# Patient Record
Sex: Male | Born: 1955
Health system: Southern US, Community
[De-identification: ages and names within clinical notes are randomized; demographics above are authoritative.]

## PROBLEM LIST (undated history)

## (undated) DIAGNOSIS — F1990 Other psychoactive substance use, unspecified, uncomplicated: Secondary | ICD-10-CM

## (undated) DIAGNOSIS — I219 Acute myocardial infarction, unspecified: Secondary | ICD-10-CM

## (undated) DIAGNOSIS — E119 Type 2 diabetes mellitus without complications: Secondary | ICD-10-CM

## (undated) DIAGNOSIS — I2510 Atherosclerotic heart disease of native coronary artery without angina pectoris: Secondary | ICD-10-CM

---

## 2019-01-02 ENCOUNTER — Other Ambulatory Visit: Payer: Self-pay

## 2019-01-02 DIAGNOSIS — Z20822 Contact with and (suspected) exposure to covid-19: Secondary | ICD-10-CM

## 2019-01-04 LAB — NOVEL CORONAVIRUS, NAA: SARS-CoV-2, NAA: NOT DETECTED

## 2019-02-04 ENCOUNTER — Inpatient Hospital Stay (HOSPITAL_COMMUNITY)
Admission: EM | Admit: 2019-02-04 | Discharge: 2019-02-06 | DRG: 247 | Disposition: A | Discharge status: Home / Self Care | Payer: Self-pay | Attending: Cardiovascular Disease | Admitting: Cardiovascular Disease

## 2019-02-04 ENCOUNTER — Emergency Department (HOSPITAL_COMMUNITY): Payer: Self-pay

## 2019-02-04 ENCOUNTER — Other Ambulatory Visit: Payer: Self-pay

## 2019-02-04 ENCOUNTER — Encounter (HOSPITAL_COMMUNITY): Payer: Self-pay | Admitting: Emergency Medicine

## 2019-02-04 ENCOUNTER — Ambulatory Visit (HOSPITAL_COMMUNITY)
Admission: EM | Admit: 2019-02-04 | Discharge: 2019-02-04 | Disposition: A | Discharge status: Home / Self Care | Payer: Self-pay | Attending: Family Medicine | Admitting: Family Medicine

## 2019-02-04 DIAGNOSIS — Z955 Presence of coronary angioplasty implant and graft: Secondary | ICD-10-CM

## 2019-02-04 DIAGNOSIS — I214 Non-ST elevation (NSTEMI) myocardial infarction: Principal | ICD-10-CM | POA: Diagnosis present

## 2019-02-04 DIAGNOSIS — Z20828 Contact with and (suspected) exposure to other viral communicable diseases: Secondary | ICD-10-CM | POA: Diagnosis present

## 2019-02-04 DIAGNOSIS — E119 Type 2 diabetes mellitus without complications: Secondary | ICD-10-CM

## 2019-02-04 DIAGNOSIS — F1490 Cocaine use, unspecified, uncomplicated: Secondary | ICD-10-CM | POA: Diagnosis present

## 2019-02-04 DIAGNOSIS — Z23 Encounter for immunization: Secondary | ICD-10-CM

## 2019-02-04 DIAGNOSIS — E785 Hyperlipidemia, unspecified: Secondary | ICD-10-CM

## 2019-02-04 DIAGNOSIS — E1165 Type 2 diabetes mellitus with hyperglycemia: Secondary | ICD-10-CM | POA: Diagnosis present

## 2019-02-04 DIAGNOSIS — Z8249 Family history of ischemic heart disease and other diseases of the circulatory system: Secondary | ICD-10-CM

## 2019-02-04 DIAGNOSIS — R079 Chest pain, unspecified: Secondary | ICD-10-CM

## 2019-02-04 HISTORY — DX: Type 2 diabetes mellitus without complications: E11.9

## 2019-02-04 HISTORY — DX: Atherosclerotic heart disease of native coronary artery without angina pectoris: I25.10

## 2019-02-04 HISTORY — DX: Other psychoactive substance use, unspecified, uncomplicated: F19.90

## 2019-02-04 HISTORY — DX: Acute myocardial infarction, unspecified: I21.9

## 2019-02-04 LAB — BASIC METABOLIC PANEL
Anion gap: 11 (ref 5–15)
BUN: 12 mg/dL (ref 8–23)
CO2: 23 mmol/L (ref 22–32)
Calcium: 9.6 mg/dL (ref 8.9–10.3)
Chloride: 101 mmol/L (ref 98–111)
Creatinine, Ser: 1.15 mg/dL (ref 0.61–1.24)
GFR calc Af Amer: >60 mL/min (ref >60)
GFR calc non Af Amer: >60 mL/min (ref >60)
Glucose, Bld: 289 mg/dL — ABNORMAL HIGH (ref 70–99)
Potassium: 3.8 mmol/L (ref 3.5–5.1)
Sodium: 135 mmol/L (ref 135–145)

## 2019-02-04 LAB — CBC
HCT: 43.8 % (ref 39.0–52.0)
Hemoglobin: 13.5 g/dL (ref 13.0–17.0)
MCH: 27.8 pg (ref 26.0–34.0)
MCHC: 30.8 g/dL (ref 30.0–36.0)
MCV: 90.1 fL (ref 80.0–100.0)
Platelets: 207 10*3/uL (ref 150–400)
RBC: 4.86 MIL/uL (ref 4.22–5.81)
RDW: 11 % — ABNORMAL LOW (ref 11.5–15.5)
WBC: 8.7 10*3/uL (ref 4.0–10.5)
nRBC: 0 % (ref 0.0–0.2)

## 2019-02-04 LAB — TROPONIN I (HIGH SENSITIVITY)
Troponin I (High Sensitivity): >27000 ng/L (ref <18)
Troponin I (High Sensitivity): >27000 ng/L (ref <18)

## 2019-02-04 LAB — CBG MONITORING, ED: Glucose-Capillary: 224 mg/dL — ABNORMAL HIGH (ref 70–99)

## 2019-02-04 MED ORDER — ASPIRIN 300 MG RE SUPP: 300.0000 mg | RECTAL | Status: DC

## 2019-02-04 MED ORDER — ASPIRIN 81 MG PO CHEW
324.0000 mg | CHEWABLE_TABLET | Freq: Once | ORAL | Status: AC
  Administered 2019-02-04: 324 mg via ORAL
  Filled 2019-02-04: qty 4

## 2019-02-04 MED ORDER — HEPARIN BOLUS VIA INFUSION
4000.0000 [IU] | Freq: Once | INTRAVENOUS | Status: AC
  Administered 2019-02-04: 4000 [IU] via INTRAVENOUS
  Filled 2019-02-04: qty 4000

## 2019-02-04 MED ORDER — HEPARIN (PORCINE) 25000 UT/250ML-% IV SOLN
1200.0000 [IU]/h | INTRAVENOUS | Status: DC
  Administered 2019-02-04: 1000 [IU]/h via INTRAVENOUS
  Filled 2019-02-04 (×2): qty 250

## 2019-02-04 MED ORDER — ATORVASTATIN CALCIUM 80 MG PO TABS
80.0000 mg | ORAL_TABLET | Freq: Every day | ORAL | Status: DC
  Administered 2019-02-05 (×2): 80 mg via ORAL
  Filled 2019-02-04 (×2): qty 1

## 2019-02-04 MED ORDER — INSULIN ASPART 100 UNIT/ML ~~LOC~~ SOLN
0.0000 [IU] | Freq: Every day | SUBCUTANEOUS | Status: DC
  Administered 2019-02-05 (×2): 2 [IU] via SUBCUTANEOUS

## 2019-02-04 MED ORDER — NITROGLYCERIN IN D5W 200-5 MCG/ML-% IV SOLN
0.0000 ug/min | INTRAVENOUS | Status: DC
  Administered 2019-02-04: 5 ug/min via INTRAVENOUS
  Filled 2019-02-04: qty 250

## 2019-02-04 MED ORDER — INSULIN ASPART 100 UNIT/ML ~~LOC~~ SOLN
0.0000 [IU] | Freq: Three times a day (TID) | SUBCUTANEOUS | Status: DC
  Administered 2019-02-05: 5 [IU] via SUBCUTANEOUS
  Administered 2019-02-06: 3 [IU] via SUBCUTANEOUS

## 2019-02-04 MED ORDER — ASPIRIN 81 MG PO CHEW: 324.0000 mg | CHEWABLE_TABLET | ORAL | Status: DC

## 2019-02-04 MED ORDER — SODIUM CHLORIDE 0.9% FLUSH
3.0000 mL | Freq: Once | INTRAVENOUS | Status: AC
  Administered 2019-02-04: 3 mL via INTRAVENOUS

## 2019-02-04 MED ORDER — ASPIRIN EC 81 MG PO TBEC
81.0000 mg | DELAYED_RELEASE_TABLET | Freq: Every day | ORAL | Status: DC
  Administered 2019-02-06: 81 mg via ORAL
  Filled 2019-02-04: qty 1

## 2019-02-04 MED ORDER — ONDANSETRON HCL 4 MG/2ML IJ SOLN: 4.0000 mg | Freq: Four times a day (QID) | INTRAMUSCULAR | Status: DC | PRN

## 2019-02-04 MED ORDER — METOPROLOL TARTRATE 12.5 MG HALF TABLET
12.5000 mg | ORAL_TABLET | Freq: Two times a day (BID) | ORAL | Status: DC
  Administered 2019-02-05 – 2019-02-06 (×2): 12.5 mg via ORAL
  Filled 2019-02-04 (×2): qty 1

## 2019-02-04 MED ORDER — INSULIN GLARGINE 100 UNIT/ML ~~LOC~~ SOLN
10.0000 [IU] | Freq: Every day | SUBCUTANEOUS | Status: DC
  Administered 2019-02-05 (×2): 10 [IU] via SUBCUTANEOUS
  Filled 2019-02-04 (×4): qty 0.1

## 2019-02-04 MED ORDER — ACETAMINOPHEN 325 MG PO TABS: 650.0000 mg | ORAL_TABLET | ORAL | Status: DC | PRN

## 2019-02-04 NOTE — ED Provider Notes (Addendum)
[facility] EMERGENCY DEPARTMENT Provider Note   CSN: [id number] Arrival date & time: 02/04/19  1851     History   Chief Complaint Chief Complaint  Patient presents with  . Chest Pain    HPI Adam [name] is a 63 y.o. male who presents to the emergency department from UC with a chief complaint of chest pain.  The patient reports constant, nonradiating, 6/10, pressure-like  pain in the middle and left side of his chest that has been constant since onset yesterday.  The chest pain has been accompanied by shortness of breath and palpitations.  He reports that he was diaphoretic most of yesterday, but this resolved today.    He denies any recent episodes of chest pain with exertion.  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  His chest pain was not worse while he was active and at his job. He notes the only thing that has made his pain improve was drinking water and he has noticed more burping and belching.  He denies neck pain, arm pain, nausea, vomiting, dizziness, lightheadedness, fever, chills, cough, abdominal pain, back pain, or leg swelling.  He reports that he has no medical problems and does not take any medications on a daily basis, but also states that he has not been seen by a medical provider and evaluated in over 40 years.  He has a family history of cardiovascular disease with his mother and brother.  He reports that his brother had several stents placed in his late 12s.     The history is provided by the patient. No language interpreter was used.    History reviewed. No pertinent past medical history.  Patient Active Problem List   Diagnosis Date Noted  . Type 2 diabetes mellitus ([place]) 02/04/2019  . NSTEMI (non-ST elevated myocardial infarction) ([place]) 02/04/2019    History reviewed. No pertinent surgical history.      Home Medications    Prior to Admission medications   Not on File    Family History History reviewed.  No pertinent family history.  Social History Social History   Tobacco Use  . Smoking status: Never Smoker  . Smokeless tobacco: Never Used  Substance Use Topics  . Alcohol use: Not on file  . Drug use: Not on file     Allergies   Patient has no known allergies.   Review of Systems Review of Systems  Constitutional: Negative for appetite change, chills and fever.  Eyes: Negative for visual disturbance.  Respiratory: Positive for shortness of breath.   Cardiovascular: Positive for chest pain and palpitations. Negative for leg swelling.  Gastrointestinal: Negative for abdominal pain, diarrhea, nausea and vomiting.  Genitourinary: Negative for dysuria.  Musculoskeletal: Negative for arthralgias, back pain, myalgias, neck pain and neck stiffness.  Skin: Negative for rash.  Allergic/Immunologic: Negative for immunocompromised state.  Neurological: Negative for dizziness, seizures, syncope, weakness, numbness and headaches.  Psychiatric/Behavioral: Negative for confusion.   Physical Exam Updated Vital Signs BP 124/72 (BP Location: Right Arm)   Pulse 61   Temp 98.4 F (36.9 C) (Oral)   Resp 18   Ht 6\' 3"  (1.905 m)   Wt 80.7 kg   SpO2 98%   BMI 22.25 kg/m   Physical Exam Vitals signs and nursing note reviewed.  Constitutional:      General: He is not in acute distress.    Appearance: He is well-developed. He is not ill-appearing, toxic-appearing or diaphoretic.  Comments: Well appearing  HENT:     Head: Normocephalic.  Eyes:     Conjunctiva/sclera: Conjunctivae normal.  Neck:     Musculoskeletal: Normal range of motion and neck supple.     Vascular: No carotid bruit.  Cardiovascular:     Rate and Rhythm: Normal rate and regular rhythm.     Pulses: Normal pulses.     Heart sounds: Normal heart sounds. No murmur. No friction rub. No gallop.   Pulmonary:     Effort: Pulmonary effort is normal. No respiratory distress.     Breath sounds: No stridor. No wheezing,  rhonchi or rales.  Chest:     Chest wall: No tenderness.  Abdominal:     General: There is no distension.     Palpations: Abdomen is soft.     Comments: Abdomen is obese, but soft and nondistended.  Musculoskeletal:     Right lower leg: No edema.     Left lower leg: No edema.  Skin:    General: Skin is warm and dry.  Neurological:     Mental Status: He is alert.  Psychiatric:        Behavior: Behavior normal.      ED Treatments / Results  Labs (all labs ordered are listed, but only abnormal results are displayed) Labs Reviewed  BASIC METABOLIC PANEL - Abnormal; Notable for the following components:      Result Value   Glucose, Bld 289 (*)    All other components within normal limits  CBC - Abnormal; Notable for the following components:   RDW 11.0 (*)    All other components within normal limits  CBG MONITORING, ED - Abnormal; Notable for the following components:   Glucose-Capillary 224 (*)    All other components within normal limits  TROPONIN I (HIGH SENSITIVITY) - Abnormal; Notable for the following components:   Troponin I (High Sensitivity) >27,000 (*)    All other components within normal limits  TROPONIN I (HIGH SENSITIVITY) - Abnormal; Notable for the following components:   Troponin I (High Sensitivity) >27,000 (*)    All other components within normal limits  SARS CORONAVIRUS 2 (TAT 6-24 HRS)  HEPARIN LEVEL (UNFRACTIONATED)  CBC  HEMOGLOBIN A1C  LIPID PANEL  MAGNESIUM  HEPATIC FUNCTION PANEL  HIV ANTIBODY (ROUTINE TESTING W REFLEX)    EKG None  Radiology Dg Chest 2 View  Result Date: 02/04/2019 CLINICAL DATA:  Chest pain EXAM: CHEST - 2 VIEW COMPARISON:  None. FINDINGS: The heart size and mediastinal contours are within normal limits. Both lungs are clear. The visualized skeletal structures are unremarkable. IMPRESSION: No active cardiopulmonary disease. Electronically Signed   By: Jonna Clark M.D.   On: 02/04/2019 19:52    Procedures .Critical  Care Performed by: Barkley Boards, PA-C Authorized by: Barkley Boards, PA-C   Critical care provider statement:    Critical care time (minutes):  35   Critical care time was exclusive of:  Separately billable procedures and treating other patients and teaching time   Critical care was necessary to treat or prevent imminent or life-threatening deterioration of the following conditions:  Cardiac failure   Critical care was time spent personally by me on the following activities:  Development of treatment plan with patient or surrogate, discussions with consultants, obtaining history from patient or surrogate, examination of patient, evaluation of patient's response to treatment, ordering and performing treatments and interventions, ordering and review of laboratory studies, ordering and review of radiographic studies, re-evaluation  of patient's condition and pulse oximetry   (including critical care time)  Medications Ordered in ED Medications  nitroGLYCERIN 50 mg in dextrose 5 % 250 mL (0.2 mg/mL) infusion (10 mcg/min Intravenous Rate/Dose Change 02/05/19 0007)  heparin ADULT infusion 100 units/mL (25000 units/24450mL sodium chloride 0.45%) (1,000 Units/hr Intravenous New Bag/Given 02/04/19 2302)  insulin glargine (LANTUS) injection 10 Units (10 Units Subcutaneous Given 02/05/19 0003)  insulin aspart (novoLOG) injection 0-15 Units (has no administration in time range)  insulin aspart (novoLOG) injection 0-5 Units (2 Units Subcutaneous Given 02/05/19 0003)  aspirin chewable tablet 324 mg (324 mg Oral Not Given 02/04/19 2324)    Or  aspirin suppository 300 mg ( Rectal See Alternative 02/04/19 2324)  aspirin EC tablet 81 mg (has no administration in time range)  acetaminophen (TYLENOL) tablet 650 mg (has no administration in time range)  ondansetron (ZOFRAN) injection 4 mg (has no administration in time range)  metoprolol tartrate (LOPRESSOR) tablet 12.5 mg (has no administration in time range)   atorvastatin (LIPITOR) tablet 80 mg (80 mg Oral Given 02/05/19 0002)  sodium chloride flush (NS) 0.9 % injection 3 mL (3 mLs Intravenous Given 02/04/19 2306)  aspirin chewable tablet 324 mg (324 mg Oral Given 02/04/19 2300)  heparin bolus via infusion 4,000 Units (4,000 Units Intravenous Bolus from Bag 02/04/19 2302)     Initial Impression / Assessment and Plan / ED Course  I have reviewed the triage vital signs and the nursing notes.  Pertinent labs & imaging results that were available during my care of the patient were reviewed by me and considered in my medical decision making (see chart for details).        63 year old male with no known past medical history, but the patient reports that he has not been evaluated by a medical provider in more than 40 years who presents with chest pain and shortness of breath since yesterday.   Vital signs are stable. EKG without acute evidence of ischemia.  Chest x-ray is unremarkable. Initial troponin is >27K.  Discussed the patient with Dr. Adela LankFloyd, attending physician.  Will initiate aspirin, nitroglycerin infusion, and heparin as presentation is concerning for NSTEMI.  Consulted cardiology and spoke with Dr. Okey Dupreose who will accept the patient for admission.  Glucose is elevated at 289 with normal anion gap and bicarb.  No electrolyte deficiencies. COVID-19 test is pending.  00:01-Patient recheck. Repeat troponin >27K. He is NPO at this time. No change in chest pain- continues to endorse 6/10 chest pain.    The patient appears reasonably stabilized for admission considering the current resources, flow, and capabilities available in the ED at this time, and I doubt any other [facility] requiring further screening and/or treatment in the ED prior to admission.  Final Clinical Impressions(s) / ED Diagnoses   Final diagnoses:  NSTEMI (non-ST elevated myocardial infarction) [facility](HCC)    ED Discharge Orders    None       Christropher Gintz A, PA-C 02/05/19 0012     Jenna Routzahn A, PA-C 02/05/19 0014    Melene PlanFloyd, Dan, DO 02/05/19 1512

## 2019-02-04 NOTE — Progress Notes (Signed)
ANTICOAGULATION CONSULT NOTE  Pharmacy Consult for Heparin Indication: chest pain/ACS  No Known Allergies  Patient Measurements: Height: 6\' 3"  (190.5 cm) Weight: 178 lb (80.7 kg) IBW/kg (Calculated) : 84.5 Heparin Dosing Weight: 80.7 kg  Vital Signs: Temp: 98.4 F (36.9 C) (11/10 2212) Temp Source: Oral (11/10 2212) BP: 124/72 (11/10 2212) Pulse Rate: 61 (11/10 2212)  Labs: Recent Labs    02/04/19 1905  HGB 13.5  HCT 43.8  PLT 207  CREATININE 1.15  TROPONINIHS >27,000*    Estimated Creatinine Clearance: 74.1 mL/min (by C-G formula based on SCr of 1.15 mg/dL).   Medical History: History reviewed. No pertinent past medical history.  Medications:  Scheduled:  . aspirin  324 mg Oral Once  . heparin  4,000 Units Intravenous Once  . sodium chloride flush  3 mL Intravenous Once    Assessment: Patient is a 44 yom that presents to the ED with chest pain. After obtaining labs the patient's Troponin was found to be >27,000. Pharmacy has been consulted to dose heparin in this patient for ACS. Goal of Therapy:  Heparin level 0.3-0.7 units/ml Monitor platelets by anticoagulation protocol: Yes   Plan:  - Heparin bolus of 4000 units IV x 1 dose - Heparin Drip @ 1000 units/hr  - Heparin level in 6 hours (with AM labs)  - Monitor for s/s of bleeding and CBC daily   Duanne Limerick PharmD. BCPS  02/04/2019,10:49 PM

## 2019-02-04 NOTE — ED Notes (Signed)
PA made aware of pt troponin

## 2019-02-04 NOTE — ED Triage Notes (Signed)
Pt sent down from UC with c/o chest pain in the center of his chest , non radiating , no n/v

## 2019-02-04 NOTE — ED Notes (Signed)
Patient is being discharged from the Urgent [facility] and sent to the Emergency Department via significant other. Per Dr. Meda Coffee, patient is stable but in need of higher level of care due to chest pain. Patient is aware and verbalizes understanding of plan of care.  Vitals:   02/04/19 1825  BP: (!) 153/90  Pulse: 81  Resp: 18  Temp: 98.2 F (36.8 C)  SpO2: 100%

## 2019-02-04 NOTE — ED Triage Notes (Addendum)
Pt c/o chest pain, starting last night, states it also feels like theres a fluttering in his chest. Denies medical problems. States he was sweating a lot last night and also c/o sob right now. Pain 5/10

## 2019-02-05 ENCOUNTER — Other Ambulatory Visit (HOSPITAL_COMMUNITY): Payer: Self-pay

## 2019-02-05 ENCOUNTER — Encounter (HOSPITAL_COMMUNITY)
Admission: EM | Disposition: A | Discharge status: Home / Self Care | Payer: Self-pay | Source: Home / Self Care | Attending: Cardiovascular Disease

## 2019-02-05 ENCOUNTER — Other Ambulatory Visit: Payer: Self-pay

## 2019-02-05 DIAGNOSIS — I2510 Atherosclerotic heart disease of native coronary artery without angina pectoris: Secondary | ICD-10-CM

## 2019-02-05 DIAGNOSIS — I214 Non-ST elevation (NSTEMI) myocardial infarction: Secondary | ICD-10-CM

## 2019-02-05 DIAGNOSIS — E119 Type 2 diabetes mellitus without complications: Secondary | ICD-10-CM

## 2019-02-05 HISTORY — PX: CORONARY STENT INTERVENTION: CATH118234

## 2019-02-05 HISTORY — PX: CORONARY BALLOON ANGIOPLASTY: CATH118233

## 2019-02-05 HISTORY — PX: CORONARY THROMBECTOMY: CATH118304

## 2019-02-05 HISTORY — PX: LEFT HEART CATH AND CORONARY ANGIOGRAPHY: CATH118249

## 2019-02-05 LAB — CBC
HCT: 38.7 % — ABNORMAL LOW (ref 39.0–52.0)
Hemoglobin: 12 g/dL — ABNORMAL LOW (ref 13.0–17.0)
MCH: 28 pg (ref 26.0–34.0)
MCHC: 31 g/dL (ref 30.0–36.0)
MCV: 90.2 fL (ref 80.0–100.0)
Platelets: 191 10*3/uL (ref 150–400)
RBC: 4.29 MIL/uL (ref 4.22–5.81)
RDW: 11.2 % — ABNORMAL LOW (ref 11.5–15.5)
WBC: 7.9 10*3/uL (ref 4.0–10.5)
nRBC: 0 % (ref 0.0–0.2)

## 2019-02-05 LAB — HIV ANTIBODY (ROUTINE TESTING W REFLEX): HIV Screen 4th Generation wRfx: NONREACTIVE

## 2019-02-05 LAB — POCT ACTIVATED CLOTTING TIME
Activated Clotting Time: 241 seconds
Activated Clotting Time: 263 seconds

## 2019-02-05 LAB — RAPID URINE DRUG SCREEN, HOSP PERFORMED
Amphetamines: NOT DETECTED
Barbiturates: NOT DETECTED
Benzodiazepines: NOT DETECTED
Cocaine: POSITIVE — AB
Opiates: NOT DETECTED
Tetrahydrocannabinol: NOT DETECTED

## 2019-02-05 LAB — CBG MONITORING, ED: Glucose-Capillary: 208 mg/dL — ABNORMAL HIGH (ref 70–99)

## 2019-02-05 LAB — SARS CORONAVIRUS 2 (TAT 6-24 HRS): SARS Coronavirus 2: NEGATIVE

## 2019-02-05 LAB — GLUCOSE, CAPILLARY: Glucose-Capillary: 219 mg/dL — ABNORMAL HIGH (ref 70–99)

## 2019-02-05 LAB — HEPARIN LEVEL (UNFRACTIONATED): Heparin Unfractionated: 0.13 IU/mL — ABNORMAL LOW (ref 0.30–0.70)

## 2019-02-05 SURGERY — LEFT HEART CATH AND CORONARY ANGIOGRAPHY
Anesthesia: LOCAL

## 2019-02-05 MED ORDER — TICAGRELOR 90 MG PO TABS
ORAL_TABLET | ORAL | Status: AC
  Filled 2019-02-05: qty 2

## 2019-02-05 MED ORDER — FENTANYL CITRATE (PF) 100 MCG/2ML IJ SOLN
INTRAMUSCULAR | Status: DC | PRN
  Administered 2019-02-05: 25 ug via INTRAVENOUS

## 2019-02-05 MED ORDER — SODIUM CHLORIDE 0.9 % WEIGHT BASED INFUSION
1.0000 mL/kg/h | INTRAVENOUS | Status: DC
  Administered 2019-02-05: 1 mL/kg/h via INTRAVENOUS

## 2019-02-05 MED ORDER — TIROFIBAN HCL IN NACL 5-0.9 MG/100ML-% IV SOLN
INTRAVENOUS | Status: AC
  Filled 2019-02-05: qty 100

## 2019-02-05 MED ORDER — SODIUM CHLORIDE 0.9% FLUSH
3.0000 mL | Freq: Two times a day (BID) | INTRAVENOUS | Status: DC
  Administered 2019-02-05: 3 mL via INTRAVENOUS

## 2019-02-05 MED ORDER — SODIUM CHLORIDE 0.9% FLUSH: 3.0000 mL | INTRAVENOUS | Status: DC | PRN

## 2019-02-05 MED ORDER — TICAGRELOR 90 MG PO TABS
ORAL_TABLET | ORAL | Status: DC | PRN
  Administered 2019-02-05: 180 mg via ORAL

## 2019-02-05 MED ORDER — TICAGRELOR 90 MG PO TABS
90.0000 mg | ORAL_TABLET | Freq: Two times a day (BID) | ORAL | Status: DC
  Administered 2019-02-05 – 2019-02-06 (×2): 90 mg via ORAL
  Filled 2019-02-05 (×2): qty 1

## 2019-02-05 MED ORDER — SODIUM CHLORIDE 0.9 % IV SOLN: 250.0000 mL | INTRAVENOUS | Status: DC | PRN

## 2019-02-05 MED ORDER — TIROFIBAN (AGGRASTAT) BOLUS VIA INFUSION
INTRAVENOUS | Status: DC | PRN
  Administered 2019-02-05: 2017.5 ug via INTRAVENOUS

## 2019-02-05 MED ORDER — HEPARIN (PORCINE) IN NACL 1000-0.9 UT/500ML-% IV SOLN
INTRAVENOUS | Status: AC
  Filled 2019-02-05: qty 1000

## 2019-02-05 MED ORDER — FENTANYL CITRATE (PF) 100 MCG/2ML IJ SOLN
INTRAMUSCULAR | Status: AC
  Filled 2019-02-05: qty 2

## 2019-02-05 MED ORDER — INFLUENZA VAC SPLIT QUAD 0.5 ML IM SUSY
0.5000 mL | PREFILLED_SYRINGE | INTRAMUSCULAR | Status: AC
  Administered 2019-02-06: 0.5 mL via INTRAMUSCULAR
  Filled 2019-02-05: qty 0.5

## 2019-02-05 MED ORDER — TIROFIBAN HCL IN NACL 5-0.9 MG/100ML-% IV SOLN
0.1500 ug/kg/min | INTRAVENOUS | Status: AC
  Administered 2019-02-05: 0.15 ug/kg/min via INTRAVENOUS
  Filled 2019-02-05 (×3): qty 100

## 2019-02-05 MED ORDER — VERAPAMIL HCL 2.5 MG/ML IV SOLN
INTRAVENOUS | Status: DC | PRN
  Administered 2019-02-05: 10 mL via INTRA_ARTERIAL

## 2019-02-05 MED ORDER — VERAPAMIL HCL 2.5 MG/ML IV SOLN
INTRAVENOUS | Status: AC
  Filled 2019-02-05: qty 2

## 2019-02-05 MED ORDER — ASPIRIN 81 MG PO CHEW
81.0000 mg | CHEWABLE_TABLET | ORAL | Status: AC
  Administered 2019-02-05: 81 mg via ORAL
  Filled 2019-02-05: qty 1

## 2019-02-05 MED ORDER — HEPARIN (PORCINE) IN NACL 1000-0.9 UT/500ML-% IV SOLN
INTRAVENOUS | Status: DC | PRN
  Administered 2019-02-05 (×2): 500 mL

## 2019-02-05 MED ORDER — MIDAZOLAM HCL 2 MG/2ML IJ SOLN
INTRAMUSCULAR | Status: DC | PRN
  Administered 2019-02-05: 1 mg via INTRAVENOUS

## 2019-02-05 MED ORDER — HEPARIN SODIUM (PORCINE) 1000 UNIT/ML IJ SOLN
INTRAMUSCULAR | Status: AC
  Filled 2019-02-05: qty 1

## 2019-02-05 MED ORDER — LIDOCAINE HCL (PF) 1 % IJ SOLN
INTRAMUSCULAR | Status: DC | PRN
  Administered 2019-02-05: 2 mL

## 2019-02-05 MED ORDER — HEPARIN BOLUS VIA INFUSION
2000.0000 [IU] | Freq: Once | INTRAVENOUS | Status: AC
  Administered 2019-02-05: 2000 [IU] via INTRAVENOUS
  Filled 2019-02-05: qty 2000

## 2019-02-05 MED ORDER — HEPARIN SODIUM (PORCINE) 1000 UNIT/ML IJ SOLN
INTRAMUSCULAR | Status: DC | PRN
  Administered 2019-02-05: 5000 [IU] via INTRAVENOUS
  Administered 2019-02-05: 3000 [IU] via INTRAVENOUS
  Administered 2019-02-05: 4000 [IU] via INTRAVENOUS

## 2019-02-05 MED ORDER — ZOLPIDEM TARTRATE 5 MG PO TABS
5.0000 mg | ORAL_TABLET | Freq: Every evening | ORAL | Status: DC | PRN
  Administered 2019-02-05: 5 mg via ORAL
  Filled 2019-02-05: qty 1

## 2019-02-05 MED ORDER — LIDOCAINE HCL (PF) 1 % IJ SOLN
INTRAMUSCULAR | Status: AC
  Filled 2019-02-05: qty 30

## 2019-02-05 MED ORDER — SODIUM CHLORIDE 0.9 % IV SOLN
INTRAVENOUS | Status: AC | PRN
  Administered 2019-02-05: 250 mL via INTRAVENOUS

## 2019-02-05 MED ORDER — MIDAZOLAM HCL 2 MG/2ML IJ SOLN
INTRAMUSCULAR | Status: AC
  Filled 2019-02-05: qty 2

## 2019-02-05 MED ORDER — TIROFIBAN HCL IN NACL 5-0.9 MG/100ML-% IV SOLN
INTRAVENOUS | Status: AC | PRN
  Administered 2019-02-05: 0.15 ug/kg/min via INTRAVENOUS

## 2019-02-05 MED ORDER — SODIUM CHLORIDE 0.9 % WEIGHT BASED INFUSION: 3.0000 mL/kg/h | INTRAVENOUS | Status: DC

## 2019-02-05 MED ORDER — SODIUM CHLORIDE 0.9 % IV SOLN: INTRAVENOUS | Status: AC

## 2019-02-05 SURGICAL SUPPLY — 17 items
BALLN SAPPHIRE 2.5X15 (BALLOONS) ×2
BALLOON SAPPHIRE 2.5X15 (BALLOONS) IMPLANT
CATH EXTRAC PRONTO 5.5F 138CM (CATHETERS) ×1 IMPLANT
CATH INFINITI JR4 5F (CATHETERS) ×1 IMPLANT
CATH OPTITORQUE TIG 4.0 5F (CATHETERS) ×1 IMPLANT
CATH VISTA GUIDE 6FR XB3.5 (CATHETERS) ×1 IMPLANT
DEVICE RAD COMP TR BAND LRG (VASCULAR PRODUCTS) ×1 IMPLANT
GLIDESHEATH SLEND SS 6F .021 (SHEATH) ×1 IMPLANT
GUIDEWIRE INQWIRE 1.5J.035X260 (WIRE) IMPLANT
INQWIRE 1.5J .035X260CM (WIRE) ×2
KIT ENCORE 26 ADVANTAGE (KITS) ×1 IMPLANT
KIT HEART LEFT (KITS) ×2 IMPLANT
PACK CARDIAC CATHETERIZATION (CUSTOM PROCEDURE TRAY) ×2 IMPLANT
STENT RESOLUTE ONYX 3.0X22 (Permanent Stent) ×1 IMPLANT
TRANSDUCER W/STOPCOCK (MISCELLANEOUS) ×2 IMPLANT
TUBING CIL FLEX 10 FLL-RA (TUBING) ×2 IMPLANT
WIRE ASAHI PROWATER 180CM (WIRE) ×1 IMPLANT

## 2019-02-05 NOTE — Progress Notes (Signed)
Adam [name] for aggrastat Indication: chest pain/ACS  No Known Allergies  Patient Measurements: Height: 6\' 3"  (190.5 cm) Weight: 178 lb (80.7 kg) IBW/kg (Calculated) : 84.5 Heparin Dosing Weight: 80.7 kg  Vital Signs: BP: 89/65 (11/11 1515) Pulse Rate: 59 (11/11 1515)  Labs: Recent Labs    02/04/19 1905 02/04/19 2200 02/05/19 0500  HGB 13.5  --  12.0*  HCT 43.8  --  38.7*  PLT 207  --  191  HEPARINUNFRC  --   --  0.13*  CREATININE 1.15  --   --   TROPONINIHS >27,000* >27,000*  --     Estimated Creatinine Clearance: 74.1 mL/min (by C-G formula based on SCr of 1.15 mg/dL).   Medical History: History reviewed. No pertinent past medical history.  Medications:  Scheduled:  . [MAR Hold] aspirin  324 mg Oral NOW   Or  . [MAR Hold] aspirin  300 mg Rectal NOW  . [MAR Hold] aspirin EC  81 mg Oral Daily  . [MAR Hold] atorvastatin  80 mg Oral q1800  . [MAR Hold] insulin aspart  0-15 Units Subcutaneous TID WC  . [MAR Hold] insulin aspart  0-5 Units Subcutaneous QHS  . [MAR Hold] insulin glargine  10 Units Subcutaneous QHS  . [MAR Hold] metoprolol tartrate  12.5 mg Oral BID  . sodium chloride flush  3 mL Intravenous Q12H    Assessment: Patient is a 8 yom that presents to the ED with chest pain. After obtaining labs the patient's Troponin was found to be >27,000. Pharmacy has been consulted to dose heparin in this patient for ACS.  Underwent cardiac cath 11/11 finding severe 3 vessel dx with DES placed in pLCx. Plan for aggrastat for 12 hours after to improve distal flow. Hgb 12, plt 191. No s/sx of bleeding.   Goal of Therapy:  Monitor platelets by anticoagulation protocol: Yes   Plan:  -Continue tirofiban 0.15 mcg/kg/hr for 12 hours total post-cath (stop time entered).  -Monitor CBC, and for s/sx of bleeding.  Antonietta Jewel, PharmD, BCCCP Clinical Pharmacist  Phone: [phone]  Please check AMION for all [place] phone  numbers After 10:00 PM, call [place] [phone]

## 2019-02-05 NOTE — H&P (View-Only) (Signed)
Progress Note  Patient Name: Adam [name] Date of Encounter: 02/05/2019  Primary Cardiologist: No primary care provider on file.   Subjective   No chest pain this morning. Remains on nitro gtt.   Inpatient Medications    Scheduled Meds: . aspirin  324 mg Oral NOW   Or  . aspirin  300 mg Rectal NOW  . aspirin EC  81 mg Oral Daily  . atorvastatin  80 mg Oral q1800  . insulin aspart  0-15 Units Subcutaneous TID WC  . insulin aspart  0-5 Units Subcutaneous QHS  . insulin glargine  10 Units Subcutaneous QHS  . metoprolol tartrate  12.5 mg Oral BID   Continuous Infusions: . heparin 1,200 Units/hr (02/05/19 0702)  . nitroGLYCERIN 10 mcg/min (02/05/19 0734)   PRN Meds: acetaminophen, ondansetron (ZOFRAN) IV   Vital Signs    Vitals:   02/05/19 0330 02/05/19 0415 02/05/19 0730 02/05/19 0732  BP: 90/70 97/64 (!) 84/61 (!) 84/56  Pulse: 60 70 66 63  Resp: (!) 24 (!) 22  20  Temp:      TempSrc:      SpO2: 97% 94% 97% 91%  Weight:      Height:       No intake or output data in the 24 hours ending 02/05/19 0743 Last 3 Weights 02/04/2019  Weight (lbs) 178 lb  Weight (kg) 80.74 kg      Telemetry    SR - Personally Reviewed  ECG    No new tracing this morning.   Physical Exam  [name], sitting up in bed.  GEN: No acute distress.   Neck: No JVD Cardiac: RRR, no murmurs, rubs, or gallops.  Respiratory: Clear to auscultation bilaterally. GI: Soft, nontender, non-distended  MS: No edema; No deformity. Neuro:  Nonfocal  Psych: Normal affect   Labs    High Sensitivity Troponin:   Recent Labs  Lab 02/04/19 1905 02/04/19 2200  TROPONINIHS >27,000* >27,000*      Chemistry Recent Labs  Lab 02/04/19 1905  NA 135  K 3.8  CL 101  CO2 23  GLUCOSE 289*  BUN 12  CREATININE 1.15  CALCIUM 9.6  GFRNONAA >60  GFRAA >60  ANIONGAP 11     Hematology Recent Labs  Lab 02/04/19 1905 02/05/19 0500  WBC 8.7 7.9  RBC 4.86 4.29  HGB 13.5 12.0*  HCT  43.8 38.7*  MCV 90.1 90.2  MCH 27.8 28.0  MCHC 30.8 31.0  RDW 11.0* 11.2*  PLT 207 191    BNPNo results for input(s): BNP, PROBNP in the last 168 hours.   DDimer No results for input(s): DDIMER in the last 168 hours.   Radiology    Dg Chest 2 View  Result Date: 02/04/2019 CLINICAL DATA:  Chest pain EXAM: CHEST - 2 VIEW COMPARISON:  None. FINDINGS: The heart size and mediastinal contours are within normal limits. Both lungs are clear. The visualized skeletal structures are unremarkable. IMPRESSION: No active cardiopulmonary disease. Electronically Signed   By: Jonna Clark M.D.   On: 02/04/2019 19:52    Cardiac Studies   N/a   Patient Profile     63 y.o. male with no reported PMH who presented with chest pain and found to have a NSTEMI.   Assessment & Plan    1. NSTEMI: HsT peaked at > 27000 x2. Reports his chest pain started on Tuesday and lingered until he presented to the ED yesterday. Remains on IV heparin and nitro. Currently pain  free. Planned for cath today.  -- The patient understands that risks included but are not limited to stroke (1 in 1000), death (1 in 24), kidney failure [usually temporary] (1 in 500), bleeding (1 in 200), allergic reaction [possibly serious] (1 in 200).  -- on ASA, statin, BB (currently held with soft bps)  2. Elevated glucose: denies any hx of DM. Though does not see a PCP.  -- SSI while inpatient -- lipids pending  3. Cocaine use: reports occasional use. Last used about a week ago.  -- check UDS   For questions or updates, please contact [place] Please consult [email or web address] for contact info under   Signed, Reino Bellis, NP  02/05/2019, 7:43 AM    Patient seen, examined. Available data reviewed. Agree with findings, assessment, and plan as outlined by Reino Bellis, NP.  See full H&P by the fellow overnight.  The patient is chest pain-free this morning.  He had a prolonged episode of chest pain leading to his admission,  approximately 24 hours.  He has had a large infarct by cardiac enzymes with high-sensitivity troponin greater than 27,000x2.  I agree that cardiac catheterization is indicated.  We discussed risks, indications, and alternatives as outlined above.  We discussed his need for regular medical care, treatment of diabetes, medical therapy, and complete cessation of drug use.  I tried to call his son but did not get an answer.  The patient is posted for cardiac catheterization and possible PCI today.  All of his questions are answered.  Sherren Mocha, M.D. 02/05/2019 8:39 AM

## 2019-02-05 NOTE — Progress Notes (Signed)
ANTICOAGULATION CONSULT NOTE  Pharmacy Consult for Heparin Indication: chest pain/ACS  No Known Allergies  Patient Measurements: Height: 6\' 3"  (190.5 cm) Weight: 178 lb (80.7 kg) IBW/kg (Calculated) : 84.5 Heparin Dosing Weight: 80.7 kg  Vital Signs: Temp: 98.4 F (36.9 C) (11/10 2212) Temp Source: Oral (11/10 2212) BP: 97/64 (11/11 0415) Pulse Rate: 70 (11/11 0415)  Labs: Recent Labs    02/04/19 1905 02/04/19 2200 02/05/19 0500  HGB 13.5  --  12.0*  HCT 43.8  --  38.7*  PLT 207  --  191  HEPARINUNFRC  --   --  0.13*  CREATININE 1.15  --   --   TROPONINIHS >27,000* >27,000*  --     Estimated Creatinine Clearance: 74.1 mL/min (by C-G formula based on SCr of 1.15 mg/dL).   Medical History: History reviewed. No pertinent past medical history.  Medications:  Scheduled:  . aspirin  324 mg Oral NOW   Or  . aspirin  300 mg Rectal NOW  . aspirin EC  81 mg Oral Daily  . atorvastatin  80 mg Oral q1800  . heparin  2,000 Units Intravenous Once  . insulin aspart  0-15 Units Subcutaneous TID WC  . insulin aspart  0-5 Units Subcutaneous QHS  . insulin glargine  10 Units Subcutaneous QHS  . metoprolol tartrate  12.5 mg Oral BID    Assessment: Patient is a 9 yom that presents to the ED with chest pain. After obtaining labs the patient's Troponin was found to be >27,000. Pharmacy has been consulted to dose heparin in this patient for ACS.  11/11 AM update:  Heparin level low No issues per RN  Goal of Therapy:  Heparin level 0.3-0.7 units/ml Monitor platelets by anticoagulation protocol: Yes   Plan:  -Heparin 2000 units bolus -Inc heparin drip to 1200 units/hr -Re-check heparin level at [place], PharmD, [place] Pharmacist Phone: [phone]

## 2019-02-05 NOTE — ED Notes (Signed)
Paper consent for cardiac cath signed by pt and witnessed.

## 2019-02-05 NOTE — Interval H&P Note (Signed)
History and Physical Interval Note:  02/05/2019 10:37 AM  Adam [name]  has presented today for surgery, with the diagnosis of nonstemi.  The various methods of treatment have been discussed with the patient and family. After consideration of risks, benefits and other options for treatment, the patient has consented to  Procedure(s): LEFT HEART CATH AND CORONARY ANGIOGRAPHY (N/A)  PERCUTANEOUS CORONARY INTERVENTION  as a surgical intervention.  The patient's history has been reviewed, patient examined, no change in status, stable for surgery.  I have reviewed the patient's chart and labs.  Questions were answered to the patient's satisfaction.   Cath Lab Visit (complete for each Cath Lab visit)  Clinical Evaluation Leading to the Procedure:   ACS: Yes.    Non-ACS:    Anginal Classification: CCS IV  Anti-ischemic medical therapy: Minimal Therapy (1 class of medications)  Non-Invasive Test Results: No non-invasive testing performed  Prior CABG: No previous CABG    Glenetta Hew

## 2019-02-05 NOTE — ED Notes (Signed)
Spoke with cath lab, ready for pt at this time.

## 2019-02-05 NOTE — Progress Notes (Addendum)
Progress Note  Patient Name: Adam [name] Date of Encounter: 02/05/2019  Primary Cardiologist: No primary care provider on file.   Subjective   No chest pain this morning. Remains on nitro gtt.   Inpatient Medications    Scheduled Meds: . aspirin  324 mg Oral NOW   Or  . aspirin  300 mg Rectal NOW  . aspirin EC  81 mg Oral Daily  . atorvastatin  80 mg Oral q1800  . insulin aspart  0-15 Units Subcutaneous TID WC  . insulin aspart  0-5 Units Subcutaneous QHS  . insulin glargine  10 Units Subcutaneous QHS  . metoprolol tartrate  12.5 mg Oral BID   Continuous Infusions: . heparin 1,200 Units/hr (02/05/19 0702)  . nitroGLYCERIN 10 mcg/min (02/05/19 0734)   PRN Meds: acetaminophen, ondansetron (ZOFRAN) IV   Vital Signs    Vitals:   02/05/19 0330 02/05/19 0415 02/05/19 0730 02/05/19 0732  BP: 90/70 97/64 (!) 84/61 (!) 84/56  Pulse: 60 70 66 63  Resp: (!) 24 (!) 22  20  Temp:      TempSrc:      SpO2: 97% 94% 97% 91%  Weight:      Height:       No intake or output data in the 24 hours ending 02/05/19 0743 Last 3 Weights 02/04/2019  Weight (lbs) 178 lb  Weight (kg) 80.74 kg      Telemetry    SR - Personally Reviewed  ECG    No new tracing this morning.   Physical Exam  [name], sitting up in bed.  GEN: No acute distress.   Neck: No JVD Cardiac: RRR, no murmurs, rubs, or gallops.  Respiratory: Clear to auscultation bilaterally. GI: Soft, nontender, non-distended  MS: No edema; No deformity. Neuro:  Nonfocal  Psych: Normal affect   Labs    High Sensitivity Troponin:   Recent Labs  Lab 02/04/19 1905 02/04/19 2200  TROPONINIHS >27,000* >27,000*      Chemistry Recent Labs  Lab 02/04/19 1905  NA 135  K 3.8  CL 101  CO2 23  GLUCOSE 289*  BUN 12  CREATININE 1.15  CALCIUM 9.6  GFRNONAA >60  GFRAA >60  ANIONGAP 11     Hematology Recent Labs  Lab 02/04/19 1905 02/05/19 0500  WBC 8.7 7.9  RBC 4.86 4.29  HGB 13.5 12.0*  HCT  43.8 38.7*  MCV 90.1 90.2  MCH 27.8 28.0  MCHC 30.8 31.0  RDW 11.0* 11.2*  PLT 207 191    BNPNo results for input(s): BNP, PROBNP in the last 168 hours.   DDimer No results for input(s): DDIMER in the last 168 hours.   Radiology    Dg Chest 2 View  Result Date: 02/04/2019 CLINICAL DATA:  Chest pain EXAM: CHEST - 2 VIEW COMPARISON:  None. FINDINGS: The heart size and mediastinal contours are within normal limits. Both lungs are clear. The visualized skeletal structures are unremarkable. IMPRESSION: No active cardiopulmonary disease. Electronically Signed   By: Jonna Clark M.D.   On: 02/04/2019 19:52    Cardiac Studies   N/a   Patient Profile     63 y.o. male with no reported PMH who presented with chest pain and found to have a NSTEMI.   Assessment & Plan    1. NSTEMI: HsT peaked at > 27000 x2. Reports his chest pain started on Tuesday and lingered until he presented to the ED yesterday. Remains on IV heparin and nitro. Currently pain  free. Planned for cath today.  -- The patient understands that risks included but are not limited to stroke (1 in 1000), death (1 in 24), kidney failure [usually temporary] (1 in 500), bleeding (1 in 200), allergic reaction [possibly serious] (1 in 200).  -- on ASA, statin, BB (currently held with soft bps)  2. Elevated glucose: denies any hx of DM. Though does not see a PCP.  -- SSI while inpatient -- lipids pending  3. Cocaine use: reports occasional use. Last used about a week ago.  -- check UDS   For questions or updates, please contact [place] Please consult [email or web address] for contact info under   Signed, Reino Bellis, NP  02/05/2019, 7:43 AM    Patient seen, examined. Available data reviewed. Agree with findings, assessment, and plan as outlined by Reino Bellis, NP.  See full H&P by the fellow overnight.  The patient is chest pain-free this morning.  He had a prolonged episode of chest pain leading to his admission,  approximately 24 hours.  He has had a large infarct by cardiac enzymes with high-sensitivity troponin greater than 27,000x2.  I agree that cardiac catheterization is indicated.  We discussed risks, indications, and alternatives as outlined above.  We discussed his need for regular medical care, treatment of diabetes, medical therapy, and complete cessation of drug use.  I tried to call his son but did not get an answer.  The patient is posted for cardiac catheterization and possible PCI today.  All of his questions are answered.  Sherren Mocha, M.D. 02/05/2019 8:39 AM

## 2019-02-05 NOTE — H&P (Signed)
Cardiology History & Physical    Patient ID: Adam [name] MRN: [id number], DOB/AGE: 1955-05-19   Admit date: 02/04/2019  Primary Physician: Patient, No Pcp Per Primary Cardiologist: None  Patient Profile    Adam [name] is a 63 year old man with no known medical problems who presents with chest pain since yesterday.  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 Chest pain did not change with exertion but did have worsening dyspnea. No pleuritic or positional. Denies n/v, cough, diarrhea, fever, chills.  He does admit to cocaine use occasionally, about 20 times over the past year, most recently 1 week ago. He has never had chest pain before this episode and is adamant that he last used cocaine 1 week ago.   On arrival here had ongoing chest pain. ECG showed non-specific ST-T changes, not particularly ischemic. Troponin however is > 27,000 x 2. Repeat ECG shows developing TWI in anterolateral leads. Started on nitro ggt and is now chest pain free. He has received full dose aspirin and heparin.   Past Medical History   History reviewed. No pertinent past medical history.   History reviewed. No pertinent surgical history.   Allergies: No Known Allergies  Home Medications    No home medications  Family History    Brother had CAD, multiple stents starting in mid-late 21s. Mother had murmur and hole in her heart, told to avoid pregnancy but had 9 kids.  Social History    No history of tobacco use. Cocaine about 20 times over the past year, most recently 1 week ago. No significant alcohol use.  Single.   Review of Systems    A comprehensive review of 10 systems was performed with pertinent positive and negative findings noted in the HPI.   Physical Exam    BP 124/72 (BP Location: Right Arm)   Pulse 61   Temp 98.4 F  (36.9 C) (Oral)   Resp 18   Ht 6\' 3"  (1.905 m)   Wt 80.7 kg   SpO2 98%   BMI 22.25 kg/m  General: Alert, NAD HEENT: Normal  Neck: No bruits or JVD. Lungs:  Resp regular and unlabored, CTA bilaterally. Heart: Regular rhythm, no s3, s4, or murmurs. Abdomen: Soft, non-tender, non-distended, BS +.  Extremities: Warm. No clubbing, cyanosis or edema. DP/PT/Radials 2+ and equal bilaterally. Psych: Normal affect. Neuro: Alert and oriented. No gross focal deficits. No abnormal movements.  Labs    HS-troponin: > 27,000  Lab Results  Component Value Date   WBC 8.7 02/04/2019   HGB 13.5 02/04/2019   HCT 43.8 02/04/2019   MCV 90.1 02/04/2019   PLT 207 02/04/2019    Recent Labs  Lab 02/04/19 1905  NA 135  K 3.8  CL 101  CO2 23  BUN 12  CREATININE 1.15  CALCIUM 9.6  GLUCOSE 289*     Radiology Studies    Dg Chest 2 View  Result Date: 02/04/2019 CLINICAL DATA:  Chest pain EXAM: CHEST - 2 VIEW COMPARISON:  None. FINDINGS: The heart size and mediastinal contours are within normal limits. Both lungs are clear. The visualized skeletal structures are unremarkable. IMPRESSION: No active cardiopulmonary disease. Electronically Signed   By: Prudencio Pair M.D.   On: 02/04/2019 19:52    ECG & Cardiac Imaging    ECG: NSR, single PAC, LAD, non-specific ST-T abnormality, cannot  rule out inferior infarct, though interpretation somewhat limited by baseline artifact - personally reviewed.  Assessment & Plan    NSTEMI: Typical presentation for ACS. I think this is a type 1 event, and not specifically cocaine-related. He is now chest pain free with nitro ggt. Troponin is > 27,000.  Does have risk factors with a family history and and undiagnosed diabetes.  - Keep NPO for left heart cath in the morning - Monitor on telemetry, serial ECGs - TTE, A1c, lipid profile - Full dose ASA, then 81mg  daily - Agree with heparin, with bolus - Start nitroglycerin infusion, titrate for chest pain relief  - Atorvastatin 80mg  - Metoprolol 12.5mg  BID - Can stop trending troponin as he has 2 at maximum reported value  Type 2 DM:  New diagnosis based on initial glucose of 289. No prior screening. - Check A1c - Start Lantus and SSI - Good candidate for SGLT2i-Metformin combination therapy at discharge. - Consider ACEi this admission, but will hold off for NTG titration for now.   Nutrition: NPO for procedure DVT ppx: therapeutic heparin Advanced Care Planning: Full Code   Signed, , MD 02/05/2019, 12:07 AM

## 2019-02-05 NOTE — ED Notes (Signed)
Cardiology at bedside.

## 2019-02-05 NOTE — Progress Notes (Signed)
TR  BAND REMOVAL  LOCATION: Right Radial  DEFLATED PER PROTOCOL:    Yes  TIME BAND OFF / DRESSING APPLIED:    1610pm Covered with gauze ,tegaderm and coban   SITE UPON ARRIVAL:    Level 0  SITE AFTER BAND REMOVAL:    Level 0  CIRCULATION SENSATION AND MOVEMENT:    Within Normal Limits  Yes  COMMENTS:   Pt denies any discomfort .  Care instruction given to pt.

## 2019-02-06 ENCOUNTER — Encounter (HOSPITAL_COMMUNITY): Payer: Self-pay | Admitting: Cardiology

## 2019-02-06 ENCOUNTER — Telehealth: Payer: Self-pay | Admitting: Cardiovascular Disease

## 2019-02-06 ENCOUNTER — Inpatient Hospital Stay (HOSPITAL_COMMUNITY): Payer: Self-pay

## 2019-02-06 ENCOUNTER — Other Ambulatory Visit: Payer: Self-pay

## 2019-02-06 DIAGNOSIS — E785 Hyperlipidemia, unspecified: Secondary | ICD-10-CM

## 2019-02-06 DIAGNOSIS — I361 Nonrheumatic tricuspid (valve) insufficiency: Secondary | ICD-10-CM

## 2019-02-06 DIAGNOSIS — I340 Nonrheumatic mitral (valve) insufficiency: Secondary | ICD-10-CM

## 2019-02-06 LAB — CBC
HCT: 38.5 % — ABNORMAL LOW (ref 39.0–52.0)
Hemoglobin: 12.2 g/dL — ABNORMAL LOW (ref 13.0–17.0)
MCH: 28 pg (ref 26.0–34.0)
MCHC: 31.7 g/dL (ref 30.0–36.0)
MCV: 88.5 fL (ref 80.0–100.0)
Platelets: 186 10*3/uL (ref 150–400)
RBC: 4.35 MIL/uL (ref 4.22–5.81)
RDW: 11 % — ABNORMAL LOW (ref 11.5–15.5)
WBC: 9.6 10*3/uL (ref 4.0–10.5)
nRBC: 0 % (ref 0.0–0.2)

## 2019-02-06 LAB — BASIC METABOLIC PANEL
Anion gap: 10 (ref 5–15)
BUN: 10 mg/dL (ref 8–23)
CO2: 22 mmol/L (ref 22–32)
Calcium: 9.2 mg/dL (ref 8.9–10.3)
Chloride: 107 mmol/L (ref 98–111)
Creatinine, Ser: 1.15 mg/dL (ref 0.61–1.24)
GFR calc Af Amer: >60 mL/min (ref >60)
GFR calc non Af Amer: >60 mL/min (ref >60)
Glucose, Bld: 189 mg/dL — ABNORMAL HIGH (ref 70–99)
Potassium: 3.8 mmol/L (ref 3.5–5.1)
Sodium: 139 mmol/L (ref 135–145)

## 2019-02-06 LAB — ECHOCARDIOGRAM COMPLETE
Height: 75 in
Weight: 3286.4 oz

## 2019-02-06 LAB — HEMOGLOBIN A1C
Hgb A1c MFr Bld: 8.4 % — ABNORMAL HIGH (ref 4.8–5.6)
Mean Plasma Glucose: 194.38 mg/dL

## 2019-02-06 LAB — GLUCOSE, CAPILLARY
Glucose-Capillary: 165 mg/dL — ABNORMAL HIGH (ref 70–99)
Glucose-Capillary: 234 mg/dL — ABNORMAL HIGH (ref 70–99)

## 2019-02-06 MED ORDER — NITROGLYCERIN 0.4 MG SL SUBL: 0.4000 mg | SUBLINGUAL_TABLET | SUBLINGUAL | 2 refills | Status: DC | PRN

## 2019-02-06 MED ORDER — LIVING WELL WITH DIABETES BOOK
Freq: Once | Status: DC
  Filled 2019-02-06: qty 1

## 2019-02-06 MED ORDER — METOPROLOL TARTRATE 25 MG PO TABS: 12.5000 mg | ORAL_TABLET | Freq: Two times a day (BID) | ORAL | 2 refills | Status: DC

## 2019-02-06 MED ORDER — ASPIRIN 81 MG PO TBEC: 81.0000 mg | DELAYED_RELEASE_TABLET | Freq: Every day | ORAL | 3 refills | Status: AC

## 2019-02-06 MED ORDER — TICAGRELOR 90 MG PO TABS: 90.0000 mg | ORAL_TABLET | Freq: Two times a day (BID) | ORAL | 2 refills | Status: DC

## 2019-02-06 MED ORDER — ATORVASTATIN CALCIUM 80 MG PO TABS: 80.0000 mg | ORAL_TABLET | Freq: Every day | ORAL | 3 refills | Status: DC

## 2019-02-06 MED FILL — ASPIRIN 81MG ADULT LOW STRE: 81 | 90 days supply | Qty: 90 | Fill #0

## 2019-02-06 MED FILL — METOPROLOL TARTRATE 25 MG T: 25 | 90 days supply | Qty: 90 | Fill #0

## 2019-02-06 MED FILL — ATORVASTATIN CALCIUM 80 MG: 80 | 90 days supply | Qty: 90 | Fill #0

## 2019-02-06 MED FILL — BRILINTA 90 MG TABLET: 90 | 30 days supply | Qty: 60 | Fill #0

## 2019-02-06 MED FILL — NITROGLYCERIN 0.4 MG TAB SL: 0.4 | 8 days supply | Qty: 25 | Fill #0

## 2019-02-06 NOTE — Telephone Encounter (Signed)
Patient's son, Pilar Plate, returning Dr. Antionette Char call.

## 2019-02-06 NOTE — Telephone Encounter (Signed)
**Note De-identified Adam [name] Obfuscation** The pt is being discharged from the hospital today. We will call him tomorrow 

## 2019-02-06 NOTE — Discharge Summary (Addendum)
Discharge Summary    Patient ID: Adam [name],  MRN: [id number], DOB/AGE: 03-Oct-1955 63 y.o.  Admit date: 02/04/2019 Discharge date: 02/06/2019  Primary Care Provider: Patient, No Pcp Per Primary Cardiologist: Sherren Mocha, MD  Discharge Diagnoses    Principal Problem:   NSTEMI (non-ST elevated myocardial infarction) [facility]) Active Problems:   Type 2 diabetes mellitus ([place])   Hyperlipidemia   Allergies No Known Allergies  Diagnostic Studies/Procedures    Cath: 02/05/19   CULPRIT LESION: Prox Cx to Mid Cx lesion is 95% stenosed. Dist Cx lesion is 60% stenosed.  A drug-eluting stent was successfully placed covering both lesions, using a STENT RESOLUTE ONYX 3.0X22.-Postdilated to 3.3 mm  Post intervention, there is a 0% residual stenosis.  --  LESION SEGMENT #2: LPAV lesion is 100% stenosed - heavy concentrated thrombus  1 passive aspiration thrombectomy followed by Balloon angioplasty was performed using a BALLOON SAPPHIRE 2.5X15.  Post intervention, there is a 60% residual stenosis -still significant thrombus burden  LPDA lesion is 95% stenosed -unable to tell the vessel size, very difficult to wire. Plan is to treat with Aggrastat to clear up thrombus burden and improve flow.  ---------  Colon Flattery LAD to Prox LAD lesion is 60% stenosed with 1st Diag lesion is 50% stenosed. - bifurcation lesion .  Prox RCA (very small caliber, non-dominant) lesion is 95% stenosed.  ---  The left ventricular systolic function is normal. The left ventricular ejection fraction is 50-55% by visual estimate.  Dist Cx lesion is 60% stenosed.   SUMMARY  Severe 3 vessel disease with culprit lesion being 95% heavily thrombotic ulcerated stenosis in the proximal Circumflex with thrombotic occlusion of the proximal Left PDA, there is 60% bifurcation LAD-1stDiag stenosis (plan medical management), and 95% proximal small nondominant RCA (medical management) ? Successful DES PCI of pLCx  using resolute Onyx DES 3.0 mm x 22 mm (3.3 mm) ? Aspiration Thrombectomy and PTCA only of ostial beat PDA -> suboptimal result due to significant thrombus burden  Preserved EF with inferior apical hypokinesis.  Mildly elevated LVEDP   RECOMMENDATIONS  Return to nursing unit for TR band removal.  We will run Aggrastat for 12 hours to improve distal flow  Plan for now is to treat based on symptomatology.  Would consider LAD-1STDiag bifurcation intervention if symptoms warrant in the future.  Aggressive risk factor modification --statin, (nonselective beta-blocker given recent tox screen)  Counseling to avoid substance abuse  Glenetta Hew, M.D., M.S. Interventional Cardiologist   Diagnostic Dominance: Left  Intervention    _____________   History of Present Illness     Adam [name] is a 63 yo male with no reported PMH but does not see a PCP. 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 Chest pain did not change with exertion but did have worsening dyspnea. No pleuritic or positional. Denied n/v, cough, diarrhea, fever, chills.  He did admit to cocaine use occasionally, about 20 times over the past year, most recently 1 week ago. He had never had chest pain before this episode and was adamant that he last used cocaine 1 week ago.   On arrival to the ED he had ongoing chest pain. ECG showed non-specific ST-T changes, not particularly ischemic. Troponin however is > 27,000 x 2. Repeat ECG showed developing TWI in anterolateral leads. Started on nitro ggt and was chest pain free at  the time of assessment. He has received full dose aspirin and heparin.   Hospital Course     He was placed on IV heparin and taken to the cath lab. Underwent cardiac cath noted above with severe 3v disease. Culprit lesion being a 95% thrombotic ulcerated stenosis in the  pLCx treated with PCI/DESx1. Additional underwent aspiration thrombectomy and PTCA of the ostial PDA, but only received sub optimal result 2/2 to thrombus burden. Also with 95% lesion in pRCA but very small caliber and non-dominant vessel, therefore was not intervened on. EF noted at 50-55% via LV gram with inferior apical hypokinesis. Placed on DAPT with ASA/Brilinta. Received aggrastat for 12 hours post cath. Placed on high dose statin. Lipid panel and Hgb A1c were ordered but unfortunately not drawn. CBGs were elevated in the 200s during admission. Seen by the diabetes educator. Placed on low dose metoprolol with blood pressures tolerating. Follow up echo pending at the time of discharge. He was counseled in the need for cessation of cocaine use with the start of his BB therapy. Did have HgbA1c drawn prior to discharge to be followed up on at outpatient appt. Seen by CM to help establish with PCP prior to discharge.   General: Well developed, well nourished, male appearing in no acute distress. Head: Normocephalic, atraumatic.  Neck: Supple without bruits, JVD. Lungs:  Resp regular and unlabored, CTA. Heart: RRR, S1, S2, no S3, S4, or murmur; no rub. Abdomen: Soft, non-tender, non-distended with normoactive bowel sounds. No hepatomegaly. No rebound/guarding. No obvious abdominal masses. Extremities: No clubbing, cyanosis, edema. Distal pedal pulses are 2+ bilaterally. Right radial cath site stable without bruising or hematoma Neuro: Alert and oriented X 3. Moves all extremities spontaneously. Psych: Normal affect.  Adam [name] was seen by Dr. Excell Seltzerooper and determined stable for discharge home. Follow up in the office has been arranged. Medications are listed below.   _____________  Discharge Vitals Blood pressure 102/70, pulse (!) 59, temperature 98.6 F (37 C), temperature source Oral, resp. rate 18, height 6\' 3"  (1.905 m), weight 93.2 kg, SpO2 99 %.  Filed Weights   02/04/19 2212 02/06/19  0558  Weight: 80.7 kg 93.2 kg    Labs & Radiologic Studies    CBC Recent Labs    02/05/19 0500 02/06/19 0350  WBC 7.9 9.6  HGB 12.0* 12.2*  HCT 38.7* 38.5*  MCV 90.2 88.5  PLT 191 186   Basic Metabolic Panel Recent Labs    45/40/9810/01/13 1905 02/06/19 0350  NA 135 139  K 3.8 3.8  CL 101 107  CO2 23 22  GLUCOSE 289* 189*  BUN 12 10  CREATININE 1.15 1.15  CALCIUM 9.6 9.2   Liver Function Tests No results for input(s): AST, ALT, ALKPHOS, BILITOT, PROT, ALBUMIN in the last 72 hours. No results for input(s): LIPASE, AMYLASE in the last 72 hours. Cardiac Enzymes No results for input(s): CKTOTAL, CKMB, CKMBINDEX, TROPONINI in the last 72 hours. BNP Invalid input(s): POCBNP D-Dimer No results for input(s): DDIMER in the last 72 hours. Hemoglobin A1C No results for input(s): HGBA1C in the last 72 hours. Fasting Lipid Panel No results for input(s): CHOL, HDL, LDLCALC, TRIG, CHOLHDL, LDLDIRECT in the last 72 hours. Thyroid Function Tests No results for input(s): TSH, T4TOTAL, T3FREE, THYROIDAB in the last 72 hours.  Invalid input(s): FREET3 _____________  Dg Chest 2 View  Result Date: 02/04/2019 CLINICAL DATA:  Chest pain EXAM: CHEST - 2 VIEW COMPARISON:  None. FINDINGS: The heart size and mediastinal  contours are within normal limits. Both lungs are clear. The visualized skeletal structures are unremarkable. IMPRESSION: No active cardiopulmonary disease. Electronically Signed   By: Jonna Clark M.D.   On: 02/04/2019 19:52   Disposition   Pt is being discharged home today in good condition.  Follow-up Plans & Appointments    Follow-up Information    [facility], Sharrell Ku, [place] Follow up on 02/18/2019.   Specialty: Cardiology Why: at 9am for your follow up appt.  Contact information: [address] [place] [address] [phone]          Discharge Instructions    Amb Referral to Cardiac Rehabilitation   Complete by: As directed    Diagnosis:   Coronary Stents NSTEMI     After initial evaluation and assessments completed: Virtual Based Care may be provided alone or in conjunction with Phase 2 Cardiac Rehab based on patient barriers.: Yes   Call MD for:  redness, tenderness, or signs of infection (pain, swelling, redness, odor or green/yellow discharge around incision site)   Complete by: As directed    Diet - low sodium heart healthy   Complete by: As directed    Discharge instructions   Complete by: As directed    Radial Site Care Refer to this sheet in the next few weeks. These instructions provide you with information on caring for yourself after your procedure. Your caregiver may also give you more specific instructions. Your treatment has been planned according to current medical practices, but problems sometimes occur. Call your caregiver if you have any problems or questions after your procedure. HOME CARE INSTRUCTIONS You may shower the day after the procedure.Remove the bandage (dressing) and gently wash the site with plain soap and water.Gently pat the site dry.  Do not apply powder or lotion to the site.  Do not submerge the affected site in water for 3 to 5 days.  Inspect the site at least twice daily.  Do not flex or bend the affected arm for 24 hours.  No lifting over 5 pounds (2.3 kg) for 5 days after your procedure.  Do not drive home if you are discharged the same day of the procedure. Have someone else drive you.  What to expect: Any bruising will usually fade within 1 to 2 weeks.  Blood that collects in the tissue (hematoma) may be painful to the touch. It should usually decrease in size and tenderness within 1 to 2 weeks.  SEEK IMMEDIATE MEDICAL CARE IF: You have unusual pain at the radial site.  You have redness, warmth, swelling, or pain at the radial site.  You have drainage (other than a small amount of blood on the dressing).  You have chills.  You have a fever or persistent symptoms for more than 72  hours.  You have a fever and your symptoms suddenly get worse.  Your arm becomes pale, cool, tingly, or numb.  You have heavy bleeding from the site. Hold pressure on the site.   PLEASE DO NOT MISS ANY DOSES OF YOUR BRILINTA!!!!! Also keep a log of you blood pressures and bring back to your follow up appt. Please call the office with any questions.   Patients taking blood thinners should generally stay away from medicines like ibuprofen, Advil, Motrin, naproxen, and Aleve due to risk of stomach bleeding. You may take Tylenol as directed or talk to your primary doctor about alternatives.   No lifting over 10 lbs for 4 weeks. No sexual activity for  4 weeks. You may not return to work until cleared by your cardiologist. Keep procedure site clean & dry. If you notice increased pain, swelling, bleeding or pus, call/return!  You may shower, but no soaking baths/hot tubs/pools for 1 week.   Increase activity slowly   Complete by: As directed      Discharge Medications     Medication List    TAKE these medications   aspirin 81 MG EC tablet Take 1 tablet (81 mg total) by mouth daily.   atorvastatin 80 MG tablet Commonly known as: LIPITOR Take 1 tablet (80 mg total) by mouth daily at 6 PM.   metoprolol tartrate 25 MG tablet Commonly known as: LOPRESSOR Take 0.5 tablets (12.5 mg total) by mouth 2 (two) times daily.   nitroGLYCERIN 0.4 MG SL tablet Commonly known as: Nitrostat Place 1 tablet (0.4 mg total) under the tongue every 5 (five) minutes as needed for chest pain.   ticagrelor 90 MG Tabs tablet Commonly known as: BRILINTA Take 1 tablet (90 mg total) by mouth 2 (two) times daily.        Yes                               AHA/ACC Clinical Performance & Quality Measures: 1. Aspirin prescribed? - Yes 2. ADP Receptor Inhibitor (Plavix/Clopidogrel, Brilinta/Ticagrelor or Effient/Prasugrel) prescribed (includes medically managed patients)? - Yes 3. Beta Blocker prescribed? -  Yes 4. High Intensity Statin (Lipitor 40-80mg  or Crestor 20-40mg ) prescribed? - Yes 5. EF assessed during THIS hospitalization? - Yes 6. For EF <40%, was ACEI/ARB prescribed? - Not Applicable (EF >/= 40%) 7. For EF <40%, Aldosterone Antagonist (Spironolactone or Eplerenone) prescribed? - Not Applicable (EF >/= 40%) 8. Cardiac Rehab Phase II ordered (Included Medically managed Patients)? - Yes   Outstanding Labs/Studies   Needs lipids at outpatient follow up.   Duration of Discharge Encounter   Greater than 30 minutes including physician time.  Signed, Laverda Page NP-C 02/06/2019, 10:59 AM   Patient seen, examined. Available data reviewed. Agree with findings, assessment, and plan as outlined by Laverda Page, NP.  On my exam today the patient is alert, oriented, in no distress.  Lungs are clear, heart is regular rate and rhythm without murmur gallop, abdomen soft and nontender, right radial site is clear with no hematoma.  Extremities have no edema.  Cardiac catheterization studies reviewed from yesterday.  The patient underwent successful PCI of the left circumflex.  He has no residual chest pain.  Post MI LVEF is 50 to 55%.  Current medicines are reviewed and appropriate with dual antiplatelet therapy and a high intensity statin drug.  He is also treated with a beta-blocker.  We will try to get him on an ACE inhibitor or ARB as an outpatient.  He has a new diagnosis of diabetes and will initially work on dietary changes.  He will need to establish with his primary physician to consider whether oral hypoglycemic therapy is necessary.  We discussed his return to work and post MI restrictions today.  I discussed his case with his son over the telephone.  He is medically stable for discharge today.  Tonny Bollman, M.D. 02/06/2019 2:29 PM

## 2019-02-06 NOTE — Progress Notes (Signed)
  Echocardiogram 2D Echocardiogram has been performed.  Adam [name] 02/06/2019, 9:10 AM

## 2019-02-06 NOTE — Progress Notes (Signed)
CARDIAC REHAB PHASE I   PRE:  Rate/Rhythm: 60 SR  BP:  Supine:   Sitting: 119/74  Standing:    SaO2: 98%RA  MODE:  Ambulation: 750 ft   POST:  Rate/Rhythm: 86 SR  BP:  Supine:   Sitting: 125/74  Standing:    SaO2: 96%RA 0907-1000 Pt walked 750 ft on RA with steady gait and tolerated well. MI education completed with pt who voiced understanding. Stressed importance of brilinta with stent. Reviewed NTG use, MI restrictions, heart healthy food choices and watching carbs, ex ed and CRP 2. No smart phone for virtual APP. Referred to [place] program.   Graylon Good, RN BSN  02/06/2019 9:50 AM

## 2019-02-06 NOTE — Telephone Encounter (Signed)
New Message    Pt has TOC appt with Vin Bhagat 11/24 @ 9am

## 2019-02-07 ENCOUNTER — Encounter (HOSPITAL_COMMUNITY): Payer: Self-pay

## 2019-02-07 ENCOUNTER — Ambulatory Visit (HOSPITAL_COMMUNITY)
Admission: EM | Admit: 2019-02-07 | Discharge: 2019-02-07 | Disposition: A | Discharge status: Home / Self Care | Payer: Self-pay | Attending: Family Medicine | Admitting: Family Medicine

## 2019-02-07 ENCOUNTER — Other Ambulatory Visit: Payer: Self-pay

## 2019-02-07 DIAGNOSIS — K229 Disease of esophagus, unspecified: Secondary | ICD-10-CM

## 2019-02-07 DIAGNOSIS — K2289 Other specified disease of esophagus: Secondary | ICD-10-CM

## 2019-02-07 NOTE — ED Triage Notes (Signed)
Pt presents with chest discomfort since yesterday.

## 2019-02-07 NOTE — Discharge Instructions (Addendum)
I believe that your discomfort was caused by taking your medication and not drinking enough water to wash the medication down and then lying flat afterwards. Make sure when you take your pills that you drink with a full glass of water and sit  up for at least 30 to 45 minutes to aid with digestion You may have a small amount of reflux also you can try some Maalox or Mylanta over-the-counter if needed If your chest pain returns or worsens you need to go to the ER otherwise follow-up with your cardiologist as planned.

## 2019-02-07 NOTE — Telephone Encounter (Signed)
**Note De-Identified Brielyn Bosak Obfuscation** Per the pts chart he is currently at the ER @ [facility] with c/o chest pain. We will continue to monitor his chart and will call once he is home.

## 2019-02-10 NOTE — Telephone Encounter (Signed)
Patient is coming to the 11/24 appt.

## 2019-02-10 NOTE — ED Provider Notes (Signed)
[facility]    CSN: [id number] Arrival date & time: 02/07/19  1016      History   Chief Complaint Chief Complaint  Patient presents with  . Chest Discomfort    HPI Adam [name] is a 63 y.o. male.   Patient is a 63 year old male with past medical history of CAD, diabetes, MI, NSTEMI.  He presents today with chest discomfort.  This started yesterday after taking his medication and sipping water.  He then proceeded to lay flat and felt some discomfort in his esophageal chest area.  Describes a sensation as feeling like something is stuck and mild burning.  As the day progressed his symptoms improved and completely went away.  He never had any recurrent chest pain, shortness of breath, dizziness, diaphoresis.  He is currently asymptomatic.  He did not take his morning medication because he was concerned.  ROS per HPI      Past Medical History:  Diagnosis Date  . Coronary artery disease   . Diabetes mellitus without complication (HCC)   . Myocardial infarction (HCC)    PCI/DES to pLCx, PTCA to PDA, small nondominent RCA with 95%  . Occasional recreational drug use     Patient Active Problem List   Diagnosis Date Noted  . Hyperlipidemia 02/06/2019  . Type 2 diabetes mellitus (HCC) 02/04/2019  . NSTEMI (non-ST elevated myocardial infarction) (HCC) 02/04/2019    Past Surgical History:  Procedure Laterality Date  . CORONARY BALLOON ANGIOPLASTY N/A 02/05/2019   Procedure: CORONARY BALLOON ANGIOPLASTY;  Surgeon: Marykay Lex, MD;  Location: [facility] INVASIVE CV LAB;  Service: Cardiovascular;  Laterality: N/A;  . CORONARY STENT INTERVENTION N/A 02/05/2019   Procedure: CORONARY STENT INTERVENTION;  Surgeon: Marykay Lex, MD;  Location: [facility] INVASIVE CV LAB;  Service: Cardiovascular;  Laterality: N/A;  . CORONARY THROMBECTOMY N/A 02/05/2019   Procedure: Coronary Thrombectomy;  Surgeon: Marykay Lex, MD;  Location: [facility] INVASIVE CV LAB;  Service: Cardiovascular;   Laterality: N/A;  . LEFT HEART CATH AND CORONARY ANGIOGRAPHY N/A 02/05/2019   Procedure: LEFT HEART CATH AND CORONARY ANGIOGRAPHY;  Surgeon: Marykay Lex, MD;  Location: [facility] INVASIVE CV LAB;  Service: Cardiovascular;  Laterality: N/A;       Home Medications    Prior to Admission medications   Medication Sig Start Date End Date Taking? Authorizing Provider  aspirin EC 81 MG EC tablet Take 1 tablet (81 mg total) by mouth daily. 02/06/19   Croitoru, Mihai, MD  atorvastatin (LIPITOR) 80 MG tablet Take 1 tablet (80 mg total) by mouth daily at 6 PM. 02/06/19   Croitoru, Mihai, MD  metoprolol tartrate (LOPRESSOR) 25 MG tablet Take 0.5 tablets (12.5 mg total) by mouth 2 (two) times daily. 02/06/19   Croitoru, Mihai, MD  nitroGLYCERIN (NITROSTAT) 0.4 MG SL tablet Place 1 tablet (0.4 mg total) under the tongue every 5 (five) minutes as needed for chest pain. 02/06/19 02/06/20  Croitoru, Mihai, MD  ticagrelor (BRILINTA) 90 MG TABS tablet Take 1 tablet (90 mg total) by mouth 2 (two) times daily. 02/06/19   Croitoru, Rachelle Hora, MD    Family History Family History  Problem Relation Age of Onset  . Healthy Neg Hx     Social History Social History   Tobacco Use  . Smoking status: Never Smoker  . Smokeless tobacco: Never Used  Substance Use Topics  . Alcohol use: Yes    Comment: SOCIAL  . Drug use: Yes    Types: Cocaine  Allergies   Patient has no known allergies.   Review of Systems Review of Systems   Physical Exam Triage Vital Signs ED Triage Vitals  Enc Vitals Group     BP 02/07/19 1028 99/68     Pulse Rate 02/07/19 1028 77     Resp 02/07/19 1028 16     Temp 02/07/19 1028 97.7 F (36.5 C)     Temp Source 02/07/19 1028 Oral     SpO2 02/07/19 1028 99 %     Weight --      Height --      Head Circumference --      Peak Flow --      Pain Score 02/07/19 1027 3     Pain Loc --      Pain Edu? --      Excl. in [place]? --    No data found.  Updated Vital Signs BP 99/68  (BP Location: Right Arm)   Pulse 77   Temp 97.7 F (36.5 C) (Oral)   Resp 16   SpO2 99%   Visual Acuity Right Eye Distance:   Left Eye Distance:   Bilateral Distance:    Right Eye Near:   Left Eye Near:    Bilateral Near:     Physical Exam Vitals signs and nursing note reviewed.  Constitutional:      General: He is not in acute distress.    Appearance: Normal appearance. He is not ill-appearing, toxic-appearing or diaphoretic.  HENT:     Head: Normocephalic and atraumatic.     Nose: Nose normal.  Eyes:     Conjunctiva/sclera: Conjunctivae normal.  Neck:     Musculoskeletal: Normal range of motion.  Cardiovascular:     Rate and Rhythm: Normal rate and regular rhythm.  Pulmonary:     Effort: Pulmonary effort is normal.     Breath sounds: Normal breath sounds.  Abdominal:     Palpations: Abdomen is soft.     Tenderness: There is no abdominal tenderness.  Musculoskeletal: Normal range of motion.  Skin:    General: Skin is warm and dry.  Neurological:     Mental Status: He is alert.  Psychiatric:        Mood and Affect: Mood normal.      UC Treatments / Results  Labs (all labs ordered are listed, but only abnormal results are displayed) Labs Reviewed - No data to display  EKG   Radiology No results found.  Procedures Procedures (including critical care time)  Medications Ordered in UC Medications - No data to display  Initial Impression / Assessment and Plan / UC Course  I have reviewed the triage vital signs and the nursing notes.  Pertinent labs & imaging results that were available during my care of the patient were reviewed by me and considered in my medical decision making (see chart for details).     63 year old male with recent MI and stent placement. He presents today with chest discomfort after taking his morning medication with a couple sips of water. Symptoms resolved and he is currently asymptomatic. EKG without changes from previous  EKG after post PCI His vital signs are stable and he is nontoxic or ill-appearing. Do not believe this is cardiac in nature.  Believe his discomfort was due to taking his medication without a significant amount of water and then laying flat. We will have him monitor his symptoms closely and if his chest pain returns he was instructed to go straight  to the ER.  Patient understands plan and agrees.   Final Clinical Impressions(s) / UC Diagnoses   Final diagnoses:  Irritable esophagus     Discharge Instructions     I believe that your discomfort was caused by taking your medication and not drinking enough water to wash the medication down and then lying flat afterwards. Make sure when you take your pills that you drink with a full glass of water and sit  up for at least 30 to 45 minutes to aid with digestion You may have a small amount of reflux also you can try some Maalox or Mylanta over-the-counter if needed If your chest pain returns or worsens you need to go to the ER otherwise follow-up with your cardiologist as planned.    ED Prescriptions    None     PDMP not reviewed this encounter.   Janace ArisBast, Zalen Sequeira A, NP 02/10/19 [phone]

## 2019-02-10 NOTE — Telephone Encounter (Signed)
**Note De-Identified Ell Tiso Obfuscation** The pt has 2 hosp f/u's scheduled. Which one is he comomg to or is he coming to baoth? Clarification please. Thanks.

## 2019-02-10 NOTE — Telephone Encounter (Signed)
Patient has OV 11/19 with Vin Bhagat.

## 2019-02-11 NOTE — Telephone Encounter (Signed)
**Note De-Identified Kiondre Grenz Obfuscation** TCM Call 1st attempt: No ans, left message on the pts VM asking him to call me back.

## 2019-02-12 ENCOUNTER — Telehealth (HOSPITAL_COMMUNITY): Payer: Self-pay

## 2019-02-12 NOTE — Telephone Encounter (Signed)
**Note De-Identified Emilie Carp Obfuscation** Patient contacted regarding discharge from [facility] on 02/06/2019.  Patient understands to follow up with provider Robbie Lis, PA-c on 02/18/2019 at 9 am at [facility] in [place], [place] [address]. Patient understands discharge instructions? Yes Patient understands medications and regiment? Yes  The pt c/o of insomnia since MI and is requesting a sleeping pill. I referred him to his PCP or urgent care for assistance with this.  Patient understands to bring all medications to this visit? Yes

## 2019-02-12 NOTE — Telephone Encounter (Signed)
Attempted to call patient in regards to Cardiac Rehab - LM on VM 

## 2019-02-13 ENCOUNTER — Ambulatory Visit: Payer: Self-pay | Admitting: Physician Assistant

## 2019-02-16 NOTE — Progress Notes (Signed)
Cardiology Office Note:    Date:  02/18/2019   ID:  Adam [name], DOB 1955/12/12, MRN [id number]  PCP:  Patient, No Pcp Per  Cardiologist:  Sherren Mocha, MD  Electrophysiologist:  None   Referring MD: No ref. provider found   Chief Complaint: hospital follow-up for NSTEMI  History of Present Illness:    Adam [name] is a 63 y.o. male with a history of CAD with recent NSTEMI s/p PCI/DES to proximal circumflex lesion and aspiration thrombectomy and PTCA of ostial PDA , hyperlipidemia, type 2 diabetes mellitus, and cocaine use who is a patient of Dr. Antionette Char and presents today for hospital follow-up.  Patient recently admitted from 02/04/2019 to 02/06/2019 for NSTEMI after presenting with sudden onset of chest pain with associated diaphoresis and exertional dyspnea. Prior to admission, patient had no known past medical history but did not regularly see a PCP. Patient endorsed occasional cocaine use on presentation but denied any use in the last week. Urine drug screen positive for cocaine on admission. High-sensitivity troponin peaked at >27,000. Patient underwent cardiac catheterization which showed severe 3 vessel disease with 95% stenosis of the proximal to mid circumflex lesion (culprit lesion), 60% stenosis of the distal circumflex lesion, 100% stenosis of LPAV with heavily concentrated thrombus, 60% stenosis of the ostial to proximal LAD, 50% stenosis of 1st Diag, and 95% stenosis of the proximal RCA (very small caliber, non-dominant). Patient was treated with successful PCI/DES of proximal circumflex lesion. Also treated with aspiration thrombectomy and PTCA of ostial PDA with only suboptimal result due to significant burden. Aggrastat was run for 12 hours following cath to improve distal flow. Echo showed LVEF of 50-55% with regional wall motion abnormalities affecting the basal and mid inferolateral wall and basal inferior segment as well as borderline LVH, grade 1 diastolic  dysfunction, pericardial fat pad, mild MR, mild TR, and mild dilatation of the aortic root measuring 41 mm. Hemoglobin A1c 8.4% consistent with diabetes. Unfortunately, lipid panel was not able to be drawn prior to discharge. Patient was discharged on Aspirin 97m daily, Brilinta 926mtwice daily, Lopressor 12.30m19mwice daily, and Lipitor 7m72mily.   Following discharge, patient presented back to the ED on 02/07/2019 for chest discomfort after taking medications with only small about of water. He described sensation as something stuck in his chest and mild burning. Discomfort completely resolved throughout the day without any recurrence. EKG showed no significant changes from prior tracings. Symptoms felt to be due to taking medications with not enough water and laying flat afterwards. Therefore, he was discharged with instruction to follow-up with Cardiology as planned and returned to ED if pain recurs.   Patient presents today for hospital follow-up. Here alone. However, did call patient's son at end of visit at patient's request to discuss plan with him. Patient doing well since discharge. He denies any recurrent chest pain. No shortness of breath, palpitations, lightheadedness, dizziness, near syncope/syncope, orthopnea, PND, lower extremity edema.  He has been walking 30 minutes every day with no symptoms.  He has been compliant with his medications and has been tolerating them well. No abnormal bleeding on dual antiplatelet therapy. He denies any cocaine use.   Past Medical History:  Diagnosis Date   Coronary artery disease    Diabetes mellitus without complication (HCC)[place] Myocardial infarction (HCC)[place] PCI/DES to pLCx, PTCA to PDA, small nondominent RCA with 95%   Occasional recreational drug use     Past Surgical History:  Procedure Laterality Date   CORONARY BALLOON ANGIOPLASTY N/A 02/05/2019   Procedure: CORONARY BALLOON ANGIOPLASTY;  Surgeon: Leonie Man, MD;  Location: [facility];  Service: Cardiovascular;  Laterality: N/A;   CORONARY STENT INTERVENTION N/A 02/05/2019   Procedure: CORONARY STENT INTERVENTION;  Surgeon: Leonie Man, MD;  Location: [facility];  Service: Cardiovascular;  Laterality: N/A;   CORONARY THROMBECTOMY N/A 02/05/2019   Procedure: Coronary Thrombectomy;  Surgeon: Leonie Man, MD;  Location: [facility];  Service: Cardiovascular;  Laterality: N/A;   LEFT HEART CATH AND CORONARY ANGIOGRAPHY N/A 02/05/2019   Procedure: LEFT HEART CATH AND CORONARY ANGIOGRAPHY;  Surgeon: Leonie Man, MD;  Location: [facility];  Service: Cardiovascular;  Laterality: N/A;    Current Medications: Current Meds  Medication Sig   aspirin EC 81 MG EC tablet Take 1 tablet (81 mg total) by mouth daily.   atorvastatin (LIPITOR) 80 MG tablet Take 1 tablet (80 mg total) by mouth daily at 6 PM.   metoprolol tartrate (LOPRESSOR) 25 MG tablet Take 0.5 tablets (12.5 mg total) by mouth 2 (two) times daily.   Multiple Vitamins-Minerals (MULTI FOR HIM PO) Take by mouth. gummies with omega 3   nitroGLYCERIN (NITROSTAT) 0.4 MG SL tablet Place 1 tablet (0.4 mg total) under the tongue every 5 (five) minutes as needed for chest pain.   ticagrelor (BRILINTA) 90 MG TABS tablet Take 1 tablet (90 mg total) by mouth 2 (two) times daily.     Allergies:   Patient has no known allergies.   Social History   Socioeconomic History   Marital status: Single    Spouse name: Not on file   Number of children: Not on file   Years of education: Not on file   Highest education level: Not on file  Occupational History   Not on file  Social Needs   Financial resource strain: Not on file   Food insecurity    Worry: Not on file    Inability: Not on file   Transportation needs    Medical: Not on file    Non-medical: Not on file  Tobacco Use   Smoking status: Never Smoker   Smokeless tobacco: Never Used  Substance and Sexual  Activity   Alcohol use: Yes    Comment: SOCIAL   Drug use: Yes    Types: Cocaine   Sexual activity: Not on file  Lifestyle   Physical activity    Days per week: Not on file    Minutes per session: Not on file   Stress: Not on file  Relationships   Social connections    Talks on phone: Not on file    Gets together: Not on file    Attends religious service: Not on file    Active member of club or organization: Not on file    Attends meetings of clubs or organizations: Not on file    Relationship status: Not on file  Other Topics Concern   Not on file  Social History Narrative   Not on file     Family History: The patient's family history is negative for Healthy.  ROS:   Please see the history of present illness.    All other systems reviewed and are negative.  EKGs/Labs/Other Studies Reviewed:    The following studies were reviewed today:  Left Heart Catheterization 02/05/2019:  CULPRIT LESION: Prox Cx to Mid Cx lesion is 95% stenosed. Dist Cx lesion is  60% stenosed.  A drug-eluting stent was successfully placed covering both lesions, using a STENT RESOLUTE ONYX 3.0X22.-Postdilated to 3.3 mm  Post intervention, there is a 0% residual stenosis.  --  LESION SEGMENT #2: LPAV lesion is 100% stenosed - heavy concentrated thrombus  1 passive aspiration thrombectomy followed by Balloon angioplasty was performed using a BALLOON SAPPHIRE 2.5X15.  Post intervention, there is a 60% residual stenosis -still significant thrombus burden  LPDA lesion is 95% stenosed -unable to tell the vessel size, very difficult to wire. Plan is to treat with Aggrastat to clear up thrombus burden and improve flow.  ---------  [place] LAD to Prox LAD lesion is 60% stenosed with 1st Diag lesion is 50% stenosed. - bifurcation lesion .  Prox RCA (very small caliber, non-dominant) lesion is 95% stenosed.  ---  The left ventricular systolic function is normal. The left ventricular ejection  fraction is 50-55% by visual estimate.  Dist Cx lesion is 60% stenosed.   Summary:  Severe 3 vessel disease with culprit lesion being 95% heavily thrombotic ulcerated stenosis in the proximal Circumflex with thrombotic occlusion of the proximal Left PDA, there is 60% bifurcation LAD-1stDiag stenosis (plan medical management), and 95% proximal small nondominant RCA (medical management) ? Successful DES PCI of pLCx using resolute Onyx DES 3.0 mm x 22 mm (3.3 mm) ? Aspiration Thrombectomy and PTCA only of ostial beat PDA -> suboptimal result due to significant thrombus burden  Preserved EF with inferior apical hypokinesis.  Mildly elevated LVEDP  Recommendations:  Return to nursing unit for TR band removal.  We will run Aggrastat for 12 hours to improve distal flow  Plan for now is to treat based on symptomatology.  Would consider LAD-1STDiag bifurcation intervention if symptoms warrant in the future.  Aggressive risk factor modification --statin, (nonselective beta-blocker given recent tox screen)  Counseling to avoid substance abuse _______________  Echocardiogram 02/06/2019: Impressions: 1. Left ventricular ejection fraction, by visual estimation, is 50 to 55%. The left ventricle has low normal function. There is borderline left ventricular hypertrophy.  2. Basal and mid inferolateral wall and basal inferior segment are abnormal.  3. Left ventricular diastolic parameters are consistent with Grade I diastolic dysfunction (impaired relaxation).  4. The left ventricle demonstrates regional wall motion abnormalities.  5. Global right ventricle has normal systolic function.The right ventricular size is normal. No increase in right ventricular wall thickness.  6. Left atrial size was normal.  7. Right atrial size was normal.  8. Presence of pericardial fat pad.  9. The pericardial effusion is circumferential. 10. Mild mitral annular calcification. 11. The mitral valve is grossly  normal. Mild mitral valve regurgitation. 12. The tricuspid valve is grossly normal. Tricuspid valve regurgitation is mild. 13. The aortic valve is tricuspid. Aortic valve regurgitation is not visualized. No evidence of aortic valve sclerosis or stenosis. 14. The pulmonic valve was grossly normal. Pulmonic valve regurgitation is not visualized. 15. Aortic dilatation noted. 16. There is mild dilatation of the aortic root measuring 41 mm. 17. The ascending aorta is mildly dilated ~40 mm. 18. Normal pulmonary artery systolic pressure. 19. The tricuspid regurgitant velocity is 2.22 m/s, and with an assumed right atrial pressure of 3 mmHg, the estimated right ventricular systolic pressure is normal at 22.6 mmHg. 20. The inferior vena cava is normal in size with greater than 50% respiratory variability, suggesting right atrial pressure of 3 mmHg.  EKG:  EKG not ordered today.   Recent Labs: 02/06/2019: BUN 10; Creatinine, Ser 1.15; Hemoglobin  12.2; Platelets 186; Potassium 3.8; Sodium 139  Recent Lipid Panel No results found for: CHOL, TRIG, HDL, CHOLHDL, VLDL, LDLCALC, LDLDIRECT  Physical Exam:    Vital Signs: BP 114/70    Pulse 75    Ht '6\' 3"'  (1.905 m)    Wt 201 lb 6.4 oz (91.4 kg)    SpO2 100%    BMI 25.17 kg/m     Wt Readings from Last 3 Encounters:  02/18/19 201 lb 6.4 oz (91.4 kg)  02/06/19 205 lb 6.4 oz (93.2 kg)     General: 63 y.o. African-American male in no acute distress. HEENT: Normocephalic and atraumatic. Sclera clear. EOMs intact. Neck: Supple. No carotid bruits. No JVD. Heart: RRR. Distinct S1 and S2. No murmurs, gallops, or rubs. Radial pulses 2+ and equal bilaterally. Right radial cath site soft with no signs of hematoma. Lungs: No increased work of breathing. Clear to ausculation bilaterally. No wheezes, rhonchi, or rales.  Abdomen: Soft, non-distended, and non-tender to palpation. Bowel sounds present. MSK: Normal strength and tone for age. Extremities: No clubbing,  cyanosis, or edema.    Skin: Warm and dry. Neuro: Alert and oriented x3. No focal deficits. Psych: Normal affect. Responds appropriately.   Assessment:    1. Coronary artery disease involving native coronary artery of native heart without angina pectoris   2. History of non-ST elevation myocardial infarction (NSTEMI)   3. Type 2 diabetes mellitus with complication, without long-term current use of insulin ([place])   4. Anemia, unspecified type   5. Cocaine abuse ([place])     Plan:    CAD with Recent NSTEMI - Recent NSTEMI and found to have severe 3 vessel disease on cardiac cath. Treated with PCI/DES to proximal circumflex lesion and aspiration thrombectomy and PTCA of ostial PDA with only suboptimal result due to significant burden. - Doing well. No recurrent angina. Walking 30 minutes daily without any problems. - Continue current medical therapy: Aspirin 152m daily, Brilinta 968mtwice daily, Lopressor 12.52m64mwice daily, and Lipitor 40m12mily. Reemphasized the importance of medication compliance especially with dual antiplatelet therapy. - Patient is not fasting today. Therefore, will have patient return in next 1-2 weeks for lipid panel, CMET, and CBC.  - Patient may return to work next week. 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 Work note given. - Recommended Cardiac Rehab. Patient states he thinks he has first appointment with them next month.  Newly Diagnosed Type 2 Diabetes Mellitus - Hemoglobin A1c 8.4% during recent admission.  - Recommended starting with lifestyle medications but suspect patient will likely need medications. Will defer this to PCP. - Patient does not currently have a PCP. Will give patient TriaDanielsber to call so he can establish care with PCP.  Anemia - Hemoglobin 12.2 following cardiac cath, down from 13.5 on admission.  - Patient denies any abnormal  bleeding on dual antiplatelet therapy. - Will recheck CBC when patient comes for fasting labs in the next couple of weeks.    Cocaine Abuse - Urine drug during recent admission positive for cocaine. - He denies any cocaine use since discharge. Re-emphasized the importance of not using cocaine while on beta-blocker. Patient voice understanding and agreed.  Disposition: Follow up in 3 months with Dr. CoopBurt KnackMedication Adjustments/Labs and Tests Ordered: Current medicines are reviewed at length with the patient today.  Concerns regarding medicines are outlined above.  Orders Placed This Encounter  Procedures   Comp Met (CMET)   CBC with Differential   Lipid Profile   No orders of the defined types were placed in this encounter.   Patient Instructions  Medication Instructions:   *If you need a refill on your cardiac medications before your next appointment, please call your pharmacy*  Lab Work: Your physician recommends that you return for lab work in:2 weeks for fasting lipid panel, CMET and CBC.  If you have labs (blood work) drawn today and your tests are completely normal, you will receive your results only by:  [place] (if you have MyChart) OR  A paper copy in the mail If you have any lab test that is abnormal or we need to change your treatment, we will call you to review the results.  Testing/Procedures: None ordered today.  Follow-Up: At [facility], you and your health needs are our priority.  As part of our continuing mission to provide you with exceptional heart care, we have created designated Provider Care Teams.  These Care Teams include your primary Cardiologist (physician) and Advanced Practice Providers (APPs -  Physician Assistants and Nurse Practitioners) who all work together to provide you with the care you need, when you need it.  Your next appointment:   3 month(s)  The format for your next appointment:   In Person  Provider:   You  may see Sherren Mocha, MD or one of the following Advanced Practice Providers on your designated Care Team:    Richardson Dopp, PA-C  [place], [place]  Daune Perch, NP   Other Instructions Do not lift more than 15 pounds in the next 2 weeks.    Signed, Darreld Mclean, PA-C  02/18/2019 10:08 AM    [place]

## 2019-02-17 ENCOUNTER — Telehealth: Payer: Self-pay | Admitting: Physician Assistant

## 2019-02-17 NOTE — Telephone Encounter (Signed)
New message   Patients son Pilar Plate requesting to accompany patient during 11/24 appointment to help patient understand care plan

## 2019-02-17 NOTE — Telephone Encounter (Signed)
Called patient back to let him know, no visitors policy. Patient verbalized understanding.

## 2019-02-18 ENCOUNTER — Ambulatory Visit (INDEPENDENT_AMBULATORY_CARE_PROVIDER_SITE_OTHER): Payer: Self-pay | Admitting: Student

## 2019-02-18 ENCOUNTER — Encounter: Payer: Self-pay | Admitting: Physician Assistant

## 2019-02-18 ENCOUNTER — Other Ambulatory Visit: Payer: Self-pay

## 2019-02-18 VITALS — BP 114/70 | HR 75 | Ht 75.0 in | Wt 201.4 lb

## 2019-02-18 DIAGNOSIS — E118 Type 2 diabetes mellitus with unspecified complications: Secondary | ICD-10-CM

## 2019-02-18 DIAGNOSIS — I252 Old myocardial infarction: Secondary | ICD-10-CM

## 2019-02-18 DIAGNOSIS — F1410 Cocaine abuse, uncomplicated: Secondary | ICD-10-CM

## 2019-02-18 DIAGNOSIS — D649 Anemia, unspecified: Secondary | ICD-10-CM

## 2019-02-18 DIAGNOSIS — I2510 Atherosclerotic heart disease of native coronary artery without angina pectoris: Secondary | ICD-10-CM

## 2019-02-18 NOTE — Patient Instructions (Signed)
Medication Instructions:   *If you need a refill on your cardiac medications before your next appointment, please call your pharmacy*  Lab Work: Your physician recommends that you return for lab work in:2 weeks for fasting lipid panel, CMET and CBC.  If you have labs (blood work) drawn today and your tests are completely normal, you will receive your results only by: [place] MyChart Message (if you have MyChart) OR . A paper copy in the mail If you have any lab test that is abnormal or we need to change your treatment, we will call you to review the results.  Testing/Procedures: None ordered today.  Follow-Up: At [facility], you and your health needs are our priority.  As part of our continuing mission to provide you with exceptional heart care, we have created designated Provider Care Teams.  These Care Teams include your primary Cardiologist (physician) and Advanced Practice Providers (APPs -  Physician Assistants and Nurse Practitioners) who all work together to provide you with the care you need, when you need it.  Your next appointment:   3 month(s)  The format for your next appointment:   In Person  Provider:   You may see Sherren Mocha, MD or one of the following Advanced Practice Providers on your designated Care Team:    Richardson Dopp, PA-C  [place], [place]  Daune Perch, NP   Other Instructions Do not lift more than 15 pounds in the next 2 weeks.

## 2019-02-19 ENCOUNTER — Encounter (HOSPITAL_COMMUNITY): Payer: Self-pay

## 2019-02-19 ENCOUNTER — Telehealth (HOSPITAL_COMMUNITY): Payer: Self-pay

## 2019-02-19 NOTE — Telephone Encounter (Signed)
Attempted to call patient in regards to Cardiac Rehab - LM on VM Mailed letter 

## 2019-03-04 ENCOUNTER — Other Ambulatory Visit: Payer: Self-pay

## 2019-03-04 ENCOUNTER — Telehealth: Payer: Self-pay | Admitting: Student

## 2019-03-04 DIAGNOSIS — I2510 Atherosclerotic heart disease of native coronary artery without angina pectoris: Secondary | ICD-10-CM

## 2019-03-04 LAB — COMPREHENSIVE METABOLIC PANEL
ALT: 47 IU/L — ABNORMAL HIGH (ref 0–44)
AST: 23 IU/L (ref 0–40)
Albumin/Globulin Ratio: 1.8 (ref 1.2–2.2)
Albumin: 4.3 g/dL (ref 3.8–4.8)
Alkaline Phosphatase: 124 IU/L — ABNORMAL HIGH (ref 39–117)
BUN/Creatinine Ratio: 13 (ref 10–24)
BUN: 15 mg/dL (ref 8–27)
Bilirubin Total: 1.7 mg/dL — ABNORMAL HIGH (ref 0.0–1.2)
CO2: 24 mmol/L (ref 20–29)
Calcium: 9.6 mg/dL (ref 8.6–10.2)
Chloride: 101 mmol/L (ref 96–106)
Creatinine, Ser: 1.14 mg/dL (ref 0.76–1.27)
GFR calc Af Amer: 78 mL/min/{1.73_m2} (ref >59)
GFR calc non Af Amer: 68 mL/min/{1.73_m2} (ref >59)
Globulin, Total: 2.4 g/dL (ref 1.5–4.5)
Glucose: 208 mg/dL — ABNORMAL HIGH (ref 65–99)
Potassium: 4.2 mmol/L (ref 3.5–5.2)
Sodium: 138 mmol/L (ref 134–144)
Total Protein: 6.7 g/dL (ref 6.0–8.5)

## 2019-03-04 LAB — LIPID PANEL
Chol/HDL Ratio: 2.6 ratio (ref 0.0–5.0)
Cholesterol, Total: 105 mg/dL (ref 100–199)
HDL: 41 mg/dL (ref >39)
LDL Chol Calc (NIH): 48 mg/dL (ref 0–99)
Triglycerides: 77 mg/dL (ref 0–149)
VLDL Cholesterol Cal: 16 mg/dL (ref 5–40)

## 2019-03-04 LAB — CBC WITH DIFFERENTIAL/PLATELET
Basophils Absolute: 0 10*3/uL (ref 0.0–0.2)
Basos: 0 %
EOS (ABSOLUTE): 0.2 10*3/uL (ref 0.0–0.4)
Eos: 3 %
Hematocrit: 40.1 % (ref 37.5–51.0)
Hemoglobin: 13.1 g/dL (ref 13.0–17.7)
Immature Grans (Abs): 0.1 10*3/uL (ref 0.0–0.1)
Immature Granulocytes: 1 %
Lymphocytes Absolute: 2.3 10*3/uL (ref 0.7–3.1)
Lymphs: 35 %
MCH: 27.8 pg (ref 26.6–33.0)
MCHC: 32.7 g/dL (ref 31.5–35.7)
MCV: 85 fL (ref 79–97)
Monocytes Absolute: 0.6 10*3/uL (ref 0.1–0.9)
Monocytes: 9 %
Neutrophils Absolute: 3.5 10*3/uL (ref 1.4–7.0)
Neutrophils: 52 %
Platelets: 203 10*3/uL (ref 150–450)
RBC: 4.72 x10E6/uL (ref 4.14–5.80)
RDW: 11.4 % — ABNORMAL LOW (ref 11.6–15.4)
WBC: 6.7 10*3/uL (ref 3.4–10.8)

## 2019-03-04 NOTE — Telephone Encounter (Signed)
Good morning Jeani Hawking, LPN, just getting in, i'm leaving 3 bottles of Aspirin and 3 bottles of Brilinta 90 mcg. Thanks Jeani Hawking, LPN

## 2019-03-04 NOTE — Telephone Encounter (Signed)
New message   Patient calling the office for samples of medication:    1.  What medication and dosage are you requesting samples for?ticagrelor (BRILINTA) 90 MG TABS tablet  2.  Are you currently out of this medication? YES

## 2019-03-04 NOTE — Telephone Encounter (Signed)
The pt is advised that we will take care of his [place] Pt Asst application and that we will leave him 3 boxes of Brilinta 90 mg samples and 2 bottles of ASA 81 mg samples in our downstairs lobby for him to pick up.  He verbalized understanding and thanked me for my help.

## 2019-03-04 NOTE — Telephone Encounter (Signed)
° °  Patient would like to discuss assistance getting ticagrelor (BRILINTA) 90 MG TABS tablet . Application dropped off at front desk on 12/7

## 2019-03-05 ENCOUNTER — Telehealth: Payer: Self-pay | Admitting: *Deleted

## 2019-03-05 ENCOUNTER — Telehealth (HOSPITAL_COMMUNITY): Payer: Self-pay

## 2019-03-05 DIAGNOSIS — I2510 Atherosclerotic heart disease of native coronary artery without angina pectoris: Secondary | ICD-10-CM

## 2019-03-05 NOTE — Telephone Encounter (Signed)
No response from pt regarding CR.  Closed referral.  

## 2019-03-05 NOTE — Telephone Encounter (Signed)
**Note De-Identified Adam [name] Obfuscation** Dr Johnsie Cancel, DOD, has signed the pts application and [place].

## 2019-03-05 NOTE — Telephone Encounter (Signed)
-----   Message from Darreld Mclean, [place] sent at 03/05/2019  3:43 PM EST ----- Please notify patient of results: Cholesterol looks great. Two of his liver enzymes are very slightly elevated. Would like to recheck LFTs in about 6 weeks. Blood sugar also elevated. Continue to monitor diet and limit carbs and sweets. Patient needs to make sure he gets established with PCP for further management of newly diagnosed diabetes.  Thank you!

## 2019-03-13 ENCOUNTER — Other Ambulatory Visit: Payer: Self-pay | Admitting: Cardiology

## 2019-03-13 DIAGNOSIS — Z20822 Contact with and (suspected) exposure to covid-19: Secondary | ICD-10-CM

## 2019-03-14 LAB — NOVEL CORONAVIRUS, NAA: SARS-CoV-2, NAA: NOT DETECTED

## 2019-03-26 ENCOUNTER — Ambulatory Visit: Payer: Self-pay

## 2019-03-27 ENCOUNTER — Other Ambulatory Visit: Payer: Self-pay

## 2019-03-27 DIAGNOSIS — Z20822 Contact with and (suspected) exposure to covid-19: Secondary | ICD-10-CM

## 2019-03-29 LAB — NOVEL CORONAVIRUS, NAA: SARS-CoV-2, NAA: NOT DETECTED

## 2019-04-14 ENCOUNTER — Telehealth: Payer: Self-pay

## 2019-04-14 NOTE — Telephone Encounter (Signed)
Called patient to do their pre-visit COVID screening.  Call went to voicemail. Unable to do prescreening.  

## 2019-04-15 ENCOUNTER — Other Ambulatory Visit: Payer: Self-pay

## 2019-04-15 ENCOUNTER — Ambulatory Visit (INDEPENDENT_AMBULATORY_CARE_PROVIDER_SITE_OTHER): Payer: Self-pay | Admitting: Internal Medicine

## 2019-04-15 ENCOUNTER — Telehealth: Payer: Self-pay | Admitting: Internal Medicine

## 2019-04-15 VITALS — BP 108/69 | HR 60 | Temp 97.5°F | Resp 17 | Ht 75.0 in | Wt 200.4 lb

## 2019-04-15 DIAGNOSIS — R7989 Other specified abnormal findings of blood chemistry: Secondary | ICD-10-CM

## 2019-04-15 DIAGNOSIS — I25810 Atherosclerosis of coronary artery bypass graft(s) without angina pectoris: Secondary | ICD-10-CM

## 2019-04-15 DIAGNOSIS — Z7689 Persons encountering health services in other specified circumstances: Secondary | ICD-10-CM

## 2019-04-15 DIAGNOSIS — E1159 Type 2 diabetes mellitus with other circulatory complications: Secondary | ICD-10-CM

## 2019-04-15 DIAGNOSIS — F1411 Cocaine abuse, in remission: Secondary | ICD-10-CM | POA: Insufficient documentation

## 2019-04-15 DIAGNOSIS — R945 Abnormal results of liver function studies: Secondary | ICD-10-CM

## 2019-04-15 LAB — GLUCOSE, POCT (MANUAL RESULT ENTRY): POC Glucose: 225 mg/dl — AB (ref 70–99)

## 2019-04-15 MED ORDER — ACCU-CHEK GUIDE ME W/DEVICE KIT: 1.0000 | PACK | Freq: Every day | 0 refills | Status: DC

## 2019-04-15 MED ORDER — ACCU-CHEK FASTCLIX LANCET KIT: PACK | 0 refills | Status: DC

## 2019-04-15 MED ORDER — METFORMIN HCL 500 MG PO TABS: 500.0000 mg | ORAL_TABLET | Freq: Two times a day (BID) | ORAL | 3 refills | Status: DC

## 2019-04-15 MED ORDER — ACCU-CHEK GUIDE VI STRP: ORAL_STRIP | 1 refills | Status: DC

## 2019-04-15 MED ORDER — ACCU-CHEK FASTCLIX LANCETS MISC: 1 refills | Status: DC

## 2019-04-15 NOTE — Progress Notes (Signed)
Patient ID: Adam [name], male    DOB: 1955/05/26  MRN: [id number]  CC: Establish Care (patient is fasting) and Diabetes (newly diagnosed. doesn't have a meter to check blood sugars. has c/o polyuria. denies nausea, vomiting, numbness/tingling in feet, polydipsia. )   Subjective: Adam [name] is a 64 y.o. male who presents for new pt visit at [facility] His concerns today include:  Pt with hx of DM,HL, CAD (NSTEMI, 2 stents), cocaine abuse  No previous PCP.  Pt with newly dx DM while in hosp 01/2019 with acute NSTEMI.  A1C was 8.4.  Patient was not discharged on any medication for the diabetes.  He does not have any device to check his blood sugars.  He endorses some polyuria.  Denies numbness or tingling in the hands or feet, polydipsia. Problems reading small print.  Last eye exam was many years ago.  CAD: Saw the cardiology PA in f/u the end of November.   He was released to return to work.  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  He denies any chest pains, shortness of breath, PND orthopnea over the past several days.  Reports compliance with taking his medications.  He has not had any bruising or bleeding on aspirin and Brilinta. Quit cocaine use post MI  Past medical, social, family history and surgical histories reviewed and updated. Patient Active Problem List   Diagnosis Date Noted  . Hyperlipidemia 02/06/2019  . Type 2 diabetes mellitus ([place]) 02/04/2019  . NSTEMI (non-ST elevated myocardial infarction) ([place]) 02/04/2019     Current Outpatient Medications on File Prior to Visit  Medication Sig Dispense Refill  . aspirin EC 81 MG EC tablet Take 1 tablet (81 mg total) by mouth daily. 90 tablet 3  . atorvastatin (LIPITOR) 80 MG tablet Take 1 tablet (80 mg total) by mouth daily at 6 PM. 90 tablet 3  . metoprolol tartrate (LOPRESSOR) 25 MG tablet Take 0.5 tablets (12.5 mg total) by mouth 2 (two) times daily. 90 tablet 2  . Multiple Vitamins-Minerals (MULTI FOR HIM PO) Take by  mouth. gummies with omega 3    . nitroGLYCERIN (NITROSTAT) 0.4 MG SL tablet Place 1 tablet (0.4 mg total) under the tongue every 5 (five) minutes as needed for chest pain. 25 tablet 2  . ticagrelor (BRILINTA) 90 MG TABS tablet Take 1 tablet (90 mg total) by mouth 2 (two) times daily. 180 tablet 2   No current facility-administered medications on file prior to visit.    No Known Allergies  Social History   Socioeconomic History  . Marital status: Single    Spouse name: Not on file  . Number of children: 12  . Years of education: 24  . Highest education level: Not on file  Occupational History  . Not on file  Tobacco Use  . Smoking status: Never Smoker  . Smokeless tobacco: Never Used  Substance and Sexual Activity  . Alcohol use: Yes    Comment: SOCIAL  . Drug use: Not Currently    Types: Cocaine  . Sexual activity: Not on file  Other Topics Concern  . Not on file  Social History Narrative  . Not on file   Social Determinants of Health   Financial Resource Strain:   . Difficulty of Paying Living Expenses: Not on file  Food Insecurity:   . Worried About Charity fundraiser in the Last Year: Not on file  . Ran Out of Food in the Last Year: Not on  file  Transportation Needs:   . Film/video editor (Medical): Not on file  . Lack of Transportation (Non-Medical): Not on file  Physical Activity:   . Days of Exercise per Week: Not on file  . Minutes of Exercise per Session: Not on file  Stress:   . Feeling of Stress : Not on file  Social Connections:   . Frequency of Communication with Friends and Family: Not on file  . Frequency of Social Gatherings with Friends and Family: Not on file  . Attends Religious Services: Not on file  . Active Member of Clubs or Organizations: Not on file  . Attends Archivist Meetings: Not on file  . Marital Status: Not on file  Intimate Partner Violence:   . Fear of Current or Ex-Partner: Not on file  . Emotionally Abused:  Not on file  . Physically Abused: Not on file  . Sexually Abused: Not on file    Family History  Problem Relation Age of Onset  . Heart disease Mother   . Diabetes Father   . Breast cancer Sister   . Multiple sclerosis Sister   . Stomach cancer Sister   . Healthy Neg Hx     Past Surgical History:  Procedure Laterality Date  . CORONARY BALLOON ANGIOPLASTY N/A 02/05/2019   Procedure: CORONARY BALLOON ANGIOPLASTY;  Surgeon: Leonie Man, MD;  Location: [facility];  Service: Cardiovascular;  Laterality: N/A;  . CORONARY STENT INTERVENTION N/A 02/05/2019   Procedure: CORONARY STENT INTERVENTION;  Surgeon: Leonie Man, MD;  Location: [facility];  Service: Cardiovascular;  Laterality: N/A;  . CORONARY THROMBECTOMY N/A 02/05/2019   Procedure: Coronary Thrombectomy;  Surgeon: Leonie Man, MD;  Location: [facility];  Service: Cardiovascular;  Laterality: N/A;  . LEFT HEART CATH AND CORONARY ANGIOGRAPHY N/A 02/05/2019   Procedure: LEFT HEART CATH AND CORONARY ANGIOGRAPHY;  Surgeon: Leonie Man, MD;  Location: [facility];  Service: Cardiovascular;  Laterality: N/A;    ROS: Review of Systems Negative except as stated above  PHYSICAL EXAM: BP 108/69   Pulse 60   Temp (!) 97.5 F (36.4 C) (Temporal)   Resp 17   Ht '6\' 3"'  (1.905 m)   Wt 200 lb 6.4 oz (90.9 kg)   SpO2 97%   BMI 25.05 kg/m   Physical Exam  General appearance - alert, well appearing, older African-American male and in no distress Mental status - normal mood, behavior, speech, dress, motor activity, and thought processes Mouth - mucous membranes moist, pharynx normal without lesions.  Patient has dentures above and below Neck - supple, no significant adenopathy Chest - clear to auscultation, no wheezes, rales or rhonchi, symmetric air entry Heart - normal rate, regular rhythm, normal S1, S2, no murmurs, rubs, clicks or gallops Extremities - peripheral pulses normal, no  pedal edema, no clubbing or cyanosis Diabetic Foot Exam - Simple   Simple Foot Form Visual Inspection See comments: Yes Sensation Testing Intact to touch and monofilament testing bilaterally: Yes Pulse Check Posterior Tibialis and Dorsalis pulse intact bilaterally: Yes Comments Pt is flat footed.  He has noninflamed bunions on both big toes.  Mild varicosities on the dorsal surface of both feet.     CMP Latest Ref Rng & Units 03/04/2019 02/06/2019 02/04/2019  Glucose 65 - 99 mg/dL 208(H) 189(H) 289(H)  BUN 8 - 27 mg/dL '15 10 12  ' Creatinine 0.76 - 1.27 mg/dL 1.14 1.15 1.15  Sodium 134 -  144 mmol/L 138 139 135  Potassium 3.5 - 5.2 mmol/L 4.2 3.8 3.8  Chloride 96 - 106 mmol/L 101 107 101  CO2 20 - 29 mmol/L '24 22 23  ' Calcium 8.6 - 10.2 mg/dL 9.6 9.2 9.6  Total Protein 6.0 - 8.5 g/dL 6.7 - -  Total Bilirubin 0.0 - 1.2 mg/dL 1.7(H) - -  Alkaline Phos 39 - 117 IU/L 124(H) - -  AST 0 - 40 IU/L 23 - -  ALT 0 - 44 IU/L 47(H) - -   Lipid Panel     Component Value Date/Time   CHOL 105 03/04/2019 0859   TRIG 77 03/04/2019 0859   HDL 41 03/04/2019 0859   CHOLHDL 2.6 03/04/2019 0859   LDLCALC 48 03/04/2019 0859    CBC    Component Value Date/Time   WBC 6.7 03/04/2019 0859   WBC 9.6 02/06/2019 0350   RBC 4.72 03/04/2019 0859   RBC 4.35 02/06/2019 0350   HGB 13.1 03/04/2019 0859   HCT 40.1 03/04/2019 0859   PLT 203 03/04/2019 0859   MCV 85 03/04/2019 0859   MCH 27.8 03/04/2019 0859   MCH 28.0 02/06/2019 0350   MCHC 32.7 03/04/2019 0859   MCHC 31.7 02/06/2019 0350   RDW 11.4 (L) 03/04/2019 0859   LYMPHSABS 2.3 03/04/2019 0859   EOSABS 0.2 03/04/2019 0859   BASOSABS 0.0 03/04/2019 0859    ASSESSMENT AND PLAN:  1. Encounter to establish care 2. Type 2 diabetes mellitus with other circulatory complication, without long-term current use of insulin ([place]) Discussed diagnosis, management, potential complications of diabetes.  Discussed the importance of changes in eating  habits, regular exercise and taking medications if prescribed to better controlled diabetes We discussed putting him on Metformin 500 mg twice a day.  I told patient that the medication sometimes can cause GI upset in particular diarrhea. We will send prescription to his pharmacy for diabetic testing supplies.  Advised to check blood sugars at least once a day before a meal.  The goal is 90-130.  I also went over signs and symptoms of hypoglycemia and how to treat it. - Microalbumin/Creatinine Ratio, Urine - Glucose (CBG) - Ambulatory referral to Ophthalmology - metFORMIN (GLUCOPHAGE) 500 MG tablet; Take 1 tablet (500 mg total) by mouth 2 (two) times daily with a meal.  Dispense: 180 tablet; Refill: 3  3. Coronary artery disease involving coronary bypass graft of native heart without angina pectoris Clinically stable.  He will continue his current medications including aspirin, Brilinta, metoprolol and atorvastatin  4. Cocaine use disorder, mild, in early remission ([place]) Commended him on quitting.  Encouraged him to remain free of street drug.   5.  Pt decline Hep C screening However after patient left, I note that his last chemistry revealed mild elevation in bilirubin, alk phos and ALT.  I tried call him but got VM.  I will have my CMA reach out to him.  Patient was given the opportunity to ask questions.  Patient verbalized understanding of the plan and was able to repeat key elements of the plan.   Orders Placed This Encounter  Procedures  . Microalbumin/Creatinine Ratio, Urine  . Ambulatory referral to Ophthalmology  . Glucose (CBG)     Requested Prescriptions   Signed Prescriptions Disp Refills  . metFORMIN (GLUCOPHAGE) 500 MG tablet 180 tablet 3    Sig: Take 1 tablet (500 mg total) by mouth 2 (two) times daily with a meal.  . Blood Glucose Monitoring Suppl (ACCU-CHEK GUIDE  ME) w/Device KIT 1 kit 0    Sig: 1 each by Does not apply route daily. Use to check FSBS fasting daily.  Dx: E11.59  . glucose blood (ACCU-CHEK GUIDE) test strip 100 each 1    Sig: Use to check FSBS fasting daily. Dx: E11.59  . Lancets Misc. (ACCU-CHEK FASTCLIX LANCET) KIT 1 kit 0    Sig: Use to check FSBS fasting daily. Dx: E11.59  . Accu-Chek FastClix Lancets MISC 102 each 1    Sig: Use to check FSBS fasting daily. Dx: E11.59    Return in about 2 months (around 06/13/2019) for DM f/up.  Karle Plumber, MD, FACP

## 2019-04-15 NOTE — Patient Instructions (Signed)
Diabetes Mellitus and Standards of Medical Care Managing diabetes (diabetes mellitus) can be complicated. Your diabetes treatment may be managed by a team of health care providers, including:  A physician who specializes in diabetes (endocrinologist).  A nurse practitioner or physician assistant.  Nurses.  A diet and nutrition specialist (registered dietitian).  A certified diabetes educator (CDE).  An exercise specialist.  A pharmacist.  An eye doctor.  A foot specialist (podiatrist).  A dentist.  A primary care provider.  A mental health provider. Your health care providers follow guidelines to help you get the best quality of care. The following schedule is a general guideline for your diabetes management plan. Your health care providers may give you more specific instructions. Physical exams Upon being diagnosed with diabetes mellitus, and each year after that, your health care provider will ask about your medical and family history. He or she will also do a physical exam. Your exam may include:  Measuring your height, weight, and body mass index (BMI).  Checking your blood pressure. This will be done at every routine medical visit. Your target blood pressure may vary depending on your medical conditions, your age, and other factors.  Thyroid gland exam.  Skin exam.  Screening for damage to your nerves (peripheral neuropathy). This may include checking the pulse in your legs and feet and checking the level of sensation in your hands and feet.  A complete foot exam to inspect the structure and skin of your feet, including checking for cuts, bruises, redness, blisters, sores, or other problems.  Screening for blood vessel (vascular) problems, which may include checking the pulse in your legs and feet and checking your temperature. Blood tests Depending on your treatment plan and your personal needs, you may have the following tests done:  HbA1c (hemoglobin A1c). This  test provides information about blood sugar (glucose) control over the previous 2-3 months. It is used to adjust your treatment plan, if needed. This test will be done: ? At least 2 times a year, if you are meeting your treatment goals. ? 4 times a year, if you are not meeting your treatment goals or if treatment goals have changed.  Lipid testing, including total, LDL, and HDL cholesterol and triglyceride levels. ? The goal for LDL is less than 100 mg/dL (5.5 mmol/L). If you are at high risk for complications, the goal is less than 70 mg/dL (3.9 mmol/L). ? The goal for HDL is 40 mg/dL (2.2 mmol/L) or higher for men and 50 mg/dL (2.8 mmol/L) or higher for women. An HDL cholesterol of 60 mg/dL (3.3 mmol/L) or higher gives some protection against heart disease. ? The goal for triglycerides is less than 150 mg/dL (8.3 mmol/L).  Liver function tests.  Kidney function tests.  Thyroid function tests. Dental and eye exams  Visit your dentist two times a year.  If you have type 1 diabetes, your health care provider may recommend an eye exam 3-5 years after you are diagnosed, and then once a year after your first exam. ? For children with type 1 diabetes, a health care provider may recommend an eye exam when your child is age 10 or older and has had diabetes for 3-5 years. After the first exam, your child should get an eye exam once a year.  If you have type 2 diabetes, your health care provider may recommend an eye exam as soon as you are diagnosed, and then once a year after your first exam. Immunizations   The   yearly flu (influenza) vaccine is recommended for everyone 6 months or older who has diabetes.  The pneumonia (pneumococcal) vaccine is recommended for everyone 2 years or older who has diabetes. If you are 65 or older, you may get the pneumonia vaccine as a series of two separate shots.  The hepatitis B vaccine is recommended for adults shortly after being diagnosed with  diabetes.  Adults and children with diabetes should receive all other vaccines according to age-specific recommendations from the Centers for Disease Control and Prevention (CDC). Mental and emotional health Screening for symptoms of eating disorders, anxiety, and depression is recommended at the time of diagnosis and afterward as needed. If your screening shows that you have symptoms (positive screening result), you may need more evaluation and you may work with a mental health care provider. Treatment plan Your treatment plan will be reviewed at every medical visit. You and your health care provider will discuss:  How you are taking your medicines, including insulin.  Any side effects you are experiencing.  Your blood glucose target goals.  The frequency of your blood glucose monitoring.  Lifestyle habits, such as activity level as well as tobacco, alcohol, and substance use. Diabetes self-management education Your health care provider will assess how well you are monitoring your blood glucose levels and whether you are taking your insulin correctly. He or she may refer you to:  A certified diabetes educator to manage your diabetes throughout your life, starting at diagnosis.  A registered dietitian who can create or review your personal nutrition plan.  An exercise specialist who can discuss your activity level and exercise plan. Summary  Managing diabetes (diabetes mellitus) can be complicated. Your diabetes treatment may be managed by a team of health care providers.  Your health care providers follow guidelines in order to help you get the best quality of care.  Standards of care including having regular physical exams, blood tests, blood pressure monitoring, immunizations, screening tests, and education about how to manage your diabetes.  Your health care providers may also give you more specific instructions based on your individual health. This information is not intended  to replace advice given to you by your health care provider. Make sure you discuss any questions you have with your health care provider. Document Revised: 11/30/2017 Document Reviewed: 12/10/2015 Elsevier Patient Education  2020 Elsevier Inc.   Diabetes Mellitus and Nutrition, Adult When you have diabetes (diabetes mellitus), it is very important to have healthy eating habits because your blood sugar (glucose) levels are greatly affected by what you eat and drink. Eating healthy foods in the appropriate amounts, at about the same times every day, can help you:  Control your blood glucose.  Lower your risk of heart disease.  Improve your blood pressure.  Reach or maintain a healthy weight. Every person with diabetes is different, and each person has different needs for a meal plan. Your health care provider may recommend that you work with a diet and nutrition specialist (dietitian) to make a meal plan that is best for you. Your meal plan may vary depending on factors such as:  The calories you need.  The medicines you take.  Your weight.  Your blood glucose, blood pressure, and cholesterol levels.  Your activity level.  Other health conditions you have, such as heart or kidney disease. How do carbohydrates affect me? Carbohydrates, also called carbs, affect your blood glucose level more than any other type of food. Eating carbs naturally raises the amount of   glucose in your blood. Carb counting is a method for keeping track of how many carbs you eat. Counting carbs is important to keep your blood glucose at a healthy level, especially if you use insulin or take certain oral diabetes medicines. It is important to know how many carbs you can safely have in each meal. This is different for every person. Your dietitian can help you calculate how many carbs you should have at each meal and for each snack. Foods that contain carbs include:  Bread, cereal, rice, pasta, and  crackers.  Potatoes and corn.  Peas, beans, and lentils.  Milk and yogurt.  Fruit and juice.  Desserts, such as cakes, cookies, ice cream, and candy. How does alcohol affect me? Alcohol can cause a sudden decrease in blood glucose (hypoglycemia), especially if you use insulin or take certain oral diabetes medicines. Hypoglycemia can be a life-threatening condition. Symptoms of hypoglycemia (sleepiness, dizziness, and confusion) are similar to symptoms of having too much alcohol. If your health care provider says that alcohol is safe for you, follow these guidelines:  Limit alcohol intake to no more than 1 drink per day for nonpregnant women and 2 drinks per day for men. One drink equals 12 oz of beer, 5 oz of wine, or 1 oz of hard liquor.  Do not drink on an empty stomach.  Keep yourself hydrated with water, diet soda, or unsweetened iced tea.  Keep in mind that regular soda, juice, and other mixers may contain a lot of sugar and must be counted as carbs. What are tips for following this plan?  Reading food labels  Start by checking the serving size on the "Nutrition Facts" label of packaged foods and drinks. The amount of calories, carbs, fats, and other nutrients listed on the label is based on one serving of the item. Many items contain more than one serving per package.  Check the total grams (g) of carbs in one serving. You can calculate the number of servings of carbs in one serving by dividing the total carbs by 15. For example, if a food has 30 g of total carbs, it would be equal to 2 servings of carbs.  Check the number of grams (g) of saturated and trans fats in one serving. Choose foods that have low or no amount of these fats.  Check the number of milligrams (mg) of salt (sodium) in one serving. Most people should limit total sodium intake to less than 2,300 mg per day.  Always check the nutrition information of foods labeled as "low-fat" or "nonfat". These foods may be  higher in added sugar or refined carbs and should be avoided.  Talk to your dietitian to identify your daily goals for nutrients listed on the label. Shopping  Avoid buying canned, premade, or processed foods. These foods tend to be high in fat, sodium, and added sugar.  Shop around the outside edge of the grocery store. This includes fresh fruits and vegetables, bulk grains, fresh meats, and fresh dairy. Cooking  Use low-heat cooking methods, such as baking, instead of high-heat cooking methods like deep frying.  Cook using healthy oils, such as olive, canola, or sunflower oil.  Avoid cooking with butter, cream, or high-fat meats. Meal planning  Eat meals and snacks regularly, preferably at the same times every day. Avoid going long periods of time without eating.  Eat foods high in fiber, such as fresh fruits, vegetables, beans, and whole grains. Talk to your dietitian about how many   servings of carbs you can eat at each meal.  Eat 4-6 ounces (oz) of lean protein each day, such as lean meat, chicken, fish, eggs, or tofu. One oz of lean protein is equal to: ? 1 oz of meat, chicken, or fish. ? 1 egg. ?  cup of tofu.  Eat some foods each day that contain healthy fats, such as avocado, nuts, seeds, and fish. Lifestyle  Check your blood glucose regularly.  Exercise regularly as told by your health care provider. This may include: ? 150 minutes of moderate-intensity or vigorous-intensity exercise each week. This could be brisk walking, biking, or water aerobics. ? Stretching and doing strength exercises, such as yoga or weightlifting, at least 2 times a week.  Take medicines as told by your health care provider.  Do not use any products that contain nicotine or tobacco, such as cigarettes and e-cigarettes. If you need help quitting, ask your health care provider.  Work with a counselor or diabetes educator to identify strategies to manage stress and any emotional and social  challenges. Questions to ask a health care provider  Do I need to meet with a diabetes educator?  Do I need to meet with a dietitian?  What number can I call if I have questions?  When are the best times to check my blood glucose? Where to find more information:  American Diabetes Association: diabetes.org  Academy of Nutrition and Dietetics: www.eatright.org  National Institute of Diabetes and Digestive and Kidney Diseases (NIH): www.niddk.nih.gov Summary  A healthy meal plan will help you control your blood glucose and maintain a healthy lifestyle.  Working with a diet and nutrition specialist (dietitian) can help you make a meal plan that is best for you.  Keep in mind that carbohydrates (carbs) and alcohol have immediate effects on your blood glucose levels. It is important to count carbs and to use alcohol carefully. This information is not intended to replace advice given to you by your health care provider. Make sure you discuss any questions you have with your health care provider. Document Revised: 02/23/2017 Document Reviewed: 04/17/2016 Elsevier Patient Education  2020 Elsevier Inc.  

## 2019-04-16 ENCOUNTER — Other Ambulatory Visit: Payer: Medicaid Other | Admitting: *Deleted

## 2019-04-16 DIAGNOSIS — R7989 Other specified abnormal findings of blood chemistry: Secondary | ICD-10-CM

## 2019-04-16 DIAGNOSIS — R945 Abnormal results of liver function studies: Secondary | ICD-10-CM

## 2019-04-16 DIAGNOSIS — I2510 Atherosclerotic heart disease of native coronary artery without angina pectoris: Secondary | ICD-10-CM

## 2019-04-16 LAB — HEPATIC FUNCTION PANEL
ALT: 49 IU/L — ABNORMAL HIGH (ref 0–44)
AST: 19 IU/L (ref 0–40)
Albumin: 4.4 g/dL (ref 3.8–4.8)
Alkaline Phosphatase: 134 IU/L — ABNORMAL HIGH (ref 39–117)
Bilirubin Total: 2.5 mg/dL — ABNORMAL HIGH (ref 0.0–1.2)
Bilirubin, Direct: 0.18 mg/dL (ref 0.00–0.40)
Total Protein: 7 g/dL (ref 6.0–8.5)

## 2019-04-16 LAB — MICROALBUMIN / CREATININE URINE RATIO
Creatinine, Urine: 98.3 mg/dL
Microalb/Creat Ratio: 10 mg/g creat (ref 0–29)
Microalbumin, Urine: 9.5 ug/mL

## 2019-04-17 ENCOUNTER — Telehealth: Payer: Self-pay

## 2019-04-17 MED ORDER — ATORVASTATIN CALCIUM 40 MG PO TABS: 40.0000 mg | ORAL_TABLET | Freq: Every day | ORAL | 3 refills | Status: DC

## 2019-04-17 NOTE — Telephone Encounter (Signed)
Pt advise and verbalized understanding of his lab results... he is seeing Dr. Laural Benes re: his liver function tests and will decrease his Lipitor to 40 mg a day.

## 2019-04-17 NOTE — Telephone Encounter (Signed)
-----   Message from Corrin Parker, [place] sent at 04/17/2019  2:08 PM EST ----- Please notify patient of results: 2 of liver enzymes are still slightly elevated but relatively stable. However, total Bilirubin is higher. Looks like patient's PCP has recommended work-up for Hepatitis C which I agree with. LDL (bad cholesterol) was well below goal of <70 at last check, so OK to decrease Lipitor to 40mg  daily. But recommend following up with PCP.  Please send results to PCP as well.   Thank you!

## 2019-04-18 NOTE — Telephone Encounter (Signed)
Left voice mail to call back 

## 2019-04-24 ENCOUNTER — Other Ambulatory Visit: Payer: Self-pay

## 2019-04-24 MED ORDER — ONETOUCH ULTRA 2 W/DEVICE KIT: PACK | 0 refills | Status: DC

## 2019-04-24 MED ORDER — ONETOUCH ULTRA VI STRP: ORAL_STRIP | 2 refills | Status: DC

## 2019-04-24 MED ORDER — ONETOUCH DELICA LANCETS 30G MISC: 1.0000 | Freq: Every day | 2 refills | Status: DC

## 2019-05-19 NOTE — Progress Notes (Deleted)
Cardiology Office Note:    Date:  05/19/2019   ID:  Adam [name], DOB 23-Aug-1955, MRN [id number]  PCP:  Patient, No Pcp Per  Cardiologist:  Sherren Mocha, MD *** Electrophysiologist:  None   Referring MD: No ref. provider found   Chief Complaint:  No chief complaint on file.    Patient Profile:    Adam [name] is a 64 y.o. male with:   Coronary artery disease  S/p NSTEMI 01/2019 tx with DES to pLCx and thrombectomy/POBA to LPAV  Hyperlipidemia   Diabetes mellitus   Hx of cocaine use   Prior CV studies: Echocardiogram 02/06/2019 EF 50-55, inf/inf-lat HK, borderline LVH, Gr 1 DD, pericardial effusion, mild MAC, mild MR, mild TR, dilated aortic root (41 mm), ascending aorta 40 mm, RVSP 22.6  Cardiac catheterization  LAD ost 60, mid 25; D1 50 LCx prox 95, dist 60; LPDA 95; LPAV 100 RCA prox 95 (too small for PCI) EF 50-55 PCI:  3 x 22 mm Resolute Onyx DES to prox and dist Cx PCI:  Aspiration thrombectomy/POBA to LPAV unsuccessful (100>>60)   History of Present Illness:    Adam [name] was last seen in clinic in 01/2019 by Sande Rives, PA-C for post hospital follow up.    The DICTATELATER SmartLink is not supported in this context. ***   Past Medical History:  Diagnosis Date  . Coronary artery disease   . Diabetes mellitus without complication ([place])   . Myocardial infarction ([place])    PCI/DES to pLCx, PTCA to PDA, small nondominent RCA with 95%  . Occasional recreational drug use     Current Medications: No outpatient medications have been marked as taking for the 05/20/19 encounter (Appointment) with Richardson Dopp T, PA-C.     Allergies:   Patient has no known allergies.   Social History   Tobacco Use  . Smoking status: Never Smoker  . Smokeless tobacco: Never Used  Substance Use Topics  . Alcohol use: Yes    Comment: SOCIAL  . Drug use: Not Currently    Types: Cocaine     Family Hx: The patient's family history includes Breast cancer in  his sister; Diabetes in his father; Heart disease in his mother; Multiple sclerosis in his sister; Stomach cancer in his sister. There is no history of Healthy.  ROS   EKGs/Labs/Other Test Reviewed:    EKG:  EKG is *** ordered today.  The ekg ordered today demonstrates ***  Recent Labs: 03/04/2019: BUN 15; Creatinine, Ser 1.14; Hemoglobin 13.1; Platelets 203; Potassium 4.2; Sodium 138 04/16/2019: ALT 49   Recent Lipid Panel Lab Results  Component Value Date/Time   CHOL 105 03/04/2019 08:59 AM   TRIG 77 03/04/2019 08:59 AM   HDL 41 03/04/2019 08:59 AM   CHOLHDL 2.6 03/04/2019 08:59 AM   LDLCALC 48 03/04/2019 08:59 AM    Physical Exam:    VS:  There were no vitals taken for this visit.    Wt Readings from Last 3 Encounters:  04/15/19 200 lb 6.4 oz (90.9 kg)  02/18/19 201 lb 6.4 oz (91.4 kg)  02/06/19 205 lb 6.4 oz (93.2 kg)     Physical Exam ***  ASSESSMENT & PLAN:    ***  Dispo:  No follow-ups on file.   Medication Adjustments/Labs and Tests Ordered: Current medicines are reviewed at length with the patient today.  Concerns regarding medicines are outlined above.  Tests Ordered: No orders of the defined types were placed in this encounter.  Medication Changes: No orders of the defined types were placed in this encounter.   Signed, Richardson Dopp, PA-C  05/19/2019 1:18 PM    [facility] [address], [place], [place]  [address] Phone: [phone]; Fax: [phone]

## 2019-05-20 ENCOUNTER — Ambulatory Visit: Payer: Self-pay | Admitting: Physician Assistant

## 2019-06-13 ENCOUNTER — Ambulatory Visit: Payer: Medicaid Other

## 2019-06-13 ENCOUNTER — Telehealth: Payer: Self-pay

## 2019-06-13 ENCOUNTER — Other Ambulatory Visit: Payer: Self-pay

## 2019-06-13 ENCOUNTER — Other Ambulatory Visit: Payer: Self-pay | Admitting: Family Medicine

## 2019-06-13 DIAGNOSIS — I10 Essential (primary) hypertension: Secondary | ICD-10-CM

## 2019-06-13 MED ORDER — ACCU-CHEK SOFTCLIX LANCETS MISC: 12 refills | Status: DC

## 2019-06-13 MED ORDER — BLOOD PRESSURE KIT: PACK | 0 refills | Status: AC

## 2019-06-13 MED ORDER — ACCU-CHEK AVIVA DEVI: 0 refills | Status: DC

## 2019-06-13 MED ORDER — ACCU-CHEK AVIVA PLUS VI STRP: ORAL_STRIP | 12 refills | Status: DC

## 2019-06-13 MED ORDER — BLOOD PRESSURE KIT: PACK | 0 refills | Status: DC

## 2019-06-13 NOTE — Progress Notes (Signed)
Will fax the BP machine Rx To [facility].

## 2019-06-13 NOTE — Progress Notes (Signed)
Reordered glucometer per patient last monitor ordered not covered by insurance.  BP device order faxed to [facility].

## 2019-06-13 NOTE — Telephone Encounter (Signed)
New prescription was sent to pt pharmacy as requested/ BP machine prescription was sent via fax to [facility] / confirmation received / called pt and notified

## 2019-06-13 NOTE — Progress Notes (Addendum)
DOB and name verified   Pt did not check BG and BP at home as instructed on his last visit / did not have the supply Denies any distress. Pt need new orders for glucometer and BP machine at a more reasonable pricing

## 2019-06-13 NOTE — Telephone Encounter (Signed)
Pharmacy is requesting changes to Rx written today- please review request

## 2019-06-15 NOTE — Telephone Encounter (Signed)
Montel Clock,  Please follow-up with the pharmacy to clarify glucometer and blood glucose testing supplies and fax script to refax-pharmacy. We faxed a prescription on Friday and apparently items not covered.  Thanks,  Joaquin Courts, FNP

## 2019-06-15 NOTE — Telephone Encounter (Signed)
Please contact pharmacy and clarify testing supplies covered by patient insurance and fax revised script to pharmacy

## 2019-06-16 ENCOUNTER — Other Ambulatory Visit: Payer: Self-pay

## 2019-06-16 DIAGNOSIS — I10 Essential (primary) hypertension: Secondary | ICD-10-CM

## 2019-06-16 DIAGNOSIS — Z1329 Encounter for screening for other suspected endocrine disorder: Secondary | ICD-10-CM

## 2019-06-16 DIAGNOSIS — E1159 Type 2 diabetes mellitus with other circulatory complications: Secondary | ICD-10-CM

## 2019-06-16 MED ORDER — ACCU-CHEK GUIDE ME W/DEVICE KIT: 1.0000 | PACK | Freq: Every day | 0 refills | Status: DC

## 2019-06-16 MED ORDER — ACCU-CHEK FASTCLIX LANCETS MISC: 2 refills | Status: DC

## 2019-06-16 MED ORDER — ACCU-CHEK FASTCLIX LANCET KIT: PACK | 0 refills | Status: DC

## 2019-06-16 NOTE — Addendum Note (Signed)
Addended by: Heidi Dach on: 06/16/2019 09:22 AM   Modules accepted: Orders

## 2019-06-16 NOTE — Telephone Encounter (Signed)
Alternative Rx requested by pharmacy- sent for PCP review

## 2019-06-16 NOTE — Progress Notes (Deleted)
See notes

## 2019-06-18 ENCOUNTER — Other Ambulatory Visit: Payer: Self-pay

## 2019-06-18 ENCOUNTER — Ambulatory Visit (INDEPENDENT_AMBULATORY_CARE_PROVIDER_SITE_OTHER): Payer: Self-pay

## 2019-06-18 DIAGNOSIS — Z1329 Encounter for screening for other suspected endocrine disorder: Secondary | ICD-10-CM

## 2019-06-18 DIAGNOSIS — E1159 Type 2 diabetes mellitus with other circulatory complications: Secondary | ICD-10-CM

## 2019-06-18 DIAGNOSIS — I10 Essential (primary) hypertension: Secondary | ICD-10-CM

## 2019-06-18 NOTE — Progress Notes (Signed)
Patient here for BP check & repeat labs. Patient states that he hasn't taken medication this morn After sitting BP was 110/66, pulse 63. Spoke with provider & she states to have patient continue medications & stop to check out to schedule his routine DM/BP/Chol f/up. KWalker, CMA.

## 2019-06-19 LAB — COMPREHENSIVE METABOLIC PANEL
ALT: 22 IU/L (ref 0–44)
AST: 20 IU/L (ref 0–40)
Albumin/Globulin Ratio: 1.9 (ref 1.2–2.2)
Albumin: 4.4 g/dL (ref 3.8–4.8)
Alkaline Phosphatase: 85 IU/L (ref 39–117)
BUN/Creatinine Ratio: 15 (ref 10–24)
BUN: 15 mg/dL (ref 8–27)
Bilirubin Total: 1.3 mg/dL — ABNORMAL HIGH (ref 0.0–1.2)
CO2: 24 mmol/L (ref 20–29)
Calcium: 9.2 mg/dL (ref 8.6–10.2)
Chloride: 104 mmol/L (ref 96–106)
Creatinine, Ser: 1.03 mg/dL (ref 0.76–1.27)
GFR calc Af Amer: 88 mL/min/{1.73_m2} (ref >59)
GFR calc non Af Amer: 76 mL/min/{1.73_m2} (ref >59)
Globulin, Total: 2.3 g/dL (ref 1.5–4.5)
Glucose: 145 mg/dL — ABNORMAL HIGH (ref 65–99)
Potassium: 4.1 mmol/L (ref 3.5–5.2)
Sodium: 141 mmol/L (ref 134–144)
Total Protein: 6.7 g/dL (ref 6.0–8.5)

## 2019-06-19 LAB — HEMOGLOBIN A1C
Est. average glucose Bld gHb Est-mCnc: 131 mg/dL
Hgb A1c MFr Bld: 6.2 % — ABNORMAL HIGH (ref 4.8–5.6)

## 2019-06-19 LAB — TSH: TSH: 1.33 u[IU]/mL (ref 0.450–4.500)

## 2019-06-20 NOTE — Progress Notes (Signed)
Mailed normal lab letter

## 2019-06-24 NOTE — Progress Notes (Deleted)
Refer to notes in epic/

## 2019-09-23 ENCOUNTER — Other Ambulatory Visit: Payer: Self-pay | Admitting: Cardiovascular Disease

## 2019-10-21 ENCOUNTER — Telehealth (INDEPENDENT_AMBULATORY_CARE_PROVIDER_SITE_OTHER): Payer: 59 | Admitting: Internal Medicine

## 2019-10-21 ENCOUNTER — Encounter: Payer: Self-pay | Admitting: Internal Medicine

## 2019-10-21 DIAGNOSIS — E1159 Type 2 diabetes mellitus with other circulatory complications: Secondary | ICD-10-CM

## 2019-10-21 DIAGNOSIS — I10 Essential (primary) hypertension: Secondary | ICD-10-CM

## 2019-10-21 DIAGNOSIS — I25810 Atherosclerosis of coronary artery bypass graft(s) without angina pectoris: Secondary | ICD-10-CM | POA: Diagnosis not present

## 2019-10-21 MED ORDER — ACCU-CHEK FASTCLIX LANCET KIT: PACK | 0 refills | Status: AC

## 2019-10-21 MED ORDER — ACCU-CHEK GUIDE VI STRP: ORAL_STRIP | 2 refills | Status: DC

## 2019-10-21 MED ORDER — ACCU-CHEK GUIDE ME W/DEVICE KIT: PACK | 0 refills | Status: DC

## 2019-10-21 MED ORDER — ACCU-CHEK FASTCLIX LANCETS MISC: 2 refills | Status: DC

## 2019-10-21 NOTE — Progress Notes (Signed)
Virtual Visit via Telephone Note  I connected with Adam [name], on 10/21/2019 at 9:10 AM by telephone due to the COVID-19 pandemic and verified that I am speaking with the correct person using two identifiers.   Consent: I discussed the limitations, risks, security and privacy concerns of performing an evaluation and management service by telephone and the availability of in person appointments. I also discussed with the patient that there may be a patient responsible charge related to this service. The patient expressed understanding and agreed to proceed.   Location of Patient: Home   Location of Provider: Clinic    Persons participating in Telemedicine visit: Khamani C Conchas Kierra Walker-CMA Dr. Wallace      History of Present Illness: Patient has follow up visit for chronic medical conditions.   Diabetes mellitus, Type 2 Disease Monitoring             Blood Sugar Ranges: Has not been able to check CBGs due to being unable to afford glucose monitoring supplies.              Polyuria: no              Visual problems: no   Urine Microalbumin: 10 (Jan 2021)   Last A1C: 6.2 (March 2021)   Medications: Metformin 500 mg BID  Medication Compliance: yes  Medication Side Effects             Hypoglycemia: no   Chronic HTN Disease Monitoring:  Home BP Monitoring - Has a cuff but hasnt checked it lately. Last time he checked it, it was "real good".  Chest pain- no  Dyspnea- no Headache - no  Medications: Metoprolol 12.5 mg BID  Compliance- yes Lightheadedness- no  Edema- no    Past Medical History:  Diagnosis Date  . Coronary artery disease   . Diabetes mellitus without complication ([facility])   . Myocardial infarction ([facility])    PCI/DES to pLCx, PTCA to PDA, small nondominent RCA with 95%  . Occasional recreational drug use    No Known Allergies  Current Outpatient Medications on File Prior to Visit  Medication Sig Dispense Refill  . Accu-Chek  FastClix Lancets MISC Use to check FSBS fasting daily and once 2 hours after dinner. Dx: E11.59 102 each 2  . aspirin EC 81 MG EC tablet Take 1 tablet (81 mg total) by mouth daily. 90 tablet 3  . atorvastatin (LIPITOR) 40 MG tablet Take 1 tablet (40 mg total) by mouth daily at 6 PM. 90 tablet 3  . Blood Glucose Monitoring Suppl (ACCU-CHEK AVIVA) device Use to check FSBS fasting daily and once 2 hours after dinner. Dx: E11.59 1 each 0  . Blood Glucose Monitoring Suppl (ACCU-CHEK GUIDE ME) w/Device KIT 1 each by Does not apply route daily. Dx: E11.59 1 kit 0  . Blood Pressure KIT Check blood pressure once to twice daily. Notify PCP if readings greater than 160/100. ICD10 code-I10 dispense brand covered by health insurance plan 1 kit 0  . glucose blood (ACCU-CHEK GUIDE) test strip Use to check FSBS fasting daily and once 2 hours after dinner. Dx: E11.59 200 each 2  . Lancets Misc. (ACCU-CHEK FASTCLIX LANCET) KIT Use to check FSBS fasting daily and once 2 hours after dinner. Dx: E11.59 1 kit 0  . metFORMIN (GLUCOPHAGE) 500 MG tablet Take 1 tablet (500 mg total) by mouth 2 (two) times daily with a meal. 180 tablet 3  . metoprolol tartrate (LOPRESSOR) 25 MG tablet TAKE   1/2 TABLETS (12.5 MG TOTAL) BY MOUTH TWO TIMES DAILY. 90 tablet 1  . Multiple Vitamins-Minerals (MULTI FOR HIM PO) Take by mouth. gummies with omega 3    . nitroGLYCERIN (NITROSTAT) 0.4 MG SL tablet Place 1 tablet (0.4 mg total) under the tongue every 5 (five) minutes as needed for chest pain. (Patient not taking: Reported on 06/13/2019) 25 tablet 2  . RESTASIS 0.05 % ophthalmic emulsion Place 1 drop into both eyes 2 (two) times daily.    . ticagrelor (BRILINTA) 90 MG TABS tablet Take 1 tablet (90 mg total) by mouth 2 (two) times daily. 180 tablet 2   No current facility-administered medications on file prior to visit.    Observations/Objective: NAD. Speaking clearly.  Work of breathing normal.  Alert and oriented. Mood appropriate.    Assessment and Plan: 1. Type 2 diabetes mellitus with other circulatory complication, without long-term current use of insulin (HCC) Prior A1c shows good glucose control. Not monitoring at home due to lack of supplies. Will order supplies and repeat A1c.  Counseled on Diabetic diet, my plate method, 150 minutes of moderate intensity exercise/week Blood sugar logs with fasting goals of 80-120 mg/dl, random of less than 180 and in the event of sugars less than 60 mg/dl or greater than 400 mg/dl encouraged to notify the clinic. Advised on the need for annual eye exams, annual foot exams, Pneumonia vaccine. - Hemoglobin A1c; Future - RESTASIS 0.05 % ophthalmic emulsion; Place 1 drop into both eyes 2 (two) times daily. - Accu-Chek FastClix Lancets MISC; Use to check FSBS fasting daily. Dx: E11.59  Dispense: 102 each; Refill: 2 - Blood Glucose Monitoring Suppl (ACCU-CHEK GUIDE ME) w/Device KIT; Use to check FSBS fasting daily. Dx: E11.59  Dispense: 1 kit; Refill: 0 - Lancets Misc. (ACCU-CHEK FASTCLIX LANCET) KIT; Use to check FSBS fasting daily. Dx: E11.59  Dispense: 1 kit; Refill: 0 - glucose blood (ACCU-CHEK GUIDE) test strip; Use to check FSBS fasting daily. Dx: E11.59  Dispense: 100 each; Refill: 2  2. Essential hypertension Unknown BP trend but asymptomatic. Continue current regimen.   3. Coronary artery disease involving coronary bypass graft of native heart without angina pectoris Has not needed to use nitroglycerin. Stable on statin, ASA, Brilinta.    Follow Up Instructions: Lab visit 7/28, 3 month f/u for chronic medical conditions    I discussed the assessment and treatment plan with the patient. The patient was provided an opportunity to ask questions and all were answered. The patient agreed with the plan and demonstrated an understanding of the instructions.   The patient was advised to call back or seek an in-person evaluation if the symptoms worsen or if the condition fails to  improve as anticipated.     I provided 20 minutes total of non-face-to-face time during this encounter including median intraservice time, reviewing previous notes, investigations, ordering medications, medical decision making, coordinating care and patient verbalized understanding at the end of the visit.    Catherine Wallace, D.O. [facility]  10/21/2019, 9:10 AM 

## 2019-10-22 ENCOUNTER — Other Ambulatory Visit: Payer: Self-pay

## 2019-10-22 ENCOUNTER — Ambulatory Visit: Payer: Medicaid Other | Admitting: Internal Medicine

## 2019-10-22 ENCOUNTER — Other Ambulatory Visit (INDEPENDENT_AMBULATORY_CARE_PROVIDER_SITE_OTHER): Payer: 59

## 2019-10-22 DIAGNOSIS — E1159 Type 2 diabetes mellitus with other circulatory complications: Secondary | ICD-10-CM

## 2019-10-23 LAB — HEMOGLOBIN A1C
Est. average glucose Bld gHb Est-mCnc: 137 mg/dL
Hgb A1c MFr Bld: 6.4 % — ABNORMAL HIGH (ref 4.8–5.6)

## 2019-10-24 NOTE — Progress Notes (Signed)
Normal lab letter mailed.

## 2019-10-28 ENCOUNTER — Other Ambulatory Visit: Payer: Self-pay

## 2019-10-28 DIAGNOSIS — E1159 Type 2 diabetes mellitus with other circulatory complications: Secondary | ICD-10-CM

## 2019-10-28 MED ORDER — CONTOUR MONITOR DEVI: 0 refills | Status: DC

## 2019-10-28 MED ORDER — CONTOUR TEST VI STRP: ORAL_STRIP | 12 refills | Status: DC

## 2019-10-28 MED ORDER — MICROLET NEXT LANCING DEVICE MISC: 0 refills | Status: DC

## 2019-10-28 MED ORDER — MICROLET LANCETS MISC: 1 refills | Status: DC

## 2019-11-07 ENCOUNTER — Other Ambulatory Visit: Payer: Self-pay

## 2019-11-07 MED ORDER — ONETOUCH DELICA LANCING DEV MISC: 0 refills | Status: DC

## 2019-11-07 MED ORDER — GLUCOSE BLOOD VI STRP: ORAL_STRIP | 1 refills | Status: DC

## 2019-11-07 MED ORDER — ONETOUCH ULTRA 2 W/DEVICE KIT: PACK | 0 refills | Status: DC

## 2019-11-07 MED ORDER — ONETOUCH DELICA LANCETS 33G MISC: 1 refills | Status: DC

## 2020-01-23 ENCOUNTER — Ambulatory Visit: Payer: Medicare Other | Attending: Internal Medicine

## 2020-01-23 DIAGNOSIS — Z23 Encounter for immunization: Secondary | ICD-10-CM

## 2020-01-23 NOTE — Progress Notes (Signed)
   Covid-19 Vaccination Clinic  Name:  BELA NYBORG    MRN: [id number] DOB: [birth year]  01/23/2020  Mr. Klaus was observed post Covid-19 immunization for 15 minutes without incident. He was provided with Vaccine Information Sheet and instruction to access the V-Safe system.   Mr. Schwartz was instructed to call 911 with any severe reactions post vaccine: Marland Kitchen Difficulty breathing  . Swelling of face and throat  . A fast heartbeat  . A bad rash all over body  . Dizziness and weakness

## 2020-04-20 ENCOUNTER — Other Ambulatory Visit: Payer: Self-pay | Admitting: Internal Medicine

## 2020-04-20 DIAGNOSIS — E1159 Type 2 diabetes mellitus with other circulatory complications: Secondary | ICD-10-CM

## 2020-05-06 ENCOUNTER — Other Ambulatory Visit: Payer: Self-pay | Admitting: Cardiovascular Disease

## 2020-05-28 ENCOUNTER — Other Ambulatory Visit: Payer: Self-pay | Admitting: Student

## 2020-06-08 ENCOUNTER — Other Ambulatory Visit: Payer: Self-pay | Admitting: Cardiovascular Disease

## 2020-06-30 ENCOUNTER — Other Ambulatory Visit: Payer: Self-pay | Admitting: Cardiovascular Disease

## 2020-07-31 NOTE — Progress Notes (Signed)
Cardiology Office Note   Date:  08/02/2020   ID:  Adam [name], DOB 03-09-1956, MRN [id number]  PCP:  Nicolette Bang, DO  Cardiologist:  Dr. Burt Knack, MD   Chief Complaint  Patient presents with  . Follow-up   History of Present Illness: Adam [name] is a 65 y.o. male who presents for follow-up, seen for Dr. Burt Knack.  Adam [name] has a history of CAD with NSTEMI s/p PCI/DES to proximal circumflex lesion and aspiration thrombectomy and PTCA of ostial PDA, hyperlipidemia, DM2, and cocaine use who is being seen today for follow up.   Adam [name] was admitted 02/04/2019-02/06/2019 for NSTEMI after presenting with sudden onset of chest pain with associated diaphoresis and exertional dyspnea. High-sensitivity troponin peaked at >27,000. He underwent LHC which showed severe 3 vessel disease with 95% stenosis of the proximal to mid circumflex lesion (culprit lesion), 60% stenosis of the distal circumflex lesion, 100% stenosis of LPAV with heavily concentrated thrombus, 60% stenosis of the ostial to proximal LAD, 50% stenosis of 1st Diag, and 95% stenosis of the proximal RCA (very small caliber, non-dominant). He was treated with successful PCI/DES of proximal circumflex lesion. Also treated with aspiration thrombectomy and PTCA of ostial PDA with only suboptimal result due to significant burden. Echo showed LVEF of 50-55% with regional wall motion abnormalities affecting the basal and mid inferolateral wall and basal inferior segment as well as borderline LVH, grade 1 diastolic dysfunction, pericardial fat pad, mild MR, mild TR, and mild dilatation of the aortic root measuring 41 mm. He has been maintained on Aspirin 43m daily, Brilinta 947mtwice daily, Lopressor 12.60m2mwice daily, and Lipitor 35m32mily.   He was last seen in follow-up 02/18/2020 and reported he was doing well from a CV standpoint.  Plan at that time was to continue current medication regimen and return for lipid panel  and follow-up lab work along with 3-mo11-monthow-up with Dr. CoopeBurt Knackver the patient has not been seen since this time.   Today he reports he is doing well from a CV standpoint. He denies anginal symptoms, no SOB, DOE, LE edema, palpitations or syncope. 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 He states he has not seen his PCP in quite a while. As above he was to return for follow up labs but never came. Given this, we will obtain lipid panel and HbA1c today. We also discussed changing from Brilinta to Plavix given duration since PCI. Discusses with Dr. CoopeBurt Knackagrees.   Past Medical History:  Diagnosis Date  . Coronary artery disease   . Diabetes mellitus without complication (HCC) [place] Myocardial infarction (HCC) MorralPCI/DES to pLCx, PTCA to PDA, small nondominent RCA with 95%  . Occasional recreational drug use     Past Surgical History:  Procedure Laterality Date  . CORONARY BALLOON ANGIOPLASTY N/A 02/05/2019   Procedure: CORONARY BALLOON ANGIOPLASTY;  Surgeon: HardiLeonie Man  [facility];  Service: Cardiovascular;  Laterality: N/A;  . CORONARY STENT INTERVENTION N/A 02/05/2019   Procedure: CORONARY STENT INTERVENTION;  Surgeon: HardiLeonie Man  [facility];  Service: Cardiovascular;  Laterality: N/A;  . CORONARY THROMBECTOMY N/A 02/05/2019   Procedure: Coronary Thrombectomy;  Surgeon: HardiLeonie Man  [facility];  Service: Cardiovascular;  Laterality: N/A;  .  LEFT HEART CATH AND CORONARY ANGIOGRAPHY N/A 02/05/2019   Procedure: LEFT HEART CATH AND CORONARY ANGIOGRAPHY;  Surgeon: Leonie Man, MD;  Location: [facility];  Service: Cardiovascular;  Laterality: N/A;     Current Outpatient Medications  Medication Sig Dispense Refill  . aspirin EC 81 MG EC tablet Take 1 tablet (81 mg total) by  mouth daily. 90 tablet 3  . atorvastatin (LIPITOR) 40 MG tablet TAKE 1 TABLET DAILY AT 6 PM. PLEASE MAKE OVERDUE APPT WITH DR. Burt Knack BEFORE ANYMORE REFILLS 30 tablet 2  . Blood Glucose Monitoring Suppl (ONE TOUCH ULTRA 2) w/Device KIT Use to check FSBS fasting daily. Dx: E11.59 1 kit 0  . Blood Pressure KIT Check blood pressure once to twice daily. Notify PCP if readings greater than 160/100. ICD10 code-I10 dispense brand covered by health insurance plan 1 kit 0  . clopidogrel (PLAVIX) 75 MG tablet Take 1 tablet (75 mg total) by mouth daily. 90 tablet 3  . glucose blood test strip One Touch Ultra. Use to check FSBS fasting daily. Dx: E11.59 100 each 1  . Lancet Devices (ONE TOUCH DELICA LANCING DEV) MISC Use to check FSBS fasting daily. Dx: E11.59 1 each 0  . Lancets Misc. (ACCU-CHEK FASTCLIX LANCET) KIT Use to check FSBS fasting daily. Dx: E11.59 1 kit 0  . metFORMIN (GLUCOPHAGE) 500 MG tablet TAKE 1 TABLET (500 MG TOTAL) BY MOUTH 2 (TWO) TIMES DAILY WITH A MEAL. 180 tablet 3  . metoprolol tartrate (LOPRESSOR) 25 MG tablet TAKE 1/2 TABLETS (12.5 MG TOTAL) BY MOUTH TWO TIMES DAILY. 30 tablet 0  . Multiple Vitamins-Minerals (MULTI FOR HIM PO) Take by mouth. gummies with omega 3    . OneTouch Delica Lancets 66A MISC Use to check FSBS fasting daily. Dx: E11.59 100 each 1  . RESTASIS 0.05 % ophthalmic emulsion Place 1 drop into both eyes 2 (two) times daily.    . nitroGLYCERIN (NITROSTAT) 0.4 MG SL tablet Place 1 tablet (0.4 mg total) under the tongue every 5 (five) minutes as needed for chest pain. (Patient not taking: Reported on 06/13/2019) 25 tablet 2   No current facility-administered medications for this visit.    Allergies:   Patient has no known allergies.    Social History:  The patient  reports that he has never smoked. He has never used smokeless tobacco. He reports current alcohol use. He reports previous drug use. Drug: Cocaine.   Family History:  The patient's family history includes  Breast cancer in his sister; Diabetes in his father; Heart disease in his mother; Multiple sclerosis in his sister; Stomach cancer in his sister.    ROS:  Please see the history of present illness. Otherwise, review of systems are positive for none.   All other systems are reviewed and negative.    PHYSICAL EXAM: VS:  BP 118/68   Pulse 75   Ht _0  (1.93 m)   Wt 223 lb 3.2 oz (101.2 kg)   BMI 27.17 kg/m  , BMI Body mass index is 27.17 kg/m.   General: Well developed, well nourished, NAD Neck: Negative for carotid bruits. No JVD Lungs:Clear to ausculation bilaterally. Breathing is unlabored. Cardiovascular: RRR with S1 S2. No murmurs Extremities: No edema.  Neuro: Alert and oriented. No focal deficits. No facial asymmetry. MAE spontaneously. Psych: Responds to questions appropriately with normal affect.    EKG:  EKG is ordered today. The ekg ordered today demonstrates NSR with PVC, HR 75bpm and no change when  compared to prior tracing    Recent Labs: No results found for requested labs within last 8760 hours.    Lipid Panel    Component Value Date/Time   CHOL 105 03/04/2019 0859   TRIG 77 03/04/2019 0859   HDL 41 03/04/2019 0859   CHOLHDL 2.6 03/04/2019 0859   LDLCALC 48 03/04/2019 0859    Wt Readings from Last 3 Encounters:  08/02/20 223 lb 3.2 oz (101.2 kg)  04/15/19 200 lb 6.4 oz (90.9 kg)  02/18/19 201 lb 6.4 oz (91.4 kg)     Other studies Reviewed: Additional studies/ records that were reviewed today include:  Review of the above records demonstrates:   Left Heart Catheterization 02/05/2019:  CULPRIT LESION: Prox Cx to Mid Cx lesion is 95% stenosed. Dist Cx lesion is 60% stenosed.  A drug-eluting stent was successfully placed covering both lesions, using a STENT RESOLUTE ONYX 3.0X22.-Postdilated to 3.3 mm  Post intervention, there is a 0% residual stenosis.  --  LESION SEGMENT #2: LPAV lesion is 100% stenosed - heavy concentrated thrombus  1 passive  aspiration thrombectomy followed by Balloon angioplasty was performed using a BALLOON SAPPHIRE 2.5X15.  Post intervention, there is a 60% residual stenosis -still significant thrombus burden  LPDA lesion is 95% stenosed -unable to tell the vessel size, very difficult to wire. Plan is to treat with Aggrastat to clear up thrombus burden and improve flow.  ---------  Colon Flattery LAD to Prox LAD lesion is 60% stenosed with 1st Diag lesion is 50% stenosed. - bifurcation lesion .  Prox RCA (very small caliber, non-dominant) lesion is 95% stenosed.  ---  The left ventricular systolic function is normal. The left ventricular ejection fraction is 50-55% by visual estimate.  Dist Cx lesion is 60% stenosed.  Summary:  Severe 3 vessel disease with culprit lesion being 95% heavily thrombotic ulcerated stenosis in the proximal Circumflex with thrombotic occlusion of the proximal Left PDA, there is 60% bifurcation LAD-1stDiag stenosis (plan medical management), and 95% proximal small nondominant RCA (medical management) ? Successful DES PCI of pLCx using resolute Onyx DES 3.0 mm x 22 mm (3.3 mm) ? Aspiration Thrombectomy and PTCA only of ostial beat PDA -> suboptimal result due to significant thrombus burden  Preserved EF with inferior apical hypokinesis. Mildly elevated LVEDP  Recommendations:  Return to nursing unit for TR band removal.  We will run Aggrastat for 12 hours to improve distal flow  Plan for now is to treat based on symptomatology. Would consider LAD-1STDiag bifurcation intervention if symptoms warrant in the future.  Aggressive risk factor modification --statin, (nonselective beta-blocker given recent tox screen)  Counseling to avoid substance abuse _______________  Echocardiogram 02/06/2019: Impressions: 1. Left ventricular ejection fraction, by visual estimation, is 50 to 55%. The left ventricle has low normal function. There is borderline left ventricular hypertrophy. 2.  Basal and mid inferolateral wall and basal inferior segment are abnormal. 3. Left ventricular diastolic parameters are consistent with Grade I diastolic dysfunction (impaired relaxation). 4. The left ventricle demonstrates regional wall motion abnormalities. 5. Global right ventricle has normal systolic function.The right ventricular size is normal. No increase in right ventricular wall thickness. 6. Left atrial size was normal. 7. Right atrial size was normal. 8. Presence of pericardial fat pad. 9. The pericardial effusion is circumferential. 10. Mild mitral annular calcification. 11. The mitral valve is grossly normal. Mild mitral valve regurgitation. 12. The tricuspid valve is grossly normal. Tricuspid valve regurgitation is mild. 13. The aortic valve is tricuspid. Aortic  valve regurgitation is not visualized. No evidence of aortic valve sclerosis or stenosis. 14. The pulmonic valve was grossly normal. Pulmonic valve regurgitation is not visualized. 15. Aortic dilatation noted. 16. There is mild dilatation of the aortic root measuring 41 mm. 17. The ascending aorta is mildly dilated ~40 mm. 18. Normal pulmonary artery systolic pressure. 19. The tricuspid regurgitant velocity is 2.22 m/s, and with an assumed right atrial pressure of 3 mmHg, the estimated right ventricular systolic pressure is normal at 22.6 mmHg. 20. The inferior vena cava is normal in size with greater than 50% respiratory variability, suggesting right atrial pressure of 3 mmHg.  ASSESSMENT AND PLAN:  1. CAD with Recent NSTEMI: -NSTEMI 01/2019 with severe 3VD treated with PCI/DES to proximal circumflex lesion and aspiration thrombectomy and PTCA of ostial PDA with only suboptimal result due to significant burden. -Doing well with no recurrent angina -Reports that he has run out of his Brilinta>>>will change to Plavix>>discussed with Dr. Burt Knack.  -Continue ASA 85m daily, Lopressor 12.57mtwice daily, and Lipitor  8024maily.   2. DM2 -Last HBA1c, 8.4>>started on Metformin and has had poor follow up with PCP -Obtain HbA1c today  -Emphasized the need to follow with PCP    3. Cocaine Abuse: -Denies recurrent use today    Current medicines are reviewed at length with the patient today.  The patient does not have concerns regarding medicines.  The following changes have been made:  Stop Brilinta and start Plavix 59m37m QD   Labs/ tests ordered today include: Lipid panel, HbA1c, BMET   Orders Placed This Encounter  Procedures  . CBC  . Hemoglobin A1c  . Lipid panel  . EKG 12-Lead    Disposition:   FU with Dr. CoopBurt Knack 6 months  Signed, JillKathyrn Drown  08/02/2020 10:55 AM    [place]  [id number]: [phone]: [phone]

## 2020-08-02 ENCOUNTER — Other Ambulatory Visit: Payer: Self-pay

## 2020-08-02 ENCOUNTER — Encounter: Payer: Self-pay | Admitting: Cardiology

## 2020-08-02 ENCOUNTER — Ambulatory Visit (INDEPENDENT_AMBULATORY_CARE_PROVIDER_SITE_OTHER): Payer: Medicare Other | Admitting: Cardiology

## 2020-08-02 VITALS — BP 118/68 | HR 75 | Ht 76.0 in | Wt 223.2 lb

## 2020-08-02 DIAGNOSIS — I2510 Atherosclerotic heart disease of native coronary artery without angina pectoris: Secondary | ICD-10-CM

## 2020-08-02 DIAGNOSIS — E785 Hyperlipidemia, unspecified: Secondary | ICD-10-CM | POA: Diagnosis not present

## 2020-08-02 DIAGNOSIS — I252 Old myocardial infarction: Secondary | ICD-10-CM

## 2020-08-02 DIAGNOSIS — I214 Non-ST elevation (NSTEMI) myocardial infarction: Secondary | ICD-10-CM

## 2020-08-02 DIAGNOSIS — E118 Type 2 diabetes mellitus with unspecified complications: Secondary | ICD-10-CM | POA: Diagnosis not present

## 2020-08-02 MED ORDER — CLOPIDOGREL BISULFATE 75 MG PO TABS: 75.0000 mg | ORAL_TABLET | Freq: Every day | ORAL | 3 refills | Status: DC

## 2020-08-02 NOTE — Patient Instructions (Signed)
Medication Instructions:  Your physician has recommended you make the following change in your medication:   1. START PLAVIX 75 MG DAILY  *If you need a refill on your cardiac medications before your next appointment, please call your pharmacy*   Lab Work: TODAY: CBC, A1C, NON-FASTING LIPIDS  If you have labs (blood work) drawn today and your tests are completely normal, you will receive your results only by: Marland Kitchen MyChart Message (if you have MyChart) OR . A paper copy in the mail If you have any lab test that is abnormal or we need to change your treatment, we will call you to review the results.   Testing/Procedures: NONE   Follow-Up: At [facility], you and your health needs are our priority.  As part of our continuing mission to provide you with exceptional heart care, we have created designated Provider Care Teams.  These Care Teams include your primary Cardiologist (physician) and Advanced Practice Providers (APPs -  Physician Assistants and Nurse Practitioners) who all work together to provide you with the care you need, when you need it.  We recommend signing up for the patient portal called "MyChart".  Sign up information is provided on this After Visit Summary.  MyChart is used to connect with patients for Virtual Visits (Telemedicine).  Patients are able to view lab/test results, encounter notes, upcoming appointments, etc.  Non-urgent messages can be sent to your provider as well.   To learn more about what you can do with MyChart, go to [email or web address].    Your next appointment:   6 month(s)  The format for your next appointment:   In Person  Provider:   You may see Tonny Bollman, MD or one of the following Advanced Practice Providers on your designated Care Team:    Tereso Newcomer, PA-C  [place], [place]

## 2020-08-03 LAB — CBC
Hematocrit: 39.4 % (ref 37.5–51.0)
Hemoglobin: 12.6 g/dL — ABNORMAL LOW (ref 13.0–17.7)
MCH: 27.9 pg (ref 26.6–33.0)
MCHC: 32 g/dL (ref 31.5–35.7)
MCV: 87 fL (ref 79–97)
Platelets: 181 10*3/uL (ref 150–450)
RBC: 4.52 x10E6/uL (ref 4.14–5.80)
RDW: 11.2 % — ABNORMAL LOW (ref 11.6–15.4)
WBC: 5.2 10*3/uL (ref 3.4–10.8)

## 2020-08-03 LAB — HEMOGLOBIN A1C
Est. average glucose Bld gHb Est-mCnc: 134 mg/dL
Hgb A1c MFr Bld: 6.3 % — ABNORMAL HIGH (ref 4.8–5.6)

## 2020-08-03 LAB — LIPID PANEL
Chol/HDL Ratio: 2.9 ratio (ref 0.0–5.0)
Cholesterol, Total: 114 mg/dL (ref 100–199)
HDL: 40 mg/dL (ref >39)
LDL Chol Calc (NIH): 55 mg/dL (ref 0–99)
Triglycerides: 100 mg/dL (ref 0–149)
VLDL Cholesterol Cal: 19 mg/dL (ref 5–40)

## 2020-09-29 ENCOUNTER — Other Ambulatory Visit: Payer: Self-pay | Admitting: Cardiovascular Disease

## 2020-09-30 ENCOUNTER — Other Ambulatory Visit: Payer: Self-pay | Admitting: Cardiovascular Disease

## 2020-10-26 ENCOUNTER — Other Ambulatory Visit: Payer: Self-pay | Admitting: Cardiology

## 2020-12-29 ENCOUNTER — Other Ambulatory Visit: Payer: Self-pay | Admitting: Cardiovascular Disease

## 2021-01-24 ENCOUNTER — Other Ambulatory Visit: Payer: Self-pay | Admitting: Cardiology

## 2021-04-06 IMAGING — DX DG CHEST 2V
2 series · 2 of 2 positions shown · non-contrast
Comparison: None.

CLINICAL DATA: Chest pain

EXAM:
CHEST - 2 VIEW

[chest pa]
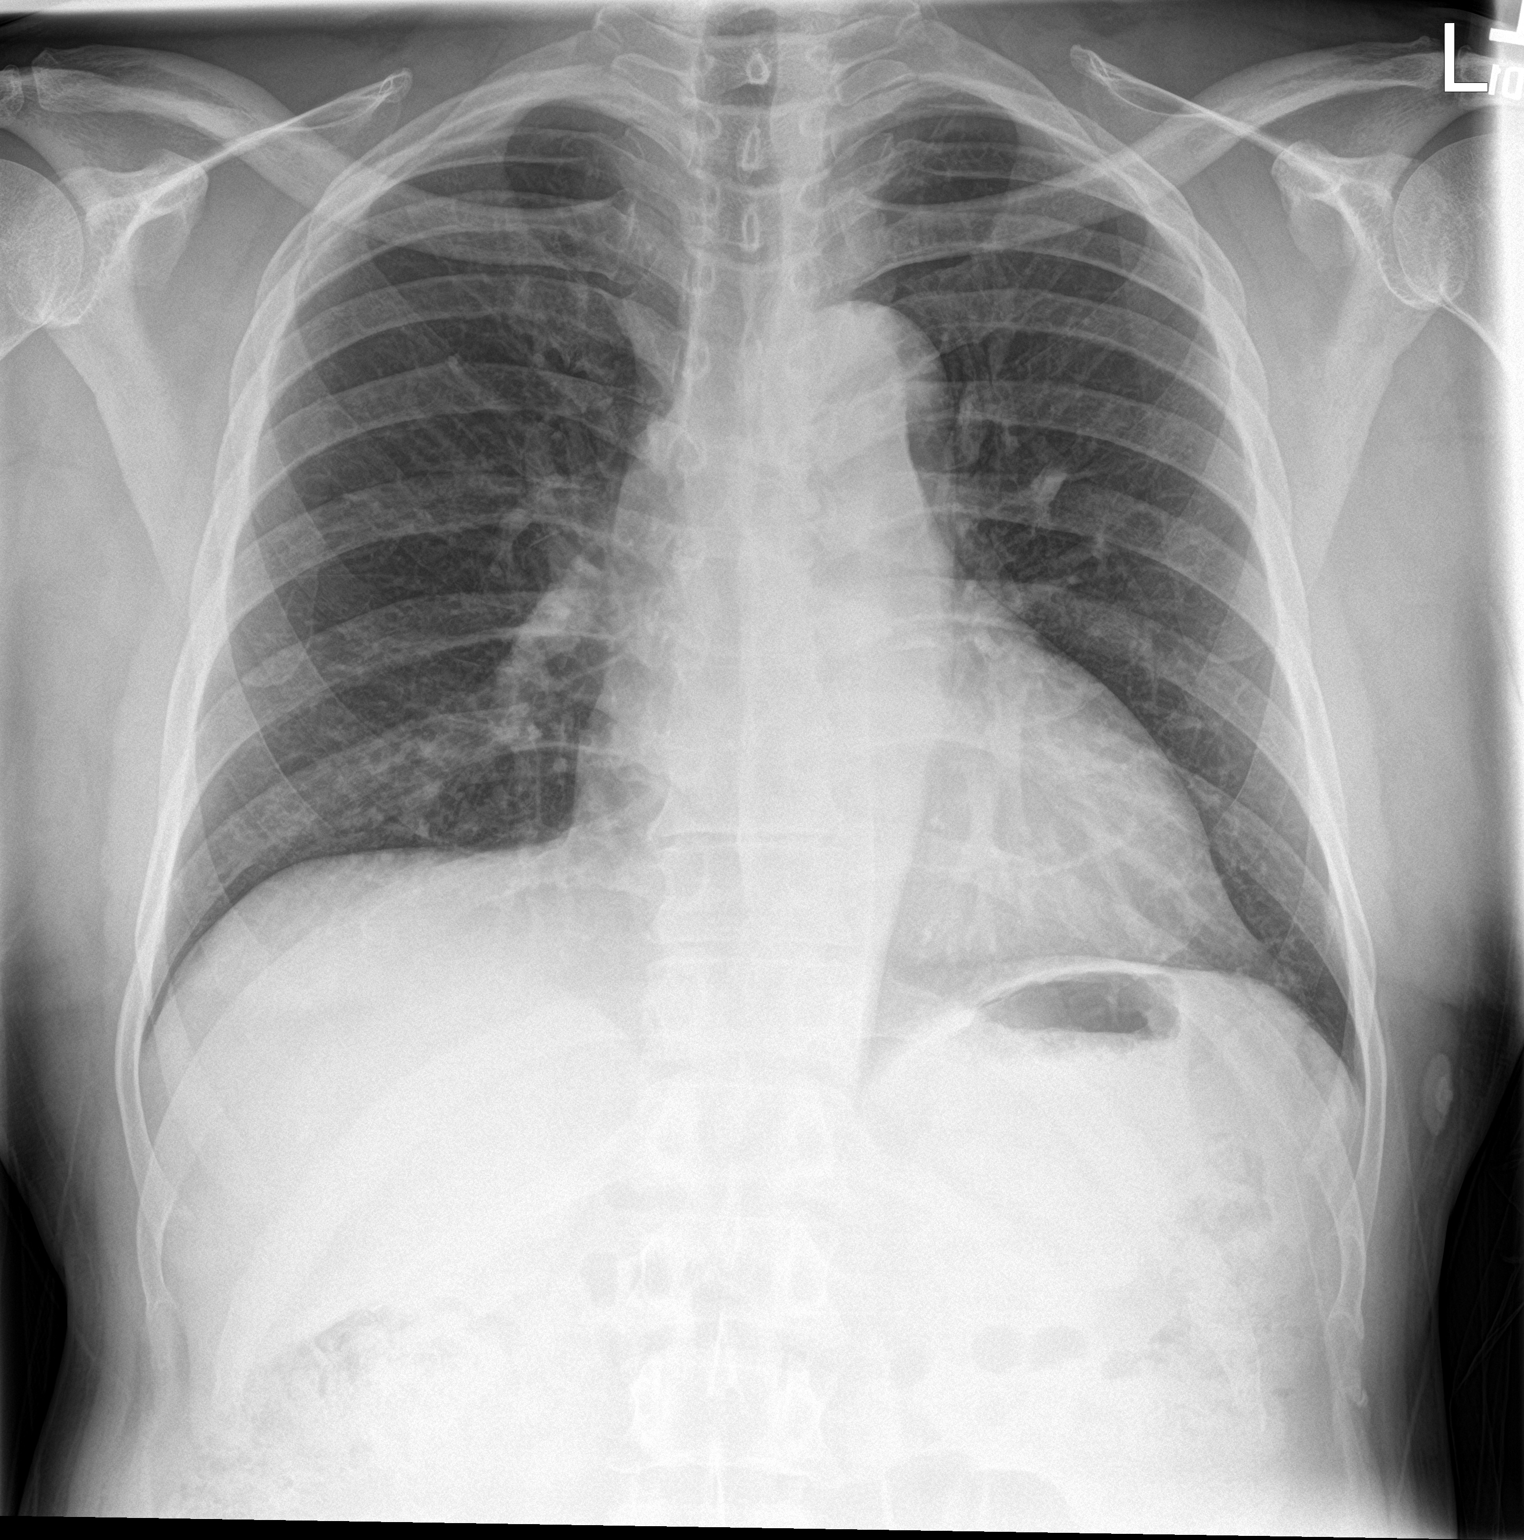

[chest lat]
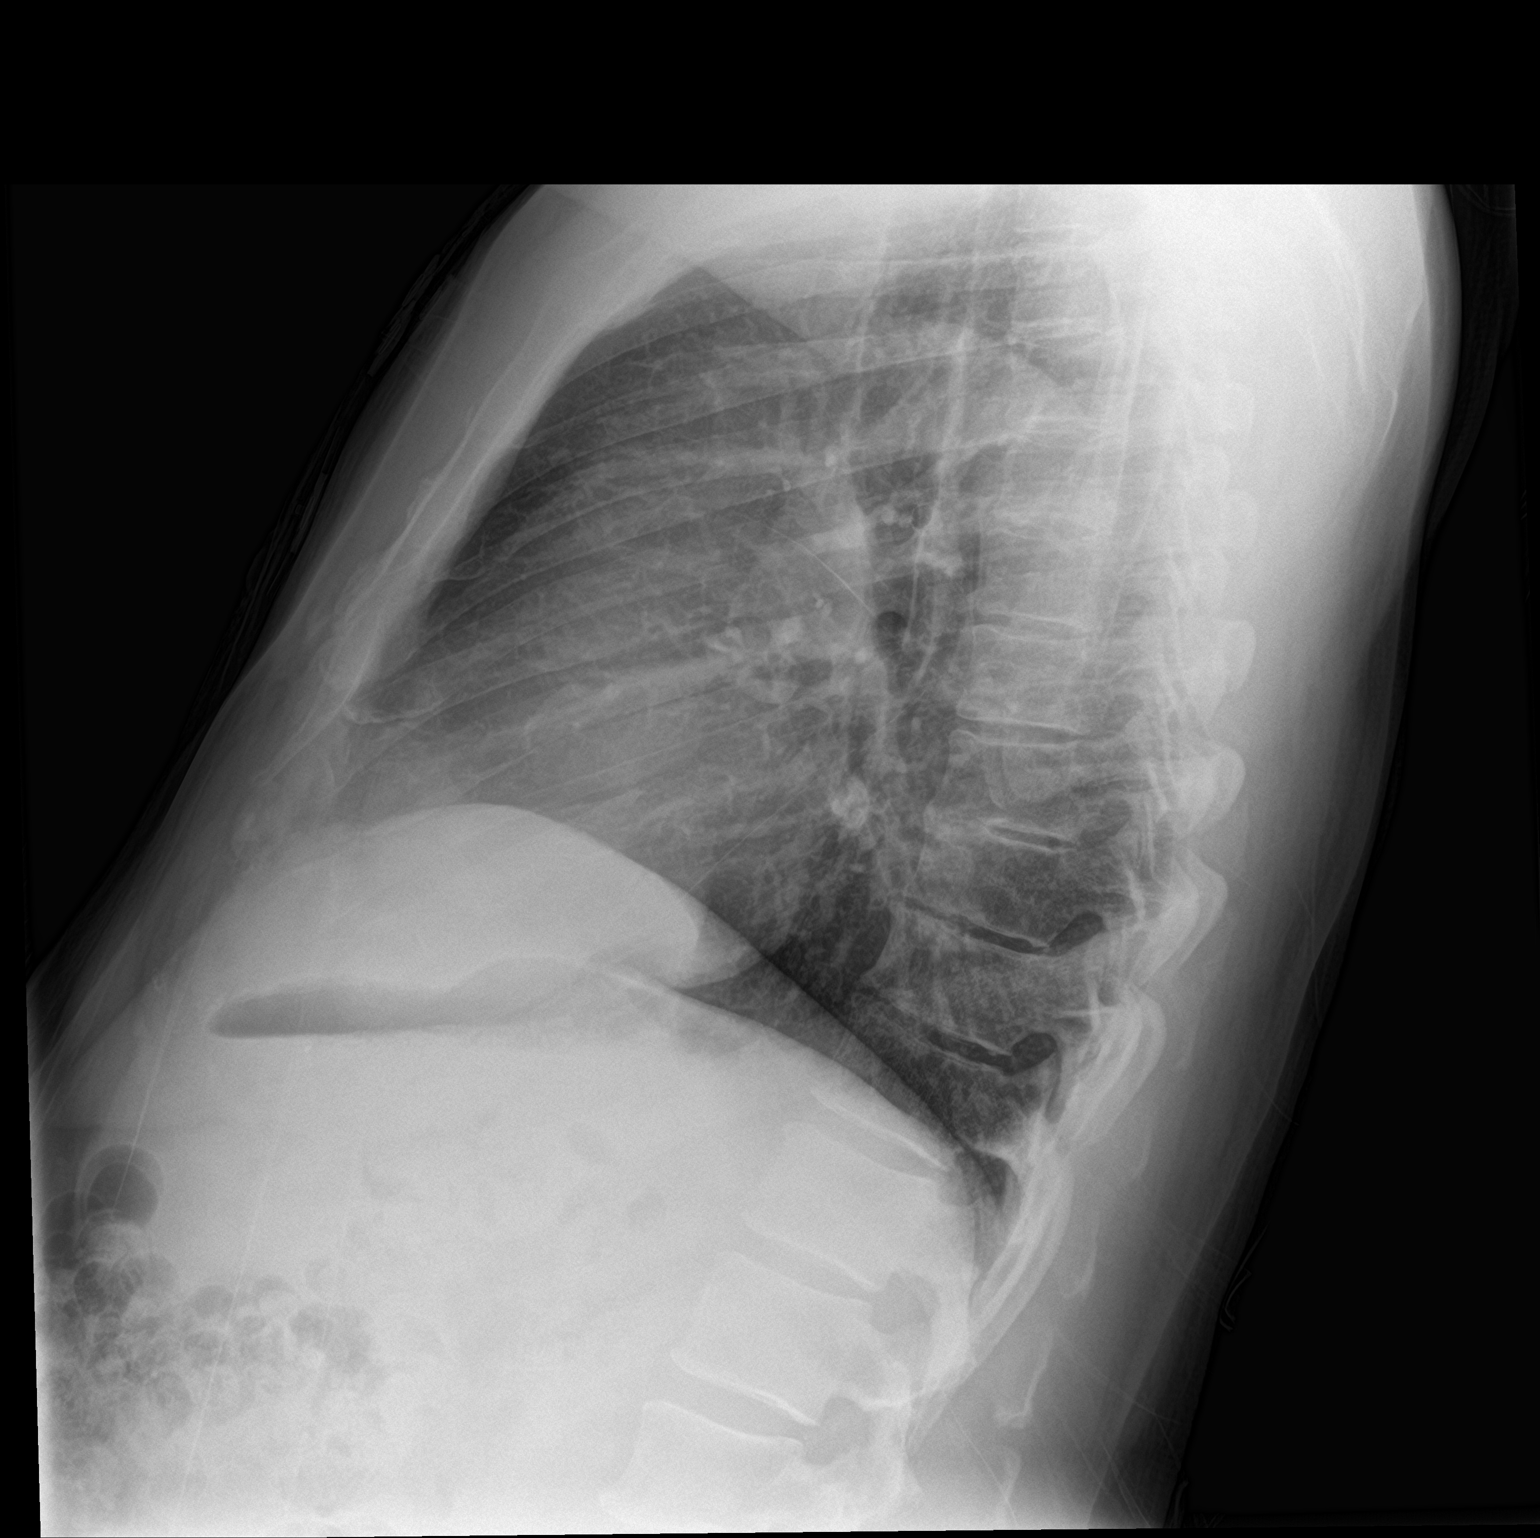

[2 of 2 positions shown; findings below may reference images not displayed]

FINDINGS: The heart size and mediastinal contours are within normal limits.
Both lungs are clear. The visualized skeletal structures are
unremarkable.
IMPRESSION: No active cardiopulmonary disease.

## 2021-04-12 NOTE — [facility] Notes (Signed)
Cardiology Office Note:    Date:  04/13/2021   ID:  Adam [name], DOB Jan 03, 1956, MRN [id number]  PCP:  Nicolette Bang, MD   [facility] Providers Cardiologist:  Sherren Mocha, MD     Referring MD: Caryl Never*   Chief Complaint: 6 mo follow-up of CAD, HTN  History of Present Illness:    Adam [name] is a 66 y.o. male with a hx of CAD with NSTEMI s/p PCI/DES, ischemic cardiomyopathy, hyperlipidemia, DM, and substance abuse.   In November 2020, he presented to the ED with sudden onset of chest pain associated with diaphoresis and exertional dyspnea. Hs troponin peaked at > 27,000. He underwent LHC that revealed severe 3 vessel disease with 95% stenosis of the proximal to mid circumflex lesion thought to be the culprit lesion, 60% stenosis distal LCx, 100% stenosis of LPAV with heavily concentrated thrombus, 60% stenosis of the ostial to proximal LAD, 50% stenosis 1st diagonal, and 95% stenosis of the pRCA (very small caliber, non-dominant). He was treated with PCI/DES of pLCx, aspiration thrombectomy and PTCA only of ostial PDA w/documented suboptimal result due to significant thrombus burden. LHC revealed inferior apical hypokinesis and mildly elevated LVEDP. Echo on the following day revealed LVEF 50-55%, borderline LVH, G1DD, basal and mid inferolateral wall and basal inferior segment abnormalities, regional wall motion abnormalities, normal RV, mild MR, mild TR.   He was last seen in our office on 08/02/20 by Kathyrn Drown, NP. He was changed from Brilinta to Plavix at that visit and advised to f/u in 6 months.   Today, he is here with his fiance'. He states he is feeling well.  Has noticed some recent right shoulder pain that is worse after sleeping on his right shoulder. He  denies chest pain, shortness of breath, fatigue, palpitations, melena, hematuria, hemoptysis, diaphoresis, weakness, presyncope, syncope, orthopnea, and PND.  Reports mild bilateral leg  edema after being on his feet all day that resolves with rest and elevation.  Denies substance abuse.  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  He denies activity intolerance or symptoms of chest pain or dyspnea with activity.   Past Medical History:  Diagnosis Date   Coronary artery disease    Diabetes mellitus without complication ([place])    Myocardial infarction ([place])    PCI/DES to pLCx, PTCA to PDA, small nondominent RCA with 95%   Occasional recreational drug use     Past Surgical History:  Procedure Laterality Date   CORONARY BALLOON ANGIOPLASTY N/A 02/05/2019   Procedure: CORONARY BALLOON ANGIOPLASTY;  Surgeon: Leonie Man, MD;  Location: [facility];  Service: Cardiovascular;  Laterality: N/A;   CORONARY STENT INTERVENTION N/A 02/05/2019   Procedure: CORONARY STENT INTERVENTION;  Surgeon: Leonie Man, MD;  Location: [place] [facility];  Service: Cardiovascular;  Laterality: N/A;   CORONARY THROMBECTOMY N/A 02/05/2019   Procedure: Coronary Thrombectomy;  Surgeon: Leonie Man, MD;  Location: [place] [facility];  Service: Cardiovascular;  Laterality: N/A;   LEFT HEART CATH AND CORONARY ANGIOGRAPHY N/A 02/05/2019   Procedure: LEFT HEART CATH AND CORONARY ANGIOGRAPHY;  Surgeon: Leonie Man, MD;  Location: [facility] [facility];  Service: Cardiovascular;  Laterality: N/A;    Current Medications: Current Meds  Medication Sig   aspirin EC 81 MG EC tablet Take 1 tablet (81 mg total) by mouth daily.   atorvastatin (LIPITOR) 40  MG tablet Take 1 tablet (40 mg total) by mouth daily at 6 PM.   Blood Glucose Monitoring Suppl (ONE TOUCH ULTRA 2) w/Device KIT Use to check FSBS fasting daily. Dx: E11.59   Blood Pressure KIT Check blood pressure once to twice daily. Notify PCP if readings greater than 160/100. ICD10 code-I10 dispense brand covered by health insurance plan   glucose blood test strip One Touch Ultra.  Use to check FSBS fasting daily. Dx: E11.59   hydrochlorothiazide (HYDRODIURIL) 25 MG tablet Take 1 tablet (25 mg total) by mouth daily.   Lancet Devices (ONE TOUCH DELICA LANCING DEV) MISC Use to check FSBS fasting daily. Dx: E11.59   Lancets Misc. (ACCU-CHEK FASTCLIX LANCET) KIT Use to check FSBS fasting daily. Dx: E11.59   metFORMIN (GLUCOPHAGE) 500 MG tablet TAKE 1 TABLET (500 MG TOTAL) BY MOUTH 2 (TWO) TIMES DAILY WITH A MEAL.   metoprolol tartrate (LOPRESSOR) 25 MG tablet Take 0.5 tablets (12.5 mg total) by mouth 2 (two) times daily. Pt needs to keep upcoming appt in Jan, 2023 for further refills   Multiple Vitamins-Minerals (MULTI FOR HIM PO) Take by mouth. gummies with omega 3   nitroGLYCERIN (NITROSTAT) 0.4 MG SL tablet Place 1 tablet (0.4 mg total) under the tongue every 5 (five) minutes as needed for chest pain.   OneTouch Delica Lancets 28U MISC Use to check FSBS fasting daily. Dx: E11.59   potassium chloride (KLOR-CON M) 10 MEQ tablet Take 1 tablet (10 mEq total) by mouth daily.   [DISCONTINUED] clopidogrel (PLAVIX) 75 MG tablet Take 1 tablet (75 mg total) by mouth daily.     Allergies:   Patient has no known allergies.   Social History   Socioeconomic History   Marital status: Single    Spouse name: Not on file   Number of children: 12   Years of education: 12   Highest education level: Not on file  Occupational History   Not on file  Tobacco Use   Smoking status: Never   Smokeless tobacco: Never  Vaping Use   Vaping Use: Never used  Substance and Sexual Activity   Alcohol use: Yes    Comment: SOCIAL   Drug use: Not Currently    Types: Cocaine   Sexual activity: Not on file  Other Topics Concern   Not on file  Social History Narrative   Not on file   Social Determinants of Health   Financial Resource Strain: Not on file  Food Insecurity: Not on file  Transportation Needs: Not on file  Physical Activity: Not on file  Stress: Not on file  Social  Connections: Not on file     Family History: The patient's family history includes Breast cancer in his sister; Diabetes in his father; Heart disease in his mother; Multiple sclerosis in his sister; Stomach cancer in his sister. There is no history of Healthy.  ROS:   Please see the history of present illness.  All other systems reviewed and are negative.  Labs/Other Studies Reviewed:    The following studies were reviewed today:   Echo 02/06/19  Left Ventricle: Left ventricular ejection fraction, by visual estimation,  is 50 to 55%. The left ventricle has low normal function. The left  ventricle demonstrates regional wall motion abnormalities. The left  ventricular internal cavity size was the left ventricle is normal in size. There is borderline left ventricular hypertrophy. Left ventricular diastolic parameters are consistent with Grade I diastolic dysfunction (impaired relaxation). Normal left atrial pressure.  LV Wall Scoring: The basal and mid inferolateral wall and basal inferior segment are hypokinetic.  Right Ventricle: The right ventricular size is normal. No increase in  right ventricular wall thickness. Global RV systolic function is has  normal systolic function. The tricuspid regurgitant velocity is 2.22 m/s,  and with an assumed right atrial pressure   of 3 mmHg, the estimated right ventricular systolic pressure is normal at  22.6 mmHg.  Left Atrium: Left atrial size was normal in size.  Right Atrium: Right atrial size was normal in size  Pericardium: There is no evidence of pericardial effusion. The pericardial  effusion is circumferential. Presence of pericardial fat pad.  Mitral Valve: The mitral valve is grossly normal. There is mild thickening  of the mitral valve leaflet(s). Mild mitral annular calcification. Mild  mitral valve regurgitation.  Tricuspid Valve: The tricuspid valve is grossly normal. Tricuspid valve  regurgitation is mild.  Aortic Valve: The  aortic valve is tricuspid. Aortic valve regurgitation is  not visualized. The aortic valve is structurally normal, with no evidence  of sclerosis or stenosis.  Pulmonic Valve: The pulmonic valve was grossly normal. Pulmonic valve  regurgitation is not visualized.  Aorta: Aortic dilatation noted. There is mild dilatation of the aortic  root measuring 41 mm. The ascending aorta is mildly dilated ~40 mm.  Venous: The inferior vena cava is normal in size with greater than 50%  respiratory variability, suggesting right atrial pressure of 3 mmHg.  IAS/Shunts: No atrial level shunt detected by color flow Doppler   LHC 02/05/19  CULPRIT LESION: Prox Cx to Mid Cx lesion is 95% stenosed. Dist Cx lesion is 60% stenosed. A drug-eluting stent was successfully placed covering both lesions, using a STENT RESOLUTE ONYX 3.0X22.-Postdilated to 3.3 mm Post intervention, there is a 0% residual stenosis. -- LESION SEGMENT #2: LPAV lesion is 100% stenosed - heavy concentrated thrombus 1 passive aspiration thrombectomy followed by Balloon angioplasty was performed using a BALLOON SAPPHIRE 2.5X15. Post intervention, there is a 60% residual stenosis -still significant thrombus burden LPDA lesion is 95% stenosed -unable to tell the vessel size, very difficult to wire. Plan is to treat with Aggrastat to clear up thrombus burden and improve flow. --------- Colon Flattery LAD to Prox LAD lesion is 60% stenosed with 1st Diag lesion is 50% stenosed. - bifurcation lesion . Prox RCA (very small caliber, non-dominant) lesion is 95% stenosed. --- The left ventricular systolic function is normal. The left ventricular ejection fraction is 50-55% by visual estimate. Dist Cx lesion is 60% stenosed.   SUMMARY Severe 3 vessel disease with culprit lesion being 95% heavily thrombotic ulcerated stenosis in the proximal Circumflex with thrombotic occlusion of the proximal Left PDA, there is 60% bifurcation LAD-1stDiag stenosis (plan medical  management), and 95% proximal small nondominant RCA (medical management) Successful DES PCI of pLCx using resolute Onyx DES 3.0 mm x 22 mm (3.3 mm) Aspiration Thrombectomy and PTCA only of ostial beat PDA -> suboptimal result due to significant thrombus burden Preserved EF with inferior apical hypokinesis.  Mildly elevated LVEDP     RECOMMENDATIONS Return to nursing unit for TR band removal. We will run Aggrastat for 12 hours to improve distal flow Plan for now is to treat based on symptomatology.  Would consider LAD-1STDiag bifurcation intervention if symptoms warrant in the future. Aggressive risk factor modification --statin, (nonselective beta-blocker given recent tox screen) Counseling to avoid substance abuse      Recent Labs: 08/02/2020: Hemoglobin 12.6; Platelets 181  Recent Lipid  Panel    Component Value Date/Time   CHOL 114 08/02/2020 1051   TRIG 100 08/02/2020 1051   HDL 40 08/02/2020 1051   CHOLHDL 2.9 08/02/2020 1051   LDLCALC 55 08/02/2020 1051     Risk Assessment/Calculations:        Physical Exam:    VS:  BP (!) 178/98 (BP Location: Right Arm)    Pulse 70    Ht '6\' 4"'  (1.93 m)    Wt 230 lb 12.8 oz (104.7 kg)    SpO2 97%    BMI 28.09 kg/m     Wt Readings from Last 3 Encounters:  04/13/21 230 lb 12.8 oz (104.7 kg)  08/02/20 223 lb 3.2 oz (101.2 kg)  04/15/19 200 lb 6.4 oz (90.9 kg)     GEN:  Well nourished, well developed in no acute distress HEENT: Normal NECK: No JVD; No carotid bruits LYMPHATICS: No lymphadenopathy CARDIAC: RRR, no murmurs, rubs, gallops RESPIRATORY:  Clear to auscultation without rales, wheezing or rhonchi  ABDOMEN: Soft, non-tender, non-distended MUSCULOSKELETAL:  No edema; No deformity. 2+ pedal pulses, equal bilaterally SKIN: Warm and dry NEUROLOGIC:  Alert and oriented x 3 PSYCHIATRIC:  Normal affect   EKG:  EKG is not ordered today.    Diagnoses:    1. Coronary artery disease involving native coronary artery of native  heart without angina pectoris   2. History of non-ST elevation myocardial infarction (NSTEMI)   3. Hyperlipidemia LDL goal <70   4. Essential hypertension   5. Chronic heart failure with preserved ejection fraction (HFpEF) (HCC)    Assessment and Plan:     CAD native without angina: He denies chest pain, dyspnea, or other symptoms concerning for angina.  Has right shoulder pain that is worse after he sleeps on his right side.  No indication for further ischemic evaluation.  Continue metoprolol, statin, aspirin, Plavix.   Essential hypertension: Blood pressure is elevated today.  His fiance reports similar readings at home.  We discussed medication options in regards to timing and side effects. We will start hydrochlorothiazide 25 mg and potassium chloride 10 mEq once daily.  Will update basic metabolic panel today and have him return in 2 weeks for a visit with our Pharm.D. in the hypertension clinic and a repeat bmet  We will schedule sooner blood work if anything is abnormal on bmet today. He admits to a high sodium diet. Encouraged low-sodium diet with no more than 2000 mg of salt per day.  Information on the Dash diet was given on AVS. 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 Continue metoprolol  Hyperlipidemia LDL goal < 70: LDL 55 08/02/20.  We will recheck today.  Continue atorvastatin.   Chronic HFpEF: LVEF 50-55%. G1DD by echo 11/20.  He denies dyspnea, PND, orthopnea, or activity intolerance. Has mild bilateral leg edema at the end of his work day. With elevated blood pressure we will start hydrochlorothiazide 25 mg daily. Continue metoprolol.   Disposition : 2 weeks with hypertension clinic; 6 months with  Dr. Burt Knack or APP     Medication Adjustments/Labs and Tests Ordered: Current medicines are reviewed at length with the patient today.  Concerns regarding medicines are outlined above.  Orders Placed This Encounter  Procedures   Comp Met  (CMET)   Direct LDL   Lipid Profile   Basic Metabolic Panel (BMET)   AMB Referral to [facility] Pharm[facility]   Meds ordered this encounter  Medications   hydrochlorothiazide (HYDRODIURIL) 25 MG tablet    Sig: Take 1 tablet (25 mg total) by mouth daily.    Dispense:  90 tablet    Refill:  3   potassium chloride (KLOR-CON M) 10 MEQ tablet    Sig: Take 1 tablet (10 mEq total) by mouth daily.    Dispense:  90 tablet    Refill:  3   clopidogrel (PLAVIX) 75 MG tablet    Sig: Take 1 tablet (75 mg total) by mouth daily.    Dispense:  90 tablet    Refill:  3    Patient Instructions  Medication Instructions:   START HCTZ one (1) tablet by mouth ( 25 mg) daily.  START K DUR one (1) tablet by mouth ( 10 mEq) daily.   *If you need a refill on your cardiac medications before your next appointment, please call your pharmacy*   Lab Work:  TODAY!!!!!  CMET/DIRECT LDL/LIPID  Your physician recommends that you return for lab work Thursday, February 2. You can come in on the day of your appointment anytime between 7:30-4:30.  Same day as pharmacist appointment.    If you have labs (blood work) drawn today and your tests are completely normal, you will receive your results only by: [place] (if you have MyChart) OR A paper copy in the mail If you have any lab test that is abnormal or we need to change your treatment, we will call you to review the results.   Testing/Procedures:  None ordered.    Follow-Up: At [facility], you and your health needs are our priority.  As part of our continuing mission to provide you with exceptional heart care, we have created designated Provider Care Teams.  These Care Teams include your primary Cardiologist (physician) and Advanced Practice Providers (APPs -  Physician Assistants and Nurse Practitioners) who all work together to provide you with the care you need, when you need it.  We recommend signing up for the patient portal called "MyChart".   Sign up information is provided on this After Visit Summary.  MyChart is used to connect with patients for Virtual Visits (Telemedicine).  Patients are able to view lab/test results, encounter notes, upcoming appointments, etc.  Non-urgent messages can be sent to your provider as well.   To learn more about what you can do with MyChart, go to [email or web address].    Your next appointment:   6 month(s)  The format for your next appointment:   In Person  Provider:   Christen Bame, NP         Other Instructions   Please check blood pressure at home if blood pressure is consistently above 140/80 call @ [phone] or send mychart messag.   Blood Pressure Record Sheet To take your blood pressure, you will need a blood pressure machine. You can buy a blood pressure machine (blood pressure monitor) at your clinic, drug store, or online. When choosing one, consider: An automatic monitor that has an arm cuff. A cuff that wraps snugly around your upper arm. You should be able to fit only one finger between your arm and the cuff. A device that stores blood pressure reading results. Do not choose a monitor that measures your blood pressure from your wrist or finger. Follow your health care provider's instructions for how to take your blood pressure. To use this form: Get one reading in the morning (a.m.) before you take any medicines. Get one reading in the evening (p.m.)  before supper. Take at least 2 readings with each blood pressure check. This makes sure the results are correct. Wait 1-2 minutes between measurements. Write down the results in the spaces on this form. Repeat this once a week, or as told by your health care provider. Make a follow-up appointment with your health care provider to discuss the results. Blood pressure log Date: _______________________ a.m. _____________________(1st reading) _____________________(2nd reading) p.m. _____________________(1st reading)  _____________________(2nd reading) Date: _______________________ a.m. _____________________(1st reading) _____________________(2nd reading) p.m. _____________________(1st reading) _____________________(2nd reading) Date: _______________________ a.m. _____________________(1st reading) _____________________(2nd reading) p.m. _____________________(1st reading) _____________________(2nd reading) Date: _______________________ a.m. _____________________(1st reading) _____________________(2nd reading) p.m. _____________________(1st reading) _____________________(2nd reading) Date: _______________________ a.m. _____________________(1st reading) _____________________(2nd reading) p.m. _____________________(1st reading) _____________________(2nd reading) This information is not intended to replace advice given to you by your health care provider. Make sure you discuss any questions you have with your health care provider. Document Revised: 07/01/2019 Document Reviewed: 07/02/2019 Elsevier Patient Education  2022 [place] Eating Plan DASH stands for Dietary Approaches to Stop Hypertension. The DASH eating plan is a healthy eating plan that has been shown to: Reduce high blood pressure (hypertension). Reduce your risk for type 2 diabetes, heart disease, and stroke. Help with weight loss. What are tips for following this plan? Reading food labels Check food labels for the amount of salt (sodium) per serving. Choose foods with less than 5 percent of the Daily Value of sodium. Generally, foods with less than 300 milligrams (mg) of sodium per serving fit into this eating plan. To find whole grains, look for the word "whole" as the first word in the ingredient list. Shopping Buy products labeled as "low-sodium" or "no salt added." Buy fresh foods. Avoid canned foods and pre-made or frozen meals. Cooking Avoid adding salt when cooking. Use salt-free seasonings or herbs instead of table salt  or sea salt. Check with your health care provider or pharmacist before using salt substitutes. Do not fry foods. Cook foods using healthy methods such as baking, boiling, grilling, roasting, and broiling instead. Cook with heart-healthy oils, such as olive, canola, avocado, soybean, or sunflower oil. Meal planning  Eat a balanced diet that includes: 4 or more servings of fruits and 4 or more servings of vegetables each day. Try to fill one-half of your plate with fruits and vegetables. 6-8 servings of whole grains each day. Less than 6 oz (170 g) of lean meat, poultry, or fish each day. A 3-oz (85-g) serving of meat is about the same size as a deck of cards. One egg equals 1 oz (28 g). 2-3 servings of low-fat dairy each day. One serving is 1 cup (237 mL). 1 serving of nuts, seeds, or beans 5 times each week. 2-3 servings of heart-healthy fats. Healthy fats called omega-3 fatty acids are found in foods such as walnuts, flaxseeds, fortified milks, and eggs. These fats are also found in cold-water fish, such as sardines, salmon, and mackerel. Limit how much you eat of: Canned or prepackaged foods. Food that is high in trans fat, such as some fried foods. Food that is high in saturated fat, such as fatty meat. Desserts and other sweets, sugary drinks, and other foods with added sugar. Full-fat dairy products. Do not salt foods before eating. Do not eat more than 4 egg yolks a week. Try to eat at least 2 vegetarian meals a week. Eat more home-cooked food and less restaurant, buffet, and fast food. Lifestyle When eating at a restaurant, ask that your food be  prepared with less salt or no salt, if possible. If you drink alcohol: Limit how much you use to: 0-1 drink a day for women who are not pregnant. 0-2 drinks a day for men. Be aware of how much alcohol is in your drink. In the [place] one drink equals one 12 oz bottle of beer (355 mL), one 5 oz glass of wine (148 mL), or one 1 oz glass of  hard liquor (44 mL). General information Avoid eating more than 2,300 mg of salt a day. If you have hypertension, you may need to reduce your sodium intake to 1,500 mg a day. Work with your health care provider to maintain a healthy body weight or to lose weight. Ask what an ideal weight is for you. Get at least 30 minutes of exercise that causes your heart to beat faster (aerobic exercise) most days of the week. Activities may include walking, swimming, or biking. Work with your health care provider or dietitian to adjust your eating plan to your individual calorie needs. What foods should I eat? Fruits All fresh, dried, or frozen fruit. Canned fruit in natural juice (without added sugar). Vegetables Fresh or frozen vegetables (raw, steamed, roasted, or grilled). Low-sodium or reduced-sodium tomato and vegetable juice. Low-sodium or reduced-sodium tomato sauce and tomato paste. Low-sodium or reduced-sodium canned vegetables. Grains Whole-grain or whole-wheat bread. Whole-grain or whole-wheat pasta. Brown rice. [place]. Bulgur. Whole-grain and low-sodium cereals. Pita bread. Low-fat, low-sodium crackers. Whole-wheat flour tortillas. Meats and other proteins Skinless chicken or [place]. Ground chicken or [place]. Pork with fat trimmed off. Fish and seafood. Egg whites. Dried beans, peas, or lentils. Unsalted nuts, nut butters, and seeds. Unsalted canned beans. Lean cuts of beef with fat trimmed off. Low-sodium, lean precooked or cured meat, such as sausages or meat loaves. Dairy Low-fat (1%) or fat-free (skim) milk. Reduced-fat, low-fat, or fat-free cheeses. Nonfat, low-sodium ricotta or cottage cheese. Low-fat or nonfat yogurt. Low-fat, low-sodium cheese. Fats and oils Soft margarine without trans fats. Vegetable oil. Reduced-fat, low-fat, or light mayonnaise and salad dressings (reduced-sodium). Canola, safflower, olive, avocado, soybean, and sunflower oils. Avocado. Seasonings and  condiments Herbs. Spices. Seasoning mixes without salt. Other foods Unsalted popcorn and pretzels. Fat-free sweets. The items listed above may not be a complete list of foods and beverages you can eat. Contact a dietitian for more information. What foods should I avoid? Fruits Canned fruit in a light or heavy syrup. Fried fruit. Fruit in cream or butter sauce. Vegetables Creamed or fried vegetables. Vegetables in a cheese sauce. Regular canned vegetables (not low-sodium or reduced-sodium). Regular canned tomato sauce and paste (not low-sodium or reduced-sodium). Regular tomato and vegetable juice (not low-sodium or reduced-sodium). Angie Fava. Olives. Grains Baked goods made with fat, such as croissants, muffins, or some breads. Dry pasta or rice meal packs. Meats and other proteins Fatty cuts of meat. Ribs. Fried meat. Berniece Salines. Bologna, salami, and other precooked or cured meats, such as sausages or meat loaves. Fat from the back of a pig (fatback). Bratwurst. Salted nuts and seeds. Canned beans with added salt. Canned or smoked fish. Whole eggs or egg yolks. Chicken or [place] with skin. Dairy Whole or 2% milk, cream, and half-and-half. Whole or full-fat cream cheese. Whole-fat or sweetened yogurt. Full-fat cheese. Nondairy creamers. Whipped toppings. Processed cheese and cheese spreads. Fats and oils Butter. Stick margarine. Lard. Shortening. Ghee. Bacon fat. Tropical oils, such as coconut, palm kernel, or palm oil. Seasonings and condiments Onion salt, garlic salt, seasoned salt, table salt,  and sea salt. Worcestershire sauce. Tartar sauce. Barbecue sauce. Teriyaki sauce. Soy sauce, including reduced-sodium. Steak sauce. Canned and packaged gravies. Fish sauce. Oyster sauce. Cocktail sauce. Store-bought horseradish. Ketchup. Mustard. Meat flavorings and tenderizers. Bouillon cubes. Hot sauces. Pre-made or packaged marinades. Pre-made or packaged taco seasonings. Relishes. Regular salad  dressings. Other foods Salted popcorn and pretzels. The items listed above may not be a complete list of foods and beverages you should avoid. Contact a dietitian for more information. Where to find more information National Heart, Lung, and Blood Institute: https://wilson-eaton.com/ American Heart Association: www.heart.org Academy of Nutrition and Dietetics: www.eatright.[place]: www.kidney.org Summary The DASH eating plan is a healthy eating plan that has been shown to reduce high blood pressure (hypertension). It may also reduce your risk for type 2 diabetes, heart disease, and stroke. When on the DASH eating plan, aim to eat more fresh fruits and vegetables, whole grains, lean proteins, low-fat dairy, and heart-healthy fats. With the DASH eating plan, you should limit salt (sodium) intake to 2,300 mg a day. If you have hypertension, you may need to reduce your sodium intake to 1,500 mg a day. Work with your health care provider or dietitian to adjust your eating plan to your individual calorie needs. This information is not intended to replace advice given to you by your health care provider. Make sure you discuss any questions you have with your health care provider. Document Revised: 02/14/2019 Document Reviewed: 02/14/2019 Elsevier Patient Education  2022 [place], Emmaline Life, NP  04/13/2021 4:33 PM    [facility]

## 2021-04-13 ENCOUNTER — Other Ambulatory Visit: Payer: Self-pay

## 2021-04-13 ENCOUNTER — Ambulatory Visit (INDEPENDENT_AMBULATORY_CARE_PROVIDER_SITE_OTHER): Payer: BC Managed Care – PPO | Admitting: Nurse Practitioner

## 2021-04-13 ENCOUNTER — Encounter: Payer: Self-pay | Admitting: Nurse Practitioner

## 2021-04-13 VITALS — BP 178/98 | HR 70 | Ht 76.0 in | Wt 230.8 lb

## 2021-04-13 DIAGNOSIS — I252 Old myocardial infarction: Secondary | ICD-10-CM

## 2021-04-13 DIAGNOSIS — I10 Essential (primary) hypertension: Secondary | ICD-10-CM

## 2021-04-13 DIAGNOSIS — E785 Hyperlipidemia, unspecified: Secondary | ICD-10-CM | POA: Diagnosis not present

## 2021-04-13 DIAGNOSIS — I2510 Atherosclerotic heart disease of native coronary artery without angina pectoris: Secondary | ICD-10-CM | POA: Diagnosis not present

## 2021-04-13 DIAGNOSIS — I5032 Chronic diastolic (congestive) heart failure: Secondary | ICD-10-CM

## 2021-04-13 MED ORDER — POTASSIUM CHLORIDE CRYS ER 10 MEQ PO TBCR: 10.0000 meq | EXTENDED_RELEASE_TABLET | Freq: Every day | ORAL | 3 refills | Status: DC

## 2021-04-13 MED ORDER — HYDROCHLOROTHIAZIDE 25 MG PO TABS: 25.0000 mg | ORAL_TABLET | Freq: Every day | ORAL | 3 refills | Status: DC

## 2021-04-13 MED ORDER — CLOPIDOGREL BISULFATE 75 MG PO TABS: 75.0000 mg | ORAL_TABLET | Freq: Every day | ORAL | 3 refills | Status: DC

## 2021-04-13 NOTE — Patient Instructions (Addendum)
Medication Instructions:   START HCTZ one (1) tablet by mouth ( 25 mg) daily.  START K DUR one (1) tablet by mouth ( 10 mEq) daily.   *If you need a refill on your cardiac medications before your next appointment, please call your pharmacy*   Lab Work:  TODAY!!!!!  CMET/DIRECT LDL/LIPID  Your physician recommends that you return for lab work Thursday, February 2. You can come in on the day of your appointment anytime between 7:30-4:30.  Same day as pharmacist appointment.    If you have labs (blood work) drawn today and your tests are completely normal, you will receive your results only by: MyChart Message (if you have MyChart) OR A paper copy in the mail If you have any lab test that is abnormal or we need to change your treatment, we will call you to review the results.   Testing/Procedures:  None ordered.    Follow-Up: At [facility], you and your health needs are our priority.  As part of our continuing mission to provide you with exceptional heart care, we have created designated Provider Care Teams.  These Care Teams include your primary Cardiologist (physician) and Advanced Practice Providers (APPs -  Physician Assistants and Nurse Practitioners) who all work together to provide you with the care you need, when you need it.  We recommend signing up for the patient portal called "MyChart".  Sign up information is provided on this After Visit Summary.  MyChart is used to connect with patients for Virtual Visits (Telemedicine).  Patients are able to view lab/test results, encounter notes, upcoming appointments, etc.  Non-urgent messages can be sent to your provider as well.   To learn more about what you can do with MyChart, go to [email or web address].    Your next appointment:   6 month(s)  The format for your next appointment:   In Person  Provider:   Eligha Bridegroom, NP         Other Instructions   Please check blood pressure at home if blood pressure is  consistently above 140/80 call @ [phone] or send mychart messag.   Blood Pressure Record Sheet To take your blood pressure, you will need a blood pressure machine. You can buy a blood pressure machine (blood pressure monitor) at your clinic, drug store, or online. When choosing one, consider: An automatic monitor that has an arm cuff. A cuff that wraps snugly around your upper arm. You should be able to fit only one finger between your arm and the cuff. A device that stores blood pressure reading results. Do not choose a monitor that measures your blood pressure from your wrist or finger. Follow your health care provider's instructions for how to take your blood pressure. To use this form: Get one reading in the morning (a.m.) before you take any medicines. Get one reading in the evening (p.m.) before supper. Take at least 2 readings with each blood pressure check. This makes sure the results are correct. Wait 1-2 minutes between measurements. Write down the results in the spaces on this form. Repeat this once a week, or as told by your health care provider. Make a follow-up appointment with your health care provider to discuss the results. Blood pressure log Date: _______________________ a.m. _____________________(1st reading) _____________________(2nd reading) p.m. _____________________(1st reading) _____________________(2nd reading) Date: _______________________ a.m. _____________________(1st reading) _____________________(2nd reading) p.m. _____________________(1st reading) _____________________(2nd reading) Date: _______________________ a.m. _____________________(1st reading) _____________________(2nd reading) p.m. _____________________(1st reading) _____________________(2nd reading) Date: _______________________ a.m. _____________________(1st reading) _____________________(2nd reading) p.m. _____________________(1st  reading) _____________________(2nd reading) Date:  _______________________ a.m. _____________________(1st reading) _____________________(2nd reading) p.m. _____________________(1st reading) _____________________(2nd reading) This information is not intended to replace advice given to you by your health care provider. Make sure you discuss any questions you have with your health care provider. Document Revised: 07/01/2019 Document Reviewed: 07/02/2019 Elsevier Patient Education  2022 Elsevier Inc.  DASH Eating Plan DASH stands for Dietary Approaches to Stop Hypertension. The DASH eating plan is a healthy eating plan that has been shown to: Reduce high blood pressure (hypertension). Reduce your risk for type 2 diabetes, heart disease, and stroke. Help with weight loss. What are tips for following this plan? Reading food labels Check food labels for the amount of salt (sodium) per serving. Choose foods with less than 5 percent of the Daily Value of sodium. Generally, foods with less than 300 milligrams (mg) of sodium per serving fit into this eating plan. To find whole grains, look for the word "whole" as the first word in the ingredient list. Shopping Buy products labeled as "low-sodium" or "no salt added." Buy fresh foods. Avoid canned foods and pre-made or frozen meals. Cooking Avoid adding salt when cooking. Use salt-free seasonings or herbs instead of table salt or sea salt. Check with your health care provider or pharmacist before using salt substitutes. Do not fry foods. Cook foods using healthy methods such as baking, boiling, grilling, roasting, and broiling instead. Cook with heart-healthy oils, such as olive, canola, avocado, soybean, or sunflower oil. Meal planning  Eat a balanced diet that includes: 4 or more servings of fruits and 4 or more servings of vegetables each day. Try to fill one-half of your plate with fruits and vegetables. 6-8 servings of whole grains each day. Less than 6 oz (170 g) of lean meat, poultry, or fish  each day. A 3-oz (85-g) serving of meat is about the same size as a deck of cards. One egg equals 1 oz (28 g). 2-3 servings of low-fat dairy each day. One serving is 1 cup (237 mL). 1 serving of nuts, seeds, or beans 5 times each week. 2-3 servings of heart-healthy fats. Healthy fats called omega-3 fatty acids are found in foods such as walnuts, flaxseeds, fortified milks, and eggs. These fats are also found in cold-water fish, such as sardines, salmon, and mackerel. Limit how much you eat of: Canned or prepackaged foods. Food that is high in trans fat, such as some fried foods. Food that is high in saturated fat, such as fatty meat. Desserts and other sweets, sugary drinks, and other foods with added sugar. Full-fat dairy products. Do not salt foods before eating. Do not eat more than 4 egg yolks a week. Try to eat at least 2 vegetarian meals a week. Eat more home-cooked food and less restaurant, buffet, and fast food. Lifestyle When eating at a restaurant, ask that your food be prepared with less salt or no salt, if possible. If you drink alcohol: Limit how much you use to: 0-1 drink a day for women who are not pregnant. 0-2 drinks a day for men. Be aware of how much alcohol is in your drink. In the [place] one drink equals one 12 oz bottle of beer (355 mL), one 5 oz glass of wine (148 mL), or one 1 oz glass of hard liquor (44 mL). General information Avoid eating more than 2,300 mg of salt a day. If you have hypertension, you may need to reduce your sodium intake to 1,500 mg a day. Work with  your health care provider to maintain a healthy body weight or to lose weight. Ask what an ideal weight is for you. Get at least 30 minutes of exercise that causes your heart to beat faster (aerobic exercise) most days of the week. Activities may include walking, swimming, or biking. Work with your health care provider or dietitian to adjust your eating plan to your individual calorie needs. What  foods should I eat? Fruits All fresh, dried, or frozen fruit. Canned fruit in natural juice (without added sugar). Vegetables Fresh or frozen vegetables (raw, steamed, roasted, or grilled). Low-sodium or reduced-sodium tomato and vegetable juice. Low-sodium or reduced-sodium tomato sauce and tomato paste. Low-sodium or reduced-sodium canned vegetables. Grains Whole-grain or whole-wheat bread. Whole-grain or whole-wheat pasta. Brown rice. [place] Cobbatmeal. Quinoa. Bulgur. Whole-grain and low-sodium cereals. Pita bread. Low-fat, low-sodium crackers. Whole-wheat flour tortillas. Meats and other proteins Skinless chicken or [place]. Ground chicken or [place]. Pork with fat trimmed off. Fish and seafood. Egg whites. Dried beans, peas, or lentils. Unsalted nuts, nut butters, and seeds. Unsalted canned beans. Lean cuts of beef with fat trimmed off. Low-sodium, lean precooked or cured meat, such as sausages or meat loaves. Dairy Low-fat (1%) or fat-free (skim) milk. Reduced-fat, low-fat, or fat-free cheeses. Nonfat, low-sodium ricotta or cottage cheese. Low-fat or nonfat yogurt. Low-fat, low-sodium cheese. Fats and oils Soft margarine without trans fats. Vegetable oil. Reduced-fat, low-fat, or light mayonnaise and salad dressings (reduced-sodium). Canola, safflower, olive, avocado, soybean, and sunflower oils. Avocado. Seasonings and condiments Herbs. Spices. Seasoning mixes without salt. Other foods Unsalted popcorn and pretzels. Fat-free sweets. The items listed above may not be a complete list of foods and beverages you can eat. Contact a dietitian for more information. What foods should I avoid? Fruits Canned fruit in a light or heavy syrup. Fried fruit. Fruit in cream or butter sauce. Vegetables Creamed or fried vegetables. Vegetables in a cheese sauce. Regular canned vegetables (not low-sodium or reduced-sodium). Regular canned tomato sauce and paste (not low-sodium or reduced-sodium). Regular tomato and  vegetable juice (not low-sodium or reduced-sodium). Rosita FirePickles. Olives. Grains Baked goods made with fat, such as croissants, muffins, or some breads. Dry pasta or rice meal packs. Meats and other proteins Fatty cuts of meat. Ribs. Fried meat. Tomasa BlaseBacon. Bologna, salami, and other precooked or cured meats, such as sausages or meat loaves. Fat from the back of a pig (fatback). Bratwurst. Salted nuts and seeds. Canned beans with added salt. Canned or smoked fish. Whole eggs or egg yolks. Chicken or [place] with skin. Dairy Whole or 2% milk, cream, and half-and-half. Whole or full-fat cream cheese. Whole-fat or sweetened yogurt. Full-fat cheese. Nondairy creamers. Whipped toppings. Processed cheese and cheese spreads. Fats and oils Butter. Stick margarine. Lard. Shortening. Ghee. Bacon fat. Tropical oils, such as coconut, palm kernel, or palm oil. Seasonings and condiments Onion salt, garlic salt, seasoned salt, table salt, and sea salt. Worcestershire sauce. Tartar sauce. Barbecue sauce. Teriyaki sauce. Soy sauce, including reduced-sodium. Steak sauce. Canned and packaged gravies. Fish sauce. Oyster sauce. Cocktail sauce. Store-bought horseradish. Ketchup. Mustard. Meat flavorings and tenderizers. Bouillon cubes. Hot sauces. Pre-made or packaged marinades. Pre-made or packaged taco seasonings. Relishes. Regular salad dressings. Other foods Salted popcorn and pretzels. The items listed above may not be a complete list of foods and beverages you should avoid. Contact a dietitian for more information. Where to find more information National Heart, Lung, and Blood Institute: PopSteam.iswww.nhlbi.nih.gov American Heart Association: www.heart.org Academy of Nutrition and Dietetics: www.eatright.org National Kidney Foundation: www.kidney.org Summary  The DASH eating plan is a healthy eating plan that has been shown to reduce high blood pressure (hypertension). It may also reduce your risk for type 2 diabetes, heart disease,  and stroke. When on the DASH eating plan, aim to eat more fresh fruits and vegetables, whole grains, lean proteins, low-fat dairy, and heart-healthy fats. With the DASH eating plan, you should limit salt (sodium) intake to 2,300 mg a day. If you have hypertension, you may need to reduce your sodium intake to 1,500 mg a day. Work with your health care provider or dietitian to adjust your eating plan to your individual calorie needs. This information is not intended to replace advice given to you by your health care provider. Make sure you discuss any questions you have with your health care provider. Document Revised: 02/14/2019 Document Reviewed: 02/14/2019 Elsevier Patient Education  2022 [employer]

## 2021-04-14 ENCOUNTER — Other Ambulatory Visit: Payer: Self-pay | Admitting: *Deleted

## 2021-04-14 DIAGNOSIS — I5032 Chronic diastolic (congestive) heart failure: Secondary | ICD-10-CM

## 2021-04-14 DIAGNOSIS — E118 Type 2 diabetes mellitus with unspecified complications: Secondary | ICD-10-CM

## 2021-04-14 DIAGNOSIS — I10 Essential (primary) hypertension: Secondary | ICD-10-CM

## 2021-04-14 LAB — COMPREHENSIVE METABOLIC PANEL
ALT: 19 IU/L (ref 0–44)
AST: 12 IU/L (ref 0–40)
Albumin/Globulin Ratio: 2.2 (ref 1.2–2.2)
Albumin: 4.9 g/dL — ABNORMAL HIGH (ref 3.8–4.8)
Alkaline Phosphatase: 93 IU/L (ref 44–121)
BUN/Creatinine Ratio: 14 (ref 10–24)
BUN: 17 mg/dL (ref 8–27)
Bilirubin Total: 1.3 mg/dL — ABNORMAL HIGH (ref 0.0–1.2)
CO2: 23 mmol/L (ref 20–29)
Calcium: 10.2 mg/dL (ref 8.6–10.2)
Chloride: 105 mmol/L (ref 96–106)
Creatinine, Ser: 1.21 mg/dL (ref 0.76–1.27)
Globulin, Total: 2.2 g/dL (ref 1.5–4.5)
Glucose: 179 mg/dL — ABNORMAL HIGH (ref 70–99)
Potassium: 4.4 mmol/L (ref 3.5–5.2)
Sodium: 142 mmol/L (ref 134–144)
Total Protein: 7.1 g/dL (ref 6.0–8.5)
eGFR: 66 mL/min/{1.73_m2} (ref >59)

## 2021-04-14 LAB — LIPID PANEL
Chol/HDL Ratio: 2.4 ratio (ref 0.0–5.0)
Cholesterol, Total: 122 mg/dL (ref 100–199)
HDL: 51 mg/dL (ref >39)
LDL Chol Calc (NIH): 57 mg/dL (ref 0–99)
Triglycerides: 69 mg/dL (ref 0–149)
VLDL Cholesterol Cal: 14 mg/dL (ref 5–40)

## 2021-04-14 LAB — LDL CHOLESTEROL, DIRECT: LDL Direct: 60 mg/dL (ref 0–99)

## 2021-04-19 ENCOUNTER — Other Ambulatory Visit: Payer: Self-pay | Admitting: Cardiovascular Disease

## 2021-04-21 ENCOUNTER — Other Ambulatory Visit: Payer: BC Managed Care – PPO

## 2021-04-21 ENCOUNTER — Other Ambulatory Visit: Payer: Self-pay

## 2021-04-21 DIAGNOSIS — I10 Essential (primary) hypertension: Secondary | ICD-10-CM

## 2021-04-21 DIAGNOSIS — I2510 Atherosclerotic heart disease of native coronary artery without angina pectoris: Secondary | ICD-10-CM

## 2021-04-21 DIAGNOSIS — E785 Hyperlipidemia, unspecified: Secondary | ICD-10-CM | POA: Diagnosis not present

## 2021-04-21 DIAGNOSIS — I252 Old myocardial infarction: Secondary | ICD-10-CM

## 2021-04-22 LAB — BASIC METABOLIC PANEL
BUN/Creatinine Ratio: 19 (ref 10–24)
BUN: 26 mg/dL (ref 8–27)
CO2: 20 mmol/L (ref 20–29)
Calcium: 10.4 mg/dL — ABNORMAL HIGH (ref 8.6–10.2)
Chloride: 101 mmol/L (ref 96–106)
Creatinine, Ser: 1.38 mg/dL — ABNORMAL HIGH (ref 0.76–1.27)
Glucose: 192 mg/dL — ABNORMAL HIGH (ref 70–99)
Potassium: 4.5 mmol/L (ref 3.5–5.2)
Sodium: 142 mmol/L (ref 134–144)
eGFR: 56 mL/min/{1.73_m2} — ABNORMAL LOW (ref >59)

## 2021-04-25 ENCOUNTER — Other Ambulatory Visit: Payer: Self-pay | Admitting: *Deleted

## 2021-04-25 MED ORDER — HYDROCHLOROTHIAZIDE 25 MG PO TABS
12.5000 mg | ORAL_TABLET | Freq: Every day | ORAL | 3 refills | Status: DC
Start: 2021-04-25 — End: 2021-08-17

## 2021-04-25 MED ORDER — POTASSIUM CHLORIDE CRYS ER 10 MEQ PO TBCR: 5.0000 meq | EXTENDED_RELEASE_TABLET | Freq: Every day | ORAL | 3 refills | Status: DC

## 2021-04-27 ENCOUNTER — Other Ambulatory Visit: Payer: Self-pay | Admitting: Internal Medicine

## 2021-04-27 DIAGNOSIS — E1159 Type 2 diabetes mellitus with other circulatory complications: Secondary | ICD-10-CM

## 2021-04-28 ENCOUNTER — Ambulatory Visit (INDEPENDENT_AMBULATORY_CARE_PROVIDER_SITE_OTHER): Payer: BC Managed Care – PPO | Admitting: Family Medicine

## 2021-04-28 ENCOUNTER — Ambulatory Visit (INDEPENDENT_AMBULATORY_CARE_PROVIDER_SITE_OTHER): Payer: BC Managed Care – PPO | Admitting: Pharmacist

## 2021-04-28 ENCOUNTER — Other Ambulatory Visit: Payer: Self-pay

## 2021-04-28 ENCOUNTER — Other Ambulatory Visit: Payer: BC Managed Care – PPO

## 2021-04-28 VITALS — BP 128/82 | HR 71

## 2021-04-28 VITALS — BP 147/85 | HR 75 | Temp 97.5°F | Resp 16 | Ht 76.0 in | Wt 228.0 lb

## 2021-04-28 DIAGNOSIS — Z23 Encounter for immunization: Secondary | ICD-10-CM | POA: Diagnosis not present

## 2021-04-28 DIAGNOSIS — I10 Essential (primary) hypertension: Secondary | ICD-10-CM

## 2021-04-28 DIAGNOSIS — E7849 Other hyperlipidemia: Secondary | ICD-10-CM

## 2021-04-28 DIAGNOSIS — E1159 Type 2 diabetes mellitus with other circulatory complications: Secondary | ICD-10-CM | POA: Diagnosis not present

## 2021-04-28 LAB — POCT GLYCOSYLATED HEMOGLOBIN (HGB A1C): Hemoglobin A1C: 8.1 % — AB (ref 4.0–5.6)

## 2021-04-28 MED ORDER — METFORMIN HCL 1000 MG PO TABS: 1000.0000 mg | ORAL_TABLET | Freq: Two times a day (BID) | ORAL | 0 refills | Status: DC

## 2021-04-28 MED ORDER — GLIPIZIDE ER 10 MG PO TB24: 10.0000 mg | ORAL_TABLET | Freq: Every day | ORAL | 0 refills | Status: DC

## 2021-04-28 NOTE — Patient Instructions (Addendum)
Please continue checking blood pressure 1-2 times a day. You can skip checking blood pressure first thing in the morning. Continue HCTZ 25mg  daily and metoprolol tartrate 12.5mg  twice a day. I will call you tomorrow with your lab results Call me at [phone] with any questions  Hypertension "High blood pressure"  Hypertension is often called The Silent Killer. It rarely causes symptoms until it is extremely  high or has done damage to other organs in the body. For this reason, you should have your  blood pressure checked regularly by your physician. We will check your blood pressure  every time you see a provider at one of our offices.   Your blood pressure reading consists of two numbers. Ideally, blood pressure should be  below 120/80. The first (top) number is called the systolic pressure. It measures the  pressure in your arteries as your heart beats. The second (bottom) number is called the diastolic pressure. It measures the pressure in your arteries as the heart relaxes between beats.  The benefits of getting your blood pressure under control are enormous. A 10-point  reduction in systolic blood pressure can reduce your risk of stroke by 27% and heart failure by 28%  Your blood pressure goal is <130/80  To check your pressure at home you will need to:  1. Sit up in a chair, with feet flat on the floor and back supported. Do not cross your ankles or legs. 2. Rest your left arm so that the cuff is about heart level. If the cuff goes on your upper arm,  then just relax the arm on the table, arm of the chair or your lap. If you have a wrist cuff, we  suggest relaxing your wrist against your chest (think of it as Pledging the Flag with the  wrong arm).  3. Place the cuff snugly around your arm, about 1 inch above the crook of your elbow. The  cords should be inside the groove of your elbow.  4. Sit quietly, with the cuff in place, for about 5 minutes. After that 5 minutes  press the power  button to start a reading. 5. Do not talk or move while the reading is taking place.  6. Record your readings on a sheet of paper. Although most cuffs have a memory, it is often  easier to see a pattern developing when the numbers are all in front of you.  7. You can repeat the reading after 1-3 minutes if it is recommended  Make sure your bladder is empty and you have not had caffeine or tobacco within the last 30 min  Always bring your blood pressure log with you to your appointments. If you have not brought your monitor in to be double checked for accuracy, please bring it to your next appointment.  You can find a list of validated (accurate) blood pressure cuffs at [phone]  Healthy Diet  SALT  What is the big deal with sodium? Why the need to limit our intake? What is the connection to  blood pressure? And what is the difference between salt and sodium? Sodium attracts water. Think about it. When you eat an overly salty snack, you tend to become  thirsty and need more water. If you have too much sodium in your bloodstream, your body will  then pull water into the bloodstream as well, trying to correct the imbalance. When you have  more volume in the bloodstream, your blood pressure goes up. Your heart must work  harder to  pump the extra volume, and the increase in pressure can wear out blood vessels faster. You may  also notice bloating and weight gain. Hypertension is one of the leading risk factors for heart  disease. By limiting sodium intake throughout life you are helping decrease your risk of heart  disease later on.   Salt is made up of two minerals. Sodium and chloride. A teaspoon of salt contains about 40%  sodium and 60% chloride. One teaspoon of salt has 2,300 mg of sodium. While the current  USDA guideline states you should consume no more than 2,300 mg per day, both they and the  American Heart Association recommend that you limit this to 1,500  mg to stay healthy. Sea salt  or Himalayan pink salt may have a slightly different taste, but they still have almost the same  percent of sodium per teaspoon. So feel free to use them instead of table salt, but dont use  more.  A common myth is that if you dont add salt to your food, you are following a low sodium diet.  However, 75% of the sodium consumed in the American diet is from processed foods, NOT the  salt shaker. We all know that chips and crackers are high in sodium, but there are many other  foods that we may not think of when limiting our sodium. Below are the salty six foods that  the American Heart Association wants you to be aware of. 1. Cold cuts - even the healthy sliced [place] can have over 1,000 mg of sodium per slice.   Compare different brands to see which has less sodium if you eat these regularly 2. Pizza - depending on your toppings, a slice of pizza can have up to 760 mg of   sodium. Put more veggies on it or just have a slice with a side salad and still enjoy. 3. Soup - yes even that old home remedy of chicken soup is loaded with sodium. Look   for low sodium versions. Or add a bunch of frozen veggies when heating it, this will   give you less sodium per serving 4. Breads - they may not taste salty, but a single slice of bread can have up to 230 mg of   sodium. Toast for breakfast, a sandwich at lunch and a dinner roll can quickly add up   to over 900 mg in just one day.  5. Chicken - some fresh or frozen chicken is injected with a sodium solution before it   reaches the store. A 4 oz serving should have no more than 100 mg sodium. And   watch for breaded frozen chicken nuggets, strips and tenders. They may seem like a   quick and easy healthy meal, but they have high amounts of sodium as well. 6. Burritos/tacos - just 2 teaspoons of taco seasoning can have over 400 mg sodium.   Try making your own with equal parts of cumin, oregano, chili powder and garlic  powder.   SUGAR  Sugar is a huge problem in the modern day diet. Sugar is a HUGE contributor to heart disease, diabetes, high triglyceride levels, fatty liver diease and obesity. Sugar is hidden in almost all packaged foods/beverages. It adds no nutritional benefit to your body and can cause major harm. Added sugar is extra sugar that is added beyond what is naturally found. The American Heart Association recommends limiting added sugars to no more than 25g for women and  36 grams for men per day.  There are many names for sugar maltose, sucrose (names ending in "ose"), high fructose corn syrup, molasses, cane sugar, corn sweetener, raw sugar, syrup, honey or fruit juice concentrate.   One of the best ways to limit your added sugars is to stop drinking sweetened beverages such as soda, sweet tea, fruit juice or fancy coffee's. There is 65g of added sugars in one 20oz bottle of Coke!! That is equal to 6 donuts.   Pay attention and read all nutrition facts labels. Below is an examples of a nutrition facts label. The #1 is showing you the total sugars where the # 2 is showing you the added sugars. This one severing has almost the max amount of added sugars per day!  Watch out for items that say "low fat" or "no added sugar" as these products are typically very high in sugar. The food industry uses these terms to fool you into thinking they are healthy.  For more information on the dangers of sugar watch WHY Sugar is as Bad as Alcohol (Fructose, The Liver Toxin) on YouTube.    EXERCISE  Exercise can help lower your blood pressure ~5 points systolic (top #) and 8 points diastolic (bottom #)  Exercise is good. Weve all heard that. In an ideal world, we would all have time and resources to  get plenty of it. When you are active your heart pumps more efficiently and you will feel better.  Multiple studies show that even walking regularly has benefits that include living a longer life.  The American  Heart Association recommends 90-150 minutes per week of exercise (30 minutes  per day most days of the week). You can do this in any increment you wish. Nine or more  10-minute walks count. So does an hour-long exercise class. Break the time apart into what will  work in your life. Some of the best things you can do include walking briskly, jogging, cycling or  swimming laps. Not everyone is ready to exercise. Sometimes we need to start with just getting active. Here  are some easy ways to be more active throughout the day:  Take the stairs instead of the elevator  Go for a 10-15 minute walk during your lunch break (find a friend to make it more enjoyable)  When shopping, park at the back of the parking lot  If you take public transportation, get off one stop early and walk the extra distance  Pace around while making phone calls (most of us are not attached to phone cords any longer!) Check with your doctor if you arent sure what your limitations may be. Always remember to drink plenty of water when doing any type of exercise. Dont feel like a failure if youre not getting the 90-150 minutes per week. If you started by being  a couch potato, then just a 10-minute walk each day is a huge improvement. Start with little  victories and work your way up.   Healthy Eating Tips  When looking to improve your eating habits, whether to lose weight, lower blood pressure or just be healthier, it helps to know what a serving size is.   Grains 1 slice of bread,  bagel,  cup pasta or rice  Vegetables 1 cup fresh or raw vegetables,  cup cooked or canned Fruits 1 piece of medium sized fruit,  cup canned,   Meats/Proteins  cup dried       1 oz meat, 1 egg,  cup cooked beans, nuts or seeds  Dairy        Fats Individual yogurt container, 1 cup (8oz)    1 teaspoon margarine/butter or vegetable  milk or milk alternative, 1 slice of cheese          oil; 1 tablespoon mayonnaise or salad dressing                   Plan ahead: make a menu of the meals for a week then create a grocery list to go with  that menu. Consider meals that easily stretch into a night of leftovers, such as stews or  casseroles. Or consider making two of your favorite meal and put one in the freezer or fridge for  another night.  When you get home from the grocery store wash and prepare your vegetables and fruits.  Then when you need them they are ready to go.  Tips for going to the grocery store:  Buy store or generic brands  Check the weekly ad from your store on-line or in their in-store flyer  Look at the unit price on the shelf tag to compare/contrast the costs of different items  Buy fruits/vegetables in season  Carrots, bananas and apples are low-cost, naturally healthy items  If meats or frozen vegetables are on sale, buy some extras and put in your freezer  Limit buying prepared or ready to eat items, even if they are pre-made salads or fruit snacks  Do not shop when youre hungry  Foods at eye level tend to be more expensive. Look on the high and low shelves for deals.  Consider shopping at the farmers market for fresh foods in season.  Choose canned tuna or salmon instead of fresh  Avoid the cookie and chip aisles (these are expensive, high in calories and low in  nutritional value) Healthy food preparations:  If you cant get lean hamburger, be sure to drain the fat when cooking  Steam, saut (in olive oil), grill or bake foods  Experiment with different seasonings to avoid adding salt to your foods. Kosher salt, sea salt and Himalayan salt are all still salt and should be avoided          Aim to have one 12 hour fast each day. This means no eating after dinner until breakfast. For example, if you eat dinner around 6 PM then you would not eat anything until 6 AM the next day. This is a great way to help lower your insulin levels, lose weight and reduce your blood  pressure.  Resources: American Heart Association - MartiniMobile.it Go to the Healthy Living tab to get more information American Diabetes Association - www.diabetes.org You dont have to be diabetic - check out the Food and Fitness tab  DASH diet - PopSteam.is Health topics - or just search on their home page for DASH Quit for Life - www.cancer.org Follow the Stay Healthy tab to learn more about smoking cessatio

## 2021-04-28 NOTE — Progress Notes (Signed)
Patient is here for CPE  Patient has no new concerns for provider today

## 2021-04-28 NOTE — Progress Notes (Signed)
Patient ID: Adam [name]                 DOB: May 09, 1955                      MRN: [id number] ,  /   HPI: Adam [name] is a 66 y.o. male patient of Dr. Antionette Char referred by Christen Bame, NP to HTN clinic. PMH is significant for NSTEMI s/p PCI/DES, ischemic cardiomyopathy, hyperlipidemia, DM, and substance abuse. Patient was seen by Sharyn Lull 04/13/21. BP was elevated at 178/98. He was started on HCTZ 43m daily and KCL 10 MEQ. Repeat BMP showed a slight increase in Scr. HCTZ was decreased to 12.546mdaily.  Patient presents today to HTN clinic accompanied by his fiance. Fiance helps with medications and checking his blood pressure. Per patient, he just got the message about decreasing HCTZ. He has been taking a whole tablet. Denies dizziness, lightheadedness, headache, blurred vision, SOB or swelling No NSAID use or decongestant use. Does eat deli meats/hot dogs.   Blood pressures are home are variable. Checking BP in bed first thing in the AM, then again about 30 min after medications. Then checking twice again in the evening. Not resting prior.  Current HTN meds: HCTZ 2529maily, metoprolol tartrate 12.5mg37mice a day Previously tried:  BP goal: <130/80  Family History: The patient's family history includes Breast cancer in his sister; Diabetes in his father; Heart disease in his mother; Multiple sclerosis in his sister; Stomach cancer in his sister  Social History:  Social History   Socioeconomic History   Marital status: Single    Spouse name: Not on file   Number of children: 12  74ears of education: 12   Highest education level: Not on file  Occupational History   Not on file  Tobacco Use   Smoking status: Never   Smokeless tobacco: Never  Vaping Use   Vaping Use: Never used  Substance and Sexual Activity   Alcohol use: Yes    Comment: SOCIAL   Drug use: Not Currently    Types: Cocaine   Sexual activity: Not on file  Other Topics Concern   Not on file  Social  History Narrative   Not on file   Social Determinants of Health   Financial Resource Strain: Not on file  Food Insecurity: Not on file  Transportation Needs: Not on file  Physical Activity: Not on file  Stress: Not on file  Social Connections: Not on file  Intimate Partner Violence: Not on file    Diet: 1 8 oz cup coffee per day, 1 soda per day, does not salt food Hot dog once a week   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  Home BP readings: 145/77, 136/84, 126/77, 145/80, 159/86, 131/77, 162/90, 121/77, 151/94, 155/95, 136/80, 135/92, 156/93, 138/86, 123/78, 153/84, 139/87, 134/96, 144/86, 162/89, 133/88, 133/82, 157/92, 143/87, 148/87, 136/78, 144/82  Wt Readings from Last 3 Encounters:  04/13/21 230 lb 12.8 oz (104.7 kg)  08/02/20 223 lb 3.2 oz (101.2 kg)  04/15/19 200 lb 6.4 oz (90.9 kg)   BP Readings from Last 3 Encounters:  04/13/21 (!) 178/98  08/02/20 118/68  06/18/19 110/66   Pulse Readings from Last 3 Encounters:  04/13/21 70  08/02/20 75  06/18/19 63    Renal function: CrCl cannot be calculated (Unknown ideal weight.).  Past Medical History:  Diagnosis Date   Coronary artery disease    Diabetes  mellitus without complication ([place])    Myocardial infarction ([place])    PCI/DES to pLCx, PTCA to PDA, small nondominent RCA with 95%   Occasional recreational drug use     Current Outpatient Medications on File Prior to Visit  Medication Sig Dispense Refill   aspirin EC 81 MG EC tablet Take 1 tablet (81 mg total) by mouth daily. 90 tablet 3   atorvastatin (LIPITOR) 40 MG tablet Take 1 tablet (40 mg total) by mouth daily at 6 PM. 90 tablet 2   Blood Glucose Monitoring Suppl (ONE TOUCH ULTRA 2) w/Device KIT Use to check FSBS fasting daily. Dx: E11.59 1 kit 0   Blood Pressure KIT Check blood pressure once to twice daily. Notify PCP if readings greater than 160/100. ICD10 code-I10 dispense brand covered by health insurance plan 1 kit 0   clopidogrel  (PLAVIX) 75 MG tablet Take 1 tablet (75 mg total) by mouth daily. 90 tablet 3   glucose blood test strip One Touch Ultra. Use to check FSBS fasting daily. Dx: E11.59 100 each 1   hydrochlorothiazide (HYDRODIURIL) 25 MG tablet Take 0.5 tablets (12.5 mg total) by mouth daily. 90 tablet 3   Lancet Devices (ONE TOUCH DELICA LANCING DEV) MISC Use to check FSBS fasting daily. Dx: E11.59 1 each 0   Lancets Misc. (ACCU-CHEK FASTCLIX LANCET) KIT Use to check FSBS fasting daily. Dx: E11.59 1 kit 0   metFORMIN (GLUCOPHAGE) 500 MG tablet TAKE 1 TABLET (500 MG TOTAL) BY MOUTH 2 (TWO) TIMES DAILY WITH A MEAL. 180 tablet 3   metoprolol tartrate (LOPRESSOR) 25 MG tablet Take 0.5 tablets (12.5 mg total) by mouth 2 (two) times daily. Pt needs to keep upcoming appt in Jan, 2023 for further refills 90 tablet 2   Multiple Vitamins-Minerals (MULTI FOR HIM PO) Take by mouth. gummies with omega 3     nitroGLYCERIN (NITROSTAT) 0.4 MG SL tablet PLACE 1 TABLET UNDER THE TONUGE EVERY 5 MINUTES AS NEEDED FOR CHEST PAIN 25 tablet 1   OneTouch Delica Lancets 57M MISC Use to check FSBS fasting daily. Dx: E11.59 100 each 1   potassium chloride (KLOR-CON M) 10 MEQ tablet Take 0.5 tablets (5 mEq total) by mouth daily. 45 tablet 3   No current facility-administered medications on file prior to visit.    No Known Allergies   Assessment/Plan:  1. Hypertension - Blood pressure above goal of <130/80 in clinic, but pretty close to goal. I have reviewed proper technique with patient and his fiance. His scr bump was not >30%. PCP rechecked BMP today. If it stays stable, then I think its ok to continue HCTZ 67m daily. I have asked patient to keep checking his BP, after resting 5 or more min. Skip reading first thing in the AM. Follow up in 3 weeks (next available). Follow up on labs tomorrow.   MRamond Dial Pharm.D, BCPS, CPP [place] [address] [place] [address] Phone: [phone]  Fax: [phone]

## 2021-04-29 LAB — BASIC METABOLIC PANEL
BUN/Creatinine Ratio: 17 (ref 10–24)
BUN: 22 mg/dL (ref 8–27)
CO2: 24 mmol/L (ref 20–29)
Calcium: 10.3 mg/dL — ABNORMAL HIGH (ref 8.6–10.2)
Chloride: 98 mmol/L (ref 96–106)
Creatinine, Ser: 1.31 mg/dL — ABNORMAL HIGH (ref 0.76–1.27)
Glucose: 336 mg/dL — ABNORMAL HIGH (ref 70–99)
Potassium: 4.8 mmol/L (ref 3.5–5.2)
Sodium: 137 mmol/L (ref 134–144)
eGFR: 60 mL/min/{1.73_m2} (ref >59)

## 2021-04-29 LAB — MICROALBUMIN / CREATININE URINE RATIO
Creatinine, Urine: 84.7 mg/dL
Microalb/Creat Ratio: 22 mg/g creat (ref 0–29)
Microalbumin, Urine: 18.5 ug/mL

## 2021-04-29 LAB — LIPID PANEL
Chol/HDL Ratio: 3.1 ratio (ref 0.0–5.0)
Cholesterol, Total: 127 mg/dL (ref 100–199)
HDL: 41 mg/dL (ref >39)
LDL Chol Calc (NIH): 59 mg/dL (ref 0–99)
Triglycerides: 158 mg/dL — ABNORMAL HIGH (ref 0–149)
VLDL Cholesterol Cal: 27 mg/dL (ref 5–40)

## 2021-04-29 LAB — AST: AST: 17 IU/L (ref 0–40)

## 2021-04-29 LAB — ALT: ALT: 24 IU/L (ref 0–44)

## 2021-04-29 MED ORDER — ONETOUCH ULTRA 2 W/DEVICE KIT: PACK | 0 refills | Status: AC

## 2021-05-02 ENCOUNTER — Encounter: Payer: Self-pay | Admitting: Family Medicine

## 2021-05-02 NOTE — Progress Notes (Signed)
New Patient Office Visit  Subjective:  Patient ID: Adam [name], male    DOB: November 15, 1955  Age: 66 y.o. MRN: [id number]  CC:  Chief Complaint  Patient presents with   Annual Exam    HPI Adam [name] presents for routine follow up of chronic med issues. Patient denies acute complaints or concerns.   Past Medical History:  Diagnosis Date   Coronary artery disease    Diabetes mellitus without complication ([place])    Myocardial infarction ([place])    PCI/DES to pLCx, PTCA to PDA, small nondominent RCA with 95%   Occasional recreational drug use     Past Surgical History:  Procedure Laterality Date   CORONARY BALLOON ANGIOPLASTY N/A 02/05/2019   Procedure: CORONARY BALLOON ANGIOPLASTY;  Surgeon: Leonie Man, MD;  Location: [facility];  Service: Cardiovascular;  Laterality: N/A;   CORONARY STENT INTERVENTION N/A 02/05/2019   Procedure: CORONARY STENT INTERVENTION;  Surgeon: Leonie Man, MD;  Location: [place] [facility];  Service: Cardiovascular;  Laterality: N/A;   CORONARY THROMBECTOMY N/A 02/05/2019   Procedure: Coronary Thrombectomy;  Surgeon: Leonie Man, MD;  Location: [facility];  Service: Cardiovascular;  Laterality: N/A;   LEFT HEART CATH AND CORONARY ANGIOGRAPHY N/A 02/05/2019   Procedure: LEFT HEART CATH AND CORONARY ANGIOGRAPHY;  Surgeon: Leonie Man, MD;  Location: [place] [facility];  Service: Cardiovascular;  Laterality: N/A;    Family History  Problem Relation Age of Onset   Heart disease Mother    Diabetes Father    Breast cancer Sister    Multiple sclerosis Sister    Stomach cancer Sister    Healthy Neg Hx     Social History   Socioeconomic History   Marital status: Single    Spouse name: Not on file   Number of children: 12   Years of education: 12   Highest education level: Not on file  Occupational History   Not on file  Tobacco Use   Smoking status: Never   Smokeless tobacco: Never  Vaping Use   Vaping  Use: Never used  Substance and Sexual Activity   Alcohol use: Yes    Comment: SOCIAL   Drug use: Not Currently    Types: Cocaine   Sexual activity: Not on file  Other Topics Concern   Not on file  Social History Narrative   Not on file   Social Determinants of Health   Financial Resource Strain: Not on file  Food Insecurity: Not on file  Transportation Needs: Not on file  Physical Activity: Not on file  Stress: Not on file  Social Connections: Not on file  Intimate Partner Violence: Not on file    ROS Review of Systems  All other systems reviewed and are negative.  Objective:   Today's Vitals: BP (!) 147/85    Pulse 75    Temp (!) 97.5 F (36.4 C) (Oral)    Resp 16    Ht '6\' 4"'  (1.93 m)    Wt 228 lb (103.4 kg)    SpO2 95%    BMI 27.75 kg/m   Physical Exam Vitals and nursing note reviewed.  Constitutional:      General: He is not in acute distress. Cardiovascular:     Rate and Rhythm: Normal rate and regular rhythm.  Pulmonary:     Effort: Pulmonary effort is normal.     Breath sounds: Normal breath sounds.  Abdominal:     Palpations:  Abdomen is soft.     Tenderness: There is no abdominal tenderness.  Musculoskeletal:     Right lower leg: No edema.     Left lower leg: No edema.  Neurological:     General: No focal deficit present.     Mental Status: He is alert and oriented to person, place, and time.    Assessment & Plan:   1. Type 2 diabetes mellitus with other circulatory complication, without long-term current use of insulin (HCC) Elevated A1c and not near goal. Monitoring labs ordered. Increased metformin from 570m to 10066mbid and added glucotrol xl 10 mg daily to regimen. [facility] - POCT glycosylated hemoglobin (Hb A1C) - Microalbumin / creatinine urine ratio - Ambulatory referral to Podiatry - metFORMIN (GLUCOPHAGE) 1000 MG tablet; Take 1 tablet (1,000 mg total) by mouth 2 (two) times daily with a  meal.  Dispense: 180 tablet; Refill: 0 - glipiZIDE (GLUCOTROL XL) 10 MG 24 hr tablet; Take 1 tablet (10 mg total) by mouth daily with breakfast.  Dispense: 90 tablet; Refill: 0 - Basic Metabolic Panel - AST - ALT - Lipid Panel - Blood Glucose Monitoring Suppl (ONE TOUCH ULTRA 2) w/Device KIT; Use to check FSBS fasting daily. Dx: E11.59  Dispense: 1 kit; Refill: 0  2. Essential hypertension Slightly elevated reading. Monitoring labs ordered. Continue present management . - Basic Metabolic Panel - AST - ALT - Lipid Panel  3. Other hyperlipidemia Monitoring labs ordered. Continue present management - Basic Metabolic Panel - AST - ALT - Lipid Panel  4. Need for immunization against influenza  - Flu Vaccine QUAD High Dose(Fluad)    Outpatient Encounter Medications as of 04/28/2021  Medication Sig   aspirin EC 81 MG EC tablet Take 1 tablet (81 mg total) by mouth daily.   atorvastatin (LIPITOR) 40 MG tablet Take 1 tablet (40 mg total) by mouth daily at 6 PM.   Blood Pressure KIT Check blood pressure once to twice daily. Notify PCP if readings greater than 160/100. ICD10 code-I10 dispense brand covered by health insurance plan   clopidogrel (PLAVIX) 75 MG tablet Take 1 tablet (75 mg total) by mouth daily.   glipiZIDE (GLUCOTROL XL) 10 MG 24 hr tablet Take 1 tablet (10 mg total) by mouth daily with breakfast.   glucose blood test strip One Touch Ultra. Use to check FSBS fasting daily. Dx: E11.59   hydrochlorothiazide (HYDRODIURIL) 25 MG tablet Take 0.5 tablets (12.5 mg total) by mouth daily. (Patient taking differently: Take 25 mg by mouth daily.)   Lancet Devices (ONE TOUCH DELICA LANCING DEV) MISC Use to check FSBS fasting daily. Dx: E11.59   Lancets Misc. (ACCU-CHEK FASTCLIX LANCET) KIT Use to check FSBS fasting daily. Dx: E11.59   metFORMIN (GLUCOPHAGE) 1000 MG tablet Take 1 tablet (1,000 mg total) by mouth 2 (two) times daily with a meal.   metoprolol tartrate (LOPRESSOR) 25 MG  tablet Take 0.5 tablets (12.5 mg total) by mouth 2 (two) times daily. Pt needs to keep upcoming appt in Jan, 2023 for further refills   Multiple Vitamins-Minerals (MULTI FOR HIM PO) Take by mouth. gummies with omega 3   nitroGLYCERIN (NITROSTAT) 0.4 MG SL tablet PLACE 1 TABLET UNDER THE TONUGE EVERY 5 MINUTES AS NEEDED FOR CHEST PAIN   OneTouch Delica Lancets 3383AISC Use to check FSBS fasting daily. Dx: E11.59   potassium chloride (KLOR-CON M) 10 MEQ tablet Take 0.5 tablets (5 mEq total) by mouth daily.   [  DISCONTINUED] Blood Glucose Monitoring Suppl (ONE TOUCH ULTRA 2) w/Device KIT Use to check FSBS fasting daily. Dx: E11.59   [DISCONTINUED] metFORMIN (GLUCOPHAGE) 500 MG tablet TAKE 1 TABLET (500 MG TOTAL) BY MOUTH 2 (TWO) TIMES DAILY WITH A MEAL.   Blood Glucose Monitoring Suppl (ONE TOUCH ULTRA 2) w/Device KIT Use to check FSBS fasting daily. Dx: E11.59   No facility-administered encounter medications on file as of 04/28/2021.    Follow-up: No follow-ups on file.   Becky Sax, MD

## 2021-05-03 ENCOUNTER — Other Ambulatory Visit: Payer: Self-pay | Admitting: Internal Medicine

## 2021-05-03 ENCOUNTER — Encounter: Payer: Self-pay | Admitting: Pharmacist

## 2021-05-03 ENCOUNTER — Other Ambulatory Visit: Payer: Self-pay

## 2021-05-03 ENCOUNTER — Ambulatory Visit (INDEPENDENT_AMBULATORY_CARE_PROVIDER_SITE_OTHER): Payer: BC Managed Care – PPO | Admitting: Podiatry

## 2021-05-03 ENCOUNTER — Encounter: Payer: Self-pay | Admitting: Podiatry

## 2021-05-03 ENCOUNTER — Telehealth: Payer: Self-pay | Admitting: Family Medicine

## 2021-05-03 VITALS — BP 147/86

## 2021-05-03 DIAGNOSIS — B351 Tinea unguium: Secondary | ICD-10-CM | POA: Diagnosis not present

## 2021-05-03 DIAGNOSIS — M79674 Pain in right toe(s): Secondary | ICD-10-CM

## 2021-05-03 DIAGNOSIS — M2012 Hallux valgus (acquired), left foot: Secondary | ICD-10-CM

## 2021-05-03 DIAGNOSIS — M2042 Other hammer toe(s) (acquired), left foot: Secondary | ICD-10-CM

## 2021-05-03 DIAGNOSIS — E119 Type 2 diabetes mellitus without complications: Secondary | ICD-10-CM

## 2021-05-03 DIAGNOSIS — M2011 Hallux valgus (acquired), right foot: Secondary | ICD-10-CM | POA: Diagnosis not present

## 2021-05-03 DIAGNOSIS — M2141 Flat foot [pes planus] (acquired), right foot: Secondary | ICD-10-CM | POA: Diagnosis not present

## 2021-05-03 DIAGNOSIS — M2041 Other hammer toe(s) (acquired), right foot: Secondary | ICD-10-CM

## 2021-05-03 DIAGNOSIS — M79675 Pain in left toe(s): Secondary | ICD-10-CM

## 2021-05-03 DIAGNOSIS — M2142 Flat foot [pes planus] (acquired), left foot: Secondary | ICD-10-CM

## 2021-05-03 DIAGNOSIS — E1159 Type 2 diabetes mellitus with other circulatory complications: Secondary | ICD-10-CM

## 2021-05-03 NOTE — Patient Instructions (Signed)
Diabetes Mellitus and Foot Care Foot care is an important part of your health, especially when you have diabetes. Diabetes may cause you to have problems because of poor blood flow (circulation) to your feet and legs, which can cause your skin to: Become thinner and drier. Break more easily. Heal more slowly. Peel and crack. You may also have nerve damage (neuropathy) in your legs and feet, causing decreased feeling in them. This means that you may not notice minor injuries to your feet that could lead to more serious problems. Noticing and addressing any potential problems early is the best way to prevent future foot problems. How to care for your feet Foot hygiene  Wash your feet daily with warm water and mild soap. Do not use hot water. Then, pat your feet and the areas between your toes until they are completely dry. Do not soak your feet as this can dry your skin. Trim your toenails straight across. Do not dig under them or around the cuticle. File the edges of your nails with an emery board or nail file. Apply a moisturizing lotion or petroleum jelly to the skin on your feet and to dry, brittle toenails. Use lotion that does not contain alcohol and is unscented. Do not apply lotion between your toes. Shoes and socks Wear clean socks or stockings every day. Make sure they are not too tight. Do not wear knee-high stockings since they may decrease blood flow to your legs. Wear shoes that fit properly and have enough cushioning. Always look in your shoes before you put them on to be sure there are no objects inside. To break in new shoes, wear them for just a few hours a day. This prevents injuries on your feet. Wounds, scrapes, corns, and calluses  Check your feet daily for blisters, cuts, bruises, sores, and redness. If you cannot see the bottom of your feet, use a mirror or ask someone for help. Do not cut corns or calluses or try to remove them with medicine. If you find a minor scrape,  cut, or break in the skin on your feet, keep it and the skin around it clean and dry. You may clean these areas with mild soap and water. Do not clean the area with peroxide, alcohol, or iodine. If you have a wound, scrape, corn, or callus on your foot, look at it several times a day to make sure it is healing and not infected. Check for: Redness, swelling, or pain. Fluid or blood. Warmth. Pus or a bad smell. General tips Do not cross your legs. This may decrease blood flow to your feet. Do not use heating pads or hot water bottles on your feet. They may burn your skin. If you have lost feeling in your feet or legs, you may not know this is happening until it is too late. Protect your feet from hot and cold by wearing shoes, such as at the beach or on hot pavement. Schedule a complete foot exam at least once a year (annually) or more often if you have foot problems. Report any cuts, sores, or bruises to your health care provider immediately. Where to find more information American Diabetes Association: www.diabetes.org Association of Diabetes Care & Education Specialists: www.diabeteseducator.org Contact a health care provider if: You have a medical condition that increases your risk of infection and you have any cuts, sores, or bruises on your feet. You have an injury that is not healing. You have redness on your legs or feet. You  feel burning or tingling in your legs or feet. You have pain or cramps in your legs and feet. Your legs or feet are numb. Your feet always feel cold. You have pain around any toenails. Get help right away if: You have a wound, scrape, corn, or callus on your foot and: You have pain, swelling, or redness that gets worse. You have fluid or blood coming from the wound, scrape, corn, or callus. Your wound, scrape, corn, or callus feels warm to the touch. You have pus or a bad smell coming from the wound, scrape, corn, or callus. You have a fever. You have a red  line going up your leg. Summary Check your feet every day for blisters, cuts, bruises, sores, and redness. Apply a moisturizing lotion or petroleum jelly to the skin on your feet and to dry, brittle toenails. Wear shoes that fit properly and have enough cushioning. If you have foot problems, report any cuts, sores, or bruises to your health care provider immediately. Schedule a complete foot exam at least once a year (annually) or more often if you have foot problems. This information is not intended to replace advice given to you by your health care provider. Make sure you discuss any questions you have with your health care provider. Document Revised: 10/02/2019 Document Reviewed: 10/02/2019 Elsevier Patient Education  2022 [employer]  Chronic Venous Insufficiency Chronic venous insufficiency is a condition where the leg veins cannot effectively pump blood from the legs to the heart. This happens when the vein walls are either stretched, weakened, or damaged, or when the valves inside the vein are damaged. With the right treatment, you should be able to continue with an active life. This condition is also called venous stasis. What are the causes? Common causes of this condition include: High blood pressure inside the veins (venous hypertension). Sitting or standing too long, causing increased blood pressure in the leg veins. A blood clot that blocks blood flow in a vein (deep vein thrombosis, DVT). Inflammation of a vein (phlebitis) that causes a blood clot to form. Tumors in the pelvis that cause blood to back up. What increases the risk? The following factors may make you more likely to develop this condition: Having a family history of this condition. Obesity. Pregnancy. Living without enough regular physical activity or exercise (sedentary lifestyle). Smoking. Having a job that requires long periods of standing or sitting in one place. Being a certain age. Women in their 41s  and 42s and men in their 63s are more likely to develop this condition. What are the signs or symptoms? Symptoms of this condition include: Veins that are enlarged, bulging, or twisted (varicose veins). Skin breakdown or ulcers. Reddened skin or dark discoloration of skin on the leg between the knee and ankle. Brown, smooth, tight, and painful skin just above the ankle, usually on the inside of the leg (lipodermatosclerosis). Swelling of the legs. How is this diagnosed? This condition may be diagnosed based on: Your medical history. A physical exam. Tests, such as: A procedure that creates an image of a blood vessel and nearby organs and provides information about blood flow through the blood vessel (duplex ultrasound). A procedure that tests blood flow (plethysmography). A procedure that looks at the veins using X-ray and dye (venogram). How is this treated? The goals of treatment are to help you return to an active life and to minimize pain or disability. Treatment depends on the severity of your condition, and it may include: Wearing  compression stockings. These can help relieve symptoms and help prevent your condition from getting worse. However, they do not cure the condition. Sclerotherapy. This procedure involves an injection of a solution that shrinks damaged veins. Surgery. This may involve: Removing a diseased vein (vein stripping). Cutting off blood flow through the vein (laser ablation surgery). Repairing or reconstructing a valve within the affected vein. Follow these instructions at home:   Wear compression stockings as told by your health care provider. These stockings help to prevent blood clots and reduce swelling in your legs. Take over-the-counter and prescription medicines only as told by your health care provider. Stay active by exercising, walking, or doing different activities. Ask your health care provider what activities are safe for you and how much exercise you  need. Drink enough fluid to keep your urine pale yellow. Do not use any products that contain nicotine or tobacco, such as cigarettes, e-cigarettes, and chewing tobacco. If you need help quitting, ask your health care provider. Keep all follow-up visits as told by your health care provider. This is important. Contact a health care provider if you: Have redness, swelling, or more pain in the affected area. See a red streak or line that goes up or down from the affected area. Have skin breakdown or skin loss in the affected area, even if the breakdown is small. Get an injury in the affected area. Get help right away if: You get an injury and an open wound in the affected area. You have: Severe pain that does not get better with medicine. Sudden numbness or weakness in the foot or ankle below the affected area. Trouble moving your foot or ankle. A fever. Worse or persistent symptoms. Chest pain. Shortness of breath. Summary Chronic venous insufficiency is a condition where the leg veins cannot effectively pump blood from the legs to the heart. Chronic venous insufficiency occurs when the vein walls become stretched, weakened, or damaged, or when valves within the vein are damaged. Treatment depends on how severe your condition is. It often involves wearing compression stockings and may involve having a procedure. Make sure you stay active by exercising, walking, or doing different activities. Ask your health care provider what activities are safe for you and how much exercise you need. This information is not intended to replace advice given to you by your health care provider. Make sure you discuss any questions you have with your health care provider. Document Revised: 05/25/2020 Document Reviewed: 05/25/2020 Elsevier Patient Education  2022 [employer] Inc.  Bunion A bunion (hallux valgus) is a bump that forms slowly on the inner side of the big toe joint. It occurs when the big toe turns  toward the second toe. Bunions may be small at first, but they often get larger over time. They can make walking painful. What are the causes? This condition may be caused by: Wearing narrow or pointed shoes that force the big toe to press against the other toes. Abnormal foot development that causes the foot to roll inward. Changes in the foot that are caused by certain diseases, such as rheumatoid arthritis or polio. A foot injury. What increases the risk? The following factors may make you more likely to develop this condition: Wearing shoes that squeeze the toes together. Having certain diseases, such as: Rheumatoid arthritis. Polio. Cerebral palsy. Having family members who have bunions. Being born with abnormally shaped feet (a foot deformity), such as flat feet or low arches. Doing activities that put a lot of pressure on  the feet, such as ballet dancing. What are the signs or symptoms? The main symptom of this condition is a bump on your big toe that you can notice. Other symptoms may include: Pain. Redness and inflammation around your big toe. Thick or hardened skin on your big toe or between your toes. Stiffness or loss of motion in your big toe. Trouble with walking. How is this diagnosed? This condition may be diagnosed based on your symptoms, medical history, and activities. You may also have tests and imaging, such as: X-rays. These allow your health care provider to check the position of the bones in your foot and look for damage to your joint. They also help your health care provider determine the severity of your bunion and the best way to treat it. Joint aspiration. In this test, a sample of fluid is removed from the toe joint. This test may be done if you are in a lot of pain. It helps rule out diseases that cause painful swelling of the joints, such as arthritis or gout. How is this treated? Treatment depends on the severity of your symptoms. The goal of treatment is  to relieve symptoms and prevent your bunion from getting worse. Your health care provider may recommend: Wearing shoes that have a wide toe box, or using bunion pads to cushion the affected area. Taping your toes together to keep them in a normal position. Placing a device inside your shoe (orthotic device) to help reduce pressure on your toe joint. Taking medicine to ease pain and inflammation. Putting ice or heat on the affected area. Doing stretching exercises. Surgery, for severe cases. Follow these instructions at home: Managing pain, stiffness, and swelling   If directed, put ice on the painful area. To do this: Put ice in a plastic bag. Place a towel between your skin and the bag. Leave the ice on for 20 minutes, 2-3 times a day. Remove the ice if your skin turns bright red. This is very important. If you cannot feel pain, heat, or cold, you have a greater risk of damage to the area. If directed, apply heat to the affected area before you exercise. Use the heat source that your health care provider recommends, such as a moist heat pack or a heating pad. Place a towel between your skin and the heat source. Leave the heat on for 20-30 minutes. Remove the heat if your skin turns bright red. This is especially important if you are unable to feel pain, heat, or cold. You have a greater risk of getting burned. General instructions Do exercises as told by your health care provider. Support your toe joint with proper footwear, shoe padding, or taping as told by your health care provider. Take over-the-counter and prescription medicines only as told by your health care provider. Do not use any products that contain nicotine or tobacco, such as cigarettes, e-cigarettes, and chewing tobacco. If you need help quitting, ask your health care provider. Keep all follow-up visits. This is important. Contact a health care provider if: Your symptoms get worse. Your symptoms do not improve in 2  weeks. Get help right away if: You have severe pain and trouble with walking. Summary A bunion is a bump on the inner side of the big toe joint that forms when the big toe turns toward the second toe. Bunions can make walking painful. Treatment depends on the severity of your symptoms. Support your toe joint with proper footwear, shoe padding, or taping as told by  your health care provider. This information is not intended to replace advice given to you by your health care provider. Make sure you discuss any questions you have with your health care provider. Document Revised: 07/18/2019 Document Reviewed: 07/18/2019 Elsevier Patient Education  2022 [employer].

## 2021-05-03 NOTE — Progress Notes (Signed)
ANNUAL DIABETIC FOOT EXAM  Subjective: Adam [name] presents today for for diabetic foot evaluation and painful elongated mycotic toenails 1-5 bilaterally which are tender when wearing enclosed shoe gear. Pain is relieved with periodic professional debridement..  Patient relates 2 year h/o diabetes.  Patient denies any h/o foot wounds.  Patient admits symptoms of foot tingling on occasion.  Patient did not check blood glucose this morning. He has been out of testing strips which he purchased today.  Risk factors: diabetes, hyperlipidemia, h/o MI, CAD.  Adam Mai, MD is patient's PCP. Last visit was April 28, 2021.  Past Medical History:  Diagnosis Date   Coronary artery disease    Diabetes mellitus without complication ([place])    Myocardial infarction ([place])    PCI/DES to pLCx, PTCA to PDA, small nondominent RCA with 95%   Occasional recreational drug use    Patient Active Problem List   Diagnosis Date Noted   Hypertension 04/28/2021   Coronary artery disease involving coronary bypass graft of native heart without angina pectoris 10/21/2019   Cocaine use disorder, mild, in early remission ([place]) 04/15/2019   Hyperlipidemia 02/06/2019   Type 2 diabetes mellitus ([place]) 02/04/2019   NSTEMI (non-ST elevated myocardial infarction) ([place]) 02/04/2019   Past Surgical History:  Procedure Laterality Date   CORONARY BALLOON ANGIOPLASTY N/A 02/05/2019   Procedure: CORONARY BALLOON ANGIOPLASTY;  Surgeon: Adam Man, MD;  Location: [facility];  Service: Cardiovascular;  Laterality: N/A;   CORONARY STENT INTERVENTION N/A 02/05/2019   Procedure: CORONARY STENT INTERVENTION;  Surgeon: Adam Man, MD;  Location: [facility];  Service: Cardiovascular;  Laterality: N/A;   CORONARY THROMBECTOMY N/A 02/05/2019   Procedure: Coronary Thrombectomy;  Surgeon: Adam Man, MD;  Location: [facility];  Service: Cardiovascular;  Laterality: N/A;   LEFT HEART  CATH AND CORONARY ANGIOGRAPHY N/A 02/05/2019   Procedure: LEFT HEART CATH AND CORONARY ANGIOGRAPHY;  Surgeon: Adam Man, MD;  Location: [facility];  Service: Cardiovascular;  Laterality: N/A;   Current Outpatient Medications on File Prior to Visit  Medication Sig Dispense Refill   aspirin EC 81 MG EC tablet Take 1 tablet (81 mg total) by mouth daily. 90 tablet 3   atorvastatin (LIPITOR) 40 MG tablet Take 1 tablet (40 mg total) by mouth daily at 6 PM. 90 tablet 2   Blood Glucose Monitoring Suppl (ONE TOUCH ULTRA 2) w/Device KIT Use to check FSBS fasting daily. Dx: E11.59 1 kit 0   Blood Pressure KIT Check blood pressure once to twice daily. Notify PCP if readings greater than 160/100. ICD10 code-I10 dispense brand covered by health insurance plan 1 kit 0   clopidogrel (PLAVIX) 75 MG tablet Take 1 tablet (75 mg total) by mouth daily. 90 tablet 3   glipiZIDE (GLUCOTROL XL) 10 MG 24 hr tablet Take 1 tablet (10 mg total) by mouth daily with breakfast. 90 tablet 0   hydrochlorothiazide (HYDRODIURIL) 25 MG tablet Take 0.5 tablets (12.5 mg total) by mouth daily. (Patient taking differently: Take 25 mg by mouth daily.) 90 tablet 3   Lancet Devices (ONE TOUCH DELICA LANCING DEV) MISC Use to check FSBS fasting daily. Dx: E11.59 1 each 0   Lancets Misc. (ACCU-CHEK FASTCLIX LANCET) KIT Use to check FSBS fasting daily. Dx: E11.59 1 kit 0   metFORMIN (GLUCOPHAGE) 1000 MG tablet Take 1 tablet (1,000 mg total) by mouth 2 (two) times daily with a meal. 180 tablet 0   metoprolol  tartrate (LOPRESSOR) 25 MG tablet Take 0.5 tablets (12.5 mg total) by mouth 2 (two) times daily. Pt needs to keep upcoming appt in Jan, 2023 for further refills 90 tablet 2   Multiple Vitamins-Minerals (MULTI FOR HIM PO) Take by mouth. gummies with omega 3     nitroGLYCERIN (NITROSTAT) 0.4 MG SL tablet PLACE 1 TABLET UNDER THE TONUGE EVERY 5 MINUTES AS NEEDED FOR CHEST PAIN 25 tablet 1   OneTouch Delica Lancets 21H MISC Use to  check FSBS fasting daily. Dx: E11.59 100 each 1   potassium chloride (KLOR-CON M) 10 MEQ tablet Take 0.5 tablets (5 mEq total) by mouth daily. 45 tablet 3   No current facility-administered medications on file prior to visit.    No Known Allergies Social History   Occupational History   Not on file  Tobacco Use   Smoking status: Never   Smokeless tobacco: Never  Vaping Use   Vaping Use: Never used  Substance and Sexual Activity   Alcohol use: Yes    Comment: SOCIAL   Drug use: Not Currently    Types: Cocaine   Sexual activity: Not on file   Family History  Problem Relation Age of Onset   Heart disease Mother    Diabetes Father    Breast cancer Sister    Multiple sclerosis Sister    Stomach cancer Sister    Healthy Neg Hx    Immunization History  Administered Date(s) Administered   Fluad Quad(high Dose 65+) 04/28/2021   Influenza,inj,Quad PF,6+ Mos 02/06/2019   Moderna SARS-COV2 Booster Vaccination 01/23/2020   Moderna Sars-Covid-2 Vaccination 05/05/2019, 06/05/2019, 10/17/2019, 01/23/2020     Review of Systems: Negative except as noted in the HPI.   Objective: Vitals:   05/03/21 1448  BP: (!) 147/86    Adam [name] is a pleasant 66 y.o. male in NAD. AAO X 3.  Vascular Examination: CFT <3 seconds b/l LE. Faintly palpable DP pulses b/l LE. Diminished PT pulse(s) b/l LE. Pedal hair absent. No pain with calf compression b/l. Lower extremity skin temperature gradient within normal limits. Varicosities present b/l. Evidence of chronic venous insufficiency b/l LE. No ischemia or gangrene noted b/l LE. No cyanosis or clubbing noted b/l LE.  Dermatological Examination: Pedal skin thin, shiny and atrophic b/l LE. No open wounds b/l LE. No interdigital macerations noted b/l LE. Toenails 1-5 b/l elongated, discolored, dystrophic, thickened, crumbly with subungual debris and tenderness to dorsal palpation. No hyperkeratotic nor porokeratotic lesions present on today's  visit.  Musculoskeletal Examination: Normal muscle strength 5/5 to all lower extremity muscle groups bilaterally. HAV with bunion deformity noted b/l LE. Hammertoe deformity noted 2-5 b/l. Pes planus deformity noted bilateral LE.Marland Kitchen No pain, crepitus or joint limitation noted with ROM b/l LE.  Patient ambulates independently without assistive aids.  Footwear Assessment: Does the patient wear appropriate shoes? Yes. Does the patient need inserts/orthotics? Yes.  Neurological Examination: Protective sensation decreased with 10 gram monofilament b/l. Vibratory sensation intact b/l.  Hemoglobin A1C Latest Ref Rng & Units 04/28/2021 08/02/2020  HGBA1C 4.0 - 5.6 % 8.1(A) 6.3(H)  Some recent data might be hidden   Assessment: 1. Pain due to onychomycosis of toenails of both feet   2. Hallux valgus, acquired, bilateral   3. Acquired hammertoes of both feet   4. Pes planus of both feet   5. Type 2 diabetes mellitus with other circulatory complication, without long-term current use of insulin ([place])   6. Encounter for diabetic foot exam ([place])  ADA Risk Categorization: High Risk  Patient has one or more of the following: Loss of protective sensation Absent pedal pulses Severe Foot deformity History of foot ulcer  Plan: -Examined patient. -Diabetic foot examination performed today. -Continue foot and shoe inspections daily. Monitor blood glucose per PCP/Endocrinologist's recommendations. -Patient to continue soft, supportive shoe gear daily. -Mycotic toenails 1-5 bilaterally were debrided in length and girth with sterile nail nippers and dremel without incident. -Patient/POA to call should there be question/concern in the interim.  Return in about 3 months (around 07/31/2021).  Marzetta Board, DPM

## 2021-05-03 NOTE — Telephone Encounter (Signed)
Opened in error

## 2021-05-10 ENCOUNTER — Telehealth: Payer: Self-pay | Admitting: Family Medicine

## 2021-05-10 NOTE — Telephone Encounter (Signed)
Pt's Fiance states he's Been taking Centrum-  Fiance wants to give him Live better Vitamins , want to know if it's Ok according to other medications - asked for a call back at  [phone] [facility].

## 2021-05-10 NOTE — Telephone Encounter (Signed)
Patient was notified of provider ok

## 2021-05-17 ENCOUNTER — Telehealth: Payer: Self-pay | Admitting: Family Medicine

## 2021-05-17 NOTE — Telephone Encounter (Signed)
Pt's Fiance is asking for a call either from PCP or nurse regarding the normal sugar numbers that are expected. Mrs. Adam [name] states his numbers lately are between 52 -99 and her friend told her it should be in the hundreds. Pt's fiance asked for a call back at  [phone] when possible.  Thank you.

## 2021-05-17 NOTE — Telephone Encounter (Signed)
Patient was called back and was told that FBS should be between 70-99. Patient was very appreciative

## 2021-05-24 ENCOUNTER — Ambulatory Visit: Payer: BC Managed Care – PPO

## 2021-05-27 ENCOUNTER — Other Ambulatory Visit: Payer: Self-pay

## 2021-05-27 ENCOUNTER — Ambulatory Visit (INDEPENDENT_AMBULATORY_CARE_PROVIDER_SITE_OTHER): Payer: BC Managed Care – PPO | Admitting: Pharmacist

## 2021-05-27 VITALS — BP 104/70 | HR 57

## 2021-05-27 DIAGNOSIS — I10 Essential (primary) hypertension: Secondary | ICD-10-CM | POA: Diagnosis not present

## 2021-05-27 DIAGNOSIS — I2510 Atherosclerotic heart disease of native coronary artery without angina pectoris: Secondary | ICD-10-CM

## 2021-05-27 NOTE — Progress Notes (Signed)
Patient ID: ALDAN CAMEY                 DOB: 08/20/1955                      MRN: [id number] ?,  ?/ ? ? ?HPI: ?Adam [name] is a 66 y.o. male patient of Dr. Antionette Char referred by Christen Bame, NP to HTN clinic. PMH is significant for NSTEMI s/p PCI/DES, ischemic cardiomyopathy, hyperlipidemia, DM, and substance abuse. Patient was seen by Sharyn Lull 04/13/21. BP was elevated at 178/98. He was started on HCTZ 47m daily and KCL 10 MEQ. Repeat BMP showed a slight increase in Scr. HCTZ was decreased to 12.539mdaily however patient had continued to take 2541maily. Repeat scr was stable, therefore HCTZ was continued at 11m83mily. No medication changes were made at last visit, proper technique was reviewed. ? ?Home bp ?Dizziness, lightheadedness, headache, blurred vision, SOB, swelling ?When are you checking? ?Diet? ?Change HCTZ on med list to 11mg56mn add amlodipine ? ?Patient presents today to HTN clinic accompanied by his fiance. Fiance helps with medications and checking his blood pressure. Brings in a list of blood pressures. Checking at 7AM and 7 PM. Mostly at goal. Denies dizziness, lightheadedness, headache, blurred vision, SOB, swelling. Checking blood pressure at the table. ? ?Current HTN meds: HCTZ 11mg 26my, metoprolol tartrate 12.5mg tw57m a day ?Previously tried:  ?BP goal: <130/80 ? ?Family History: The patient's family history includes Breast cancer in his sister; Diabetes in his father; Heart disease in his mother; Multiple sclerosis in his sister; Stomach cancer in his sister ? ?Social History:  ?Social History  ? ?Socioeconomic History  ? Marital status: Single  ?  Spouse name: Not on file  ? Number of children: 12  ? Years of education: 12  ? H86hest education level: Not on file  ?Occupational History  ? Not on file  ?Tobacco Use  ? Smoking status: Never  ? Smokeless tobacco: Never  ?Vaping Use  ? Vaping Use: Never used  ?Substance and Sexual Activity  ? Alcohol use: Yes  ?  Comment: SOCIAL  ?  Drug use: Not Currently  ?  Types: Cocaine  ? Sexual activity: Not on file  ?Other Topics Concern  ? Not on file  ?Social History Narrative  ? Not on file  ? ?Social Determinants of Health  ? ?Financial Resource Strain: Not on file  ?Food Insecurity: Not on file  ?Transportation Needs: Not on file  ?Physical Activity: Not on file  ?Stress: Not on file  ?Social Connections: Not on file  ?Intimate Partner Violence: Not on file  ? ? ?Diet: 1 8 oz cup coffee per day, 1 soda per day, does not salt food ?Hot dog once a week  ? 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 ?Home BP readings: 123/72, 134/73, 142/81, 125/70, 127/74, 117/69, 127/74, 140/68, 145/83, 142/76, 142/77, 128/67, 168/84, 135/81, 142/78, 127/82, 145/74, 125/74, 127/75, 125/78, 138/74,,128/72, 127/74, 138/71, 125/79, 135/78, 129/76, 147/77, 127/70, 132/79, 127/73, 131/75, 121/71 ? ?Wt Readings from Last 3 Encounters:  ?04/28/21 228 lb (103.4 kg)  ?04/13/21 230 lb 12.8 oz (104.7 kg)  ?08/02/20 223 lb 3.2 oz (101.2 kg)  ? ?BP Readings from Last 3 Encounters:  ?05/03/21 (!) 147/86  ?04/28/21 128/82  ?04/28/21 (!) 147/85  ? ?Pulse Readings from Last 3 Encounters:  ?04/28/21 71  ?04/28/21 75  ?04/13/21 70  ? ? ?Renal function: ?CrCl cannot be calculated (  Patient's most recent lab result is older than the maximum 21 days allowed.). ? ?Past Medical History:  ?Diagnosis Date  ? Coronary artery disease   ? Diabetes mellitus without complication ([place])   ? Myocardial infarction [facility])   ? PCI/DES to pLCx, PTCA to PDA, small nondominent RCA with 95%  ? Occasional recreational drug use   ? ? ?Current Outpatient Medications on File Prior to Visit  ?Medication Sig Dispense Refill  ? aspirin EC 81 MG EC tablet Take 1 tablet (81 mg total) by mouth daily. 90 tablet 3  ? atorvastatin (LIPITOR) 40 MG tablet Take 1 tablet (40 mg total) by mouth daily at 6 PM. 90 tablet 2  ? Blood Glucose Monitoring Suppl (ONE TOUCH ULTRA 2) w/Device KIT Use to check FSBS fasting  daily. Dx: E11.59 1 kit 0  ? Blood Pressure KIT Check blood pressure once to twice daily. Notify PCP if readings greater than 160/100. ICD10 code-I10 dispense brand covered by health insurance plan 1 kit 0  ? clopidogrel (PLAVIX) 75 MG tablet Take 1 tablet (75 mg total) by mouth daily. 90 tablet 3  ? glipiZIDE (GLUCOTROL XL) 10 MG 24 hr tablet Take 1 tablet (10 mg total) by mouth daily with breakfast. 90 tablet 0  ? hydrochlorothiazide (HYDRODIURIL) 25 MG tablet Take 0.5 tablets (12.5 mg total) by mouth daily. (Patient taking differently: Take 25 mg by mouth daily.) 90 tablet 3  ? Lancet Devices (ONE TOUCH DELICA LANCING DEV) MISC Use to check FSBS fasting daily. Dx: E11.59 1 each 0  ? Lancets Misc. (ACCU-CHEK FASTCLIX LANCET) KIT Use to check FSBS fasting daily. Dx: E11.59 1 kit 0  ? metFORMIN (GLUCOPHAGE) 1000 MG tablet Take 1 tablet (1,000 mg total) by mouth 2 (two) times daily with a meal. 180 tablet 0  ? metoprolol tartrate (LOPRESSOR) 25 MG tablet Take 0.5 tablets (12.5 mg total) by mouth 2 (two) times daily. Pt needs to keep upcoming appt in Jan, 2023 for further refills 90 tablet 2  ? Multiple Vitamins-Minerals (MULTI FOR HIM PO) Take by mouth. gummies with omega 3    ? nitroGLYCERIN (NITROSTAT) 0.4 MG SL tablet PLACE 1 TABLET UNDER THE TONUGE EVERY 5 MINUTES AS NEEDED FOR CHEST PAIN 25 tablet 1  ? OneTouch Delica Lancets 81X MISC Use to check FSBS fasting daily. Dx: E11.59 100 each 1  ? ONETOUCH ULTRA test strip USE TO CHECK FSBS FASTING DAILY. DX: E11.59 100 strip 1  ? potassium chloride (KLOR-CON M) 10 MEQ tablet Take 0.5 tablets (5 mEq total) by mouth daily. 45 tablet 3  ? ?No current facility-administered medications on file prior to visit.  ? ? ?No Known Allergies ? ? ?Assessment/Plan: ? ?1. Hypertension - Blood pressure at goal of <130/80 in clinic. Home readings are mostly at goal. Continue hydrochlorothiazide 85m daily, metoprolol tartrate 12.511mtwice a day. I recommended he add a few days of  resistance training to help with BP, blood sugar and strength. Follow up as needed. ? ?Thank you, ? ?MeRamond DialPharm.D, BCPS, CPP ?[id number] [id number]?Phone: [phone]: [phone]? ? ?

## 2021-05-27 NOTE — Patient Instructions (Addendum)
Continue taking hydrochlorothiazide 25mg  daily, metoprolol tartrate 12.5mg  twice a day ? ?I recommend adding some resistance training in a few days a week ? ?Call me with any questions [phone] ?

## 2021-06-17 ENCOUNTER — Ambulatory Visit: Payer: Self-pay

## 2021-06-17 NOTE — Telephone Encounter (Signed)
?  Chief Complaint: Cough from COVID ?Symptoms: Cough ?Frequency: yesterday ?Pertinent Negatives: Patient denies SOB, fever ?Disposition: [] ED /[] Urgent Care (no appt availability in office) / [] Appointment(In office/virtual)/ []  [facility]/ [x] Home Care/ [] Refused Recommended Disposition /[] [facility]/ []  Follow-up with PCP ?Additional Notes: Pt was seen at [facility] yesterday for COVID.  Pt currently still has cough, but not worsening symptoms. Pt will continue home care with medications for UC. Pt will seek additional care if he becomes SOB, has fever or any other troubling symptoms. ? ? ?Summary: positive covid  ? Pt's girlfriend called in for pt. Pt has tested positive for covid yesterday. Pt was seen and prescribed a medication to help. Girlfriend says that pt is experiencing a cough, congestion and feels that the coughing has his chest hurting. Girlfriend is not currently with pt.  ?  ?  ? ?Reason for Disposition ? [1] COVID-19 diagnosed by doctor (or NP/PA) AND [2] mild symptoms (e.g., cough, fever, others) AND [3] no complications or SOB ? ?Answer Assessment - Initial Assessment Questions ?1. COVID-19 DIAGNOSIS: "Who made your COVID-19 diagnosis?" "Was it confirmed by a positive lab test or self-test?" If not diagnosed by a doctor (or NP/PA), ask "Are there lots of cases (community spread) where you live?" Note: See public health department website, if unsure. ?    yesterday ?2. COVID-19 EXPOSURE: "Was there any known exposure to COVID before the symptoms began?" CDC Definition of close contact: within 6 feet (2 meters) for a total of 15 minutes or more over a 24-hour period.  ?     ?3. ONSET: "When did the COVID-19 symptoms start?"  ?     ?4. WORST SYMPTOM: "What is your worst symptom?" (e.g., cough, fever, shortness of breath, muscle aches) ?    Cough - chest hurting from cough ?5. COUGH: "Do you have a cough?" If Yes, ask: "How bad is the cough?"   ?    Yes - breathing fine ?6. FEVER:  "Do you have a fever?" If Yes, ask: "What is your temperature, how was it measured, and when did it start?" ?    no ?7. RESPIRATORY STATUS: "Describe your breathing?" (e.g., shortness of breath, wheezing, unable to speak)  ?     ?8. BETTER-SAME-WORSE: "Are you getting better, staying the same or getting worse compared to yesterday?"  If getting worse, ask, "In what way?" ?     ?9. HIGH RISK DISEASE: "Do you have any chronic medical problems?" (e.g., asthma, heart or lung disease, weak immune system, obesity, etc.) ?     ?10. VACCINE: "Have you had the COVID-19 vaccine?" If Yes, ask: "Which one, how many shots, when did you get it?" ?       ?11. BOOSTER: "Have you received your COVID-19 booster?" If Yes, ask: "Which one and when did you get it?" ?       ?12. PREGNANCY: "Is there any chance you are pregnant?" "When was your last menstrual period?" ?       ?13. OTHER SYMPTOMS: "Do you have any other symptoms?"  (e.g., chills, fatigue, headache, loss of smell or taste, muscle pain, sore throat) ?       ?14. O2 SATURATION MONITOR:  "Do you use an oxygen saturation monitor (pulse oximeter) at home?" If Yes, ask "What is your reading (oxygen level) today?" "What is your usual oxygen saturation reading?" (e.g., 95%) ? ?Protocols used: Coronavirus (COVID-19) Diagnosed or Suspected-A-AH ? ?

## 2021-06-18 ENCOUNTER — Other Ambulatory Visit: Payer: Self-pay | Admitting: Cardiovascular Disease

## 2021-07-20 ENCOUNTER — Other Ambulatory Visit: Payer: Self-pay | Admitting: Family Medicine

## 2021-07-20 DIAGNOSIS — E1159 Type 2 diabetes mellitus with other circulatory complications: Secondary | ICD-10-CM

## 2021-07-25 ENCOUNTER — Ambulatory Visit: Payer: BC Managed Care – PPO | Admitting: Family Medicine

## 2021-08-17 ENCOUNTER — Ambulatory Visit (INDEPENDENT_AMBULATORY_CARE_PROVIDER_SITE_OTHER): Payer: Medicare Other | Admitting: Family Medicine

## 2021-08-17 ENCOUNTER — Encounter: Payer: Self-pay | Admitting: Family Medicine

## 2021-08-17 VITALS — BP 137/81 | HR 57 | Temp 98.1°F | Resp 16 | Wt 216.4 lb

## 2021-08-17 DIAGNOSIS — E1159 Type 2 diabetes mellitus with other circulatory complications: Secondary | ICD-10-CM | POA: Diagnosis not present

## 2021-08-17 DIAGNOSIS — I10 Essential (primary) hypertension: Secondary | ICD-10-CM

## 2021-08-17 DIAGNOSIS — E7849 Other hyperlipidemia: Secondary | ICD-10-CM

## 2021-08-17 LAB — POCT GLYCOSYLATED HEMOGLOBIN (HGB A1C): Hemoglobin A1C: 5 % (ref 4.0–5.6)

## 2021-08-17 MED ORDER — METFORMIN HCL 500 MG PO TABS: 500.0000 mg | ORAL_TABLET | Freq: Two times a day (BID) | ORAL | 1 refills | Status: DC

## 2021-08-18 NOTE — Progress Notes (Signed)
Established Patient Office Visit  Subjective    Patient ID: Adam [name], male    DOB: 12/08/1955  Age: 66 y.o. MRN: [id number]  CC:  Chief Complaint  Patient presents with   Follow-up   Diabetes    HPI Adam [name] presents for routine follow up of chronic med issues including diabetes and hypertension. Patient denies acute complaints or concerns.    Outpatient Encounter Medications as of 08/17/2021  Medication Sig   aspirin EC 81 MG EC tablet Take 1 tablet (81 mg total) by mouth daily.   atorvastatin (LIPITOR) 40 MG tablet Take 1 tablet by mouth.   Blood Glucose Monitoring Suppl (ONE TOUCH ULTRA 2) w/Device KIT Use to check FSBS fasting daily. Dx: E11.59   Blood Pressure KIT Check blood pressure once to twice daily. Notify PCP if readings greater than 160/100. ICD10 code-I10 dispense brand covered by health insurance plan   clopidogrel (PLAVIX) 75 MG tablet Take 1 tablet (75 mg total) by mouth daily.   glipiZIDE (GLUCOTROL XL) 10 MG 24 hr tablet Take 1 tablet by mouth daily with breakfast.   hydrochlorothiazide (HYDRODIURIL) 25 MG tablet Take 1 tablet by mouth daily.   Lancet Devices (ONE TOUCH DELICA LANCING DEV) MISC Use to check FSBS fasting daily. Dx: E11.59   Lancets Misc. (ACCU-CHEK FASTCLIX LANCET) KIT Use to check FSBS fasting daily. Dx: E11.59   metFORMIN (GLUCOPHAGE) 500 MG tablet Take 1 tablet (500 mg total) by mouth 2 (two) times daily with a meal.   metoprolol tartrate (LOPRESSOR) 25 MG tablet Take 0.5 tablets (12.5 mg total) by mouth 2 (two) times daily. Pt needs to keep upcoming appt in Jan, 2023 for further refills   Multiple Vitamins-Minerals (MULTI FOR HIM PO) Take by mouth. gummies with omega 3   nitroGLYCERIN (NITROSTAT) 0.4 MG SL tablet PLACE 1 TABLET UNDER THE TONUGE EVERY 5 MINUTES AS NEEDED FOR CHEST PAIN   OneTouch Delica Lancets 43P MISC Use to check FSBS fasting daily. Dx: E11.59   ONETOUCH ULTRA test strip USE TO CHECK FSBS FASTING DAILY. DX:  E11.59   potassium chloride (KLOR-CON M10) 10 MEQ tablet Take by mouth.   [DISCONTINUED] atorvastatin (LIPITOR) 40 MG tablet Take 1 tablet (40 mg total) by mouth daily at 6 PM.   [DISCONTINUED] glipiZIDE (GLUCOTROL XL) 10 MG 24 hr tablet TAKE 1 TABLET (10 MG TOTAL) BY MOUTH DAILY WITH BREAKFAST.   [DISCONTINUED] metFORMIN (GLUCOPHAGE) 1000 MG tablet Take 1 tablet (1,000 mg total) by mouth 2 (two) times daily with a meal.   [DISCONTINUED] potassium chloride (KLOR-CON M) 10 MEQ tablet Take 0.5 tablets (5 mEq total) by mouth daily.   [DISCONTINUED] hydrochlorothiazide (HYDRODIURIL) 25 MG tablet Take 0.5 tablets (12.5 mg total) by mouth daily. (Patient taking differently: Take 25 mg by mouth daily.)   No facility-administered encounter medications on file as of 08/17/2021.    Past Medical History:  Diagnosis Date   Coronary artery disease    Diabetes mellitus without complication ([place])    Myocardial infarction ([place])    PCI/DES to pLCx, PTCA to PDA, small nondominent RCA with 95%   Occasional recreational drug use     Past Surgical History:  Procedure Laterality Date   CORONARY BALLOON ANGIOPLASTY N/A 02/05/2019   Procedure: CORONARY BALLOON ANGIOPLASTY;  Surgeon: Leonie Man, MD;  Location: [place] [facility];  Service: Cardiovascular;  Laterality: N/A;   CORONARY STENT INTERVENTION N/A 02/05/2019   Procedure: CORONARY STENT INTERVENTION;  Surgeon: Leonie Man,  MD;  Location: [place] [facility];  Service: Cardiovascular;  Laterality: N/A;   CORONARY THROMBECTOMY N/A 02/05/2019   Procedure: Coronary Thrombectomy;  Surgeon: Leonie Man, MD;  Location: [place] [facility];  Service: Cardiovascular;  Laterality: N/A;   LEFT HEART CATH AND CORONARY ANGIOGRAPHY N/A 02/05/2019   Procedure: LEFT HEART CATH AND CORONARY ANGIOGRAPHY;  Surgeon: Leonie Man, MD;  Location: [place] [facility];  Service: Cardiovascular;  Laterality: N/A;    Family History  Problem Relation Age of  Onset   Heart disease Mother    Diabetes Father    Breast cancer Sister    Multiple sclerosis Sister    Stomach cancer Sister    Healthy Neg Hx     Social History   Socioeconomic History   Marital status: Single    Spouse name: Not on file   Number of children: 12   Years of education: 12   Highest education level: Not on file  Occupational History   Not on file  Tobacco Use   Smoking status: Never   Smokeless tobacco: Never  Vaping Use   Vaping Use: Never used  Substance and Sexual Activity   Alcohol use: Yes    Comment: SOCIAL   Drug use: Not Currently    Types: Cocaine   Sexual activity: Not on file  Other Topics Concern   Not on file  Social History Narrative   Not on file   Social Determinants of Health   Financial Resource Strain: Not on file  Food Insecurity: Not on file  Transportation Needs: Not on file  Physical Activity: Not on file  Stress: Not on file  Social Connections: Not on file  Intimate Partner Violence: Not on file    Review of Systems  All other systems reviewed and are negative.      Objective    BP 137/81   Pulse (!) 57   Temp 98.1 F (36.7 C) (Oral)   Resp 16   Wt 216 lb 6.4 oz (98.2 kg)   SpO2 94%   BMI 26.34 kg/m   Physical Exam Vitals and nursing note reviewed.  Constitutional:      General: He is not in acute distress. Cardiovascular:     Rate and Rhythm: Normal rate and regular rhythm.  Pulmonary:     Effort: Pulmonary effort is normal.     Breath sounds: Normal breath sounds.  Abdominal:     Palpations: Abdomen is soft.     Tenderness: There is no abdominal tenderness.  Musculoskeletal:     Right lower leg: No edema.     Left lower leg: No edema.  Neurological:     General: No focal deficit present.     Mental Status: He is alert and oriented to person, place, and time.        Assessment & Plan:   1. Type 2 diabetes mellitus with other circulatory complication, without long-term current use of  insulin (HCC) A1c is much improved and better than goal. Metformin decreased from 1062m to 5084mbid . Monitor  - POCT glycosylated hemoglobin (Hb A1C)  2. Essential hypertension Appears stable with present management. Continue and monitor  3. Other hyperlipidemia Continue present management.     Return in about 6 months (around 02/17/2022) for follow up.   WiBecky SaxMD

## 2021-08-30 ENCOUNTER — Other Ambulatory Visit: Payer: Self-pay | Admitting: Cardiovascular Disease

## 2021-08-31 ENCOUNTER — Other Ambulatory Visit: Payer: Self-pay | Admitting: Family Medicine

## 2021-08-31 NOTE — Telephone Encounter (Signed)
Requested Prescriptions  Pending Prescriptions Disp Refills  . ONETOUCH ULTRA test strip [employer] Med Name: ONE TOUCH ULTRA BLUE TEST STRP] 100 strip 3    Sig: USE TO CHECK FSBS FASTING DAILY. DX: E11.59     Endocrinology: Diabetes - Testing Supplies Passed - 08/31/2021  4:14 PM      Passed - Valid encounter within last 12 months    Recent Outpatient Visits          2 weeks ago Type 2 diabetes mellitus with other circulatory complication, without long-term current use of insulin [facility])   Primary Care at [facility], MD   4 months ago Type 2 diabetes mellitus with other circulatory complication, without long-term current use of insulin [facility])   Primary Care at [facility], MD   1 year ago Type 2 diabetes mellitus with other circulatory complication, without long-term current use of insulin [facility])   Primary Care at [facility], Kandee Keen, MD   2 years ago    Primary Care at [facility]   2 years ago Encounter to establish care   Primary Care at [facility], Binnie Rail, MD      Future Appointments            In 3 weeks Swinyer, Zachary George, NP [facility] [address] Office, [facility]   In 5 months Georganna Skeans, MD Primary Care at [facility]

## 2021-09-04 ENCOUNTER — Other Ambulatory Visit: Payer: Self-pay | Admitting: Family Medicine

## 2021-09-04 DIAGNOSIS — E1159 Type 2 diabetes mellitus with other circulatory complications: Secondary | ICD-10-CM

## 2021-09-05 NOTE — Telephone Encounter (Signed)
Refilled per patient request. 

## 2021-09-13 ENCOUNTER — Other Ambulatory Visit: Payer: Self-pay | Admitting: *Deleted

## 2021-09-13 DIAGNOSIS — E1159 Type 2 diabetes mellitus with other circulatory complications: Secondary | ICD-10-CM

## 2021-09-13 MED ORDER — ONETOUCH ULTRA VI STRP: ORAL_STRIP | 3 refills | Status: DC

## 2021-09-21 NOTE — Progress Notes (Unsigned)
Cardiology Office Note:    Date:  09/22/2021   ID:  Adam [name], DOB 07-Oct-1955, MRN [id number]  PCP:  Dorna Mai, MD   [facility] Providers Cardiologist:  Sherren Mocha, MD     Referring MD: Caryl Never*   Chief Complaint: 6 mo follow-up of CAD, HTN  History of Present Illness:    Adam [name] is a 66 y.o. male with a hx of CAD with NSTEMI s/p PCI/DES, ischemic cardiomyopathy, hyperlipidemia, DM, and substance abuse.   In November 2020, he presented to the ED with sudden onset of chest pain associated with diaphoresis and exertional dyspnea. Hs troponin peaked at > 27,000. He underwent LHC that revealed severe 3 vessel disease with 95% stenosis of the proximal to mid circumflex lesion thought to be the culprit lesion, 60% stenosis distal LCx, 100% stenosis of LPAV with heavily concentrated thrombus, 60% stenosis of the ostial to proximal LAD, 50% stenosis 1st diagonal, and 95% stenosis of the pRCA (very small caliber, non-dominant). He was treated with PCI/DES of pLCx, aspiration thrombectomy and PTCA only of ostial PDA w/documented suboptimal result due to significant thrombus burden. LHC revealed inferior apical hypokinesis and mildly elevated LVEDP. Echo on the following day revealed LVEF 50-55%, borderline LVH, G1DD, basal and mid inferolateral wall and basal inferior segment abnormalities, regional wall motion abnormalities, normal RV, mild MR, mild TR.   He was last seen in our office on 08/02/20 by Kathyrn Drown, NP. He was changed from Brilinta to Plavix at that visit and advised to f/u in 6 months.   His last office visit was 04/13/21 with me. He was feeling well. Had noticed some recent right shoulder pain worse after sleeping on his right shoulder. He denied chest pain, shortness of breath, fatigue, palpitations, melena, hematuria, hemoptysis, diaphoresis, weakness, presyncope, syncope, orthopnea, and PND.  Reported mild bilateral leg edema after being on  his feet all day that resolves with rest and elevation.  Denied substance abuse. Working as a Sports coach at [employer] and is busy all day on his feet and moving furniture at times. No activity intolerance or symptoms of chest pain or dyspnea with activity. I referred him to hypertension clinic and he was seen on 05/26/21 by Marcelle Overlie, [place]. Blood pressure at goal of < 130/80 on consistent basis. Advised to continue HCTZ 25 mg daily and metoprolol tartrate 12.5 mg twice daily.  Today, he is here with his fiance.  He reports he has been feeling well.  No concerns with elevated BP readings at home. He denies chest pain, shortness of breath, lower extremity edema, fatigue, palpitations, melena, hematuria, hemoptysis, diaphoresis, weakness, presyncope, syncope, orthopnea, and PND.  Abdomen is distended. He states this is from eating. Has no specific cardiac complaints.  Past Medical History:  Diagnosis Date   Coronary artery disease    Diabetes mellitus without complication ([place])    Myocardial infarction ([place])    PCI/DES to pLCx, PTCA to PDA, small nondominent RCA with 95%   Occasional recreational drug use     Past Surgical History:  Procedure Laterality Date   CORONARY BALLOON ANGIOPLASTY N/A 02/05/2019   Procedure: CORONARY BALLOON ANGIOPLASTY;  Surgeon: Leonie Man, MD;  Location: [place] [facility];  Service: Cardiovascular;  Laterality: N/A;   CORONARY STENT INTERVENTION N/A 02/05/2019   Procedure: CORONARY STENT INTERVENTION;  Surgeon: Leonie Man, MD;  Location: [place] [facility];  Service: Cardiovascular;  Laterality: N/A;   CORONARY THROMBECTOMY N/A 02/05/2019  Procedure: Coronary Thrombectomy;  Surgeon: Leonie Man, MD;  Location: [place] [facility];  Service: Cardiovascular;  Laterality: N/A;   LEFT HEART CATH AND CORONARY ANGIOGRAPHY N/A 02/05/2019   Procedure: LEFT HEART CATH AND CORONARY ANGIOGRAPHY;  Surgeon: Leonie Man, MD;  Location: [place] [facility];   Service: Cardiovascular;  Laterality: N/A;    Current Medications: Current Meds  Medication Sig   aspirin EC 81 MG EC tablet Take 1 tablet (81 mg total) by mouth daily.   atorvastatin (LIPITOR) 40 MG tablet TAKE 1 TABLET BY MOUTH DAILY AT 6 PM.   Blood Glucose Monitoring Suppl (ONE TOUCH ULTRA 2) w/Device KIT Use to check FSBS fasting daily. Dx: E11.59   Blood Pressure KIT Check blood pressure once to twice daily. Notify PCP if readings greater than 160/100. ICD10 code-I10 dispense brand covered by health insurance plan   clopidogrel (PLAVIX) 75 MG tablet Take 1 tablet (75 mg total) by mouth daily.   glipiZIDE (GLUCOTROL XL) 10 MG 24 hr tablet Take 1 tablet by mouth daily with breakfast.   glucose blood (ONETOUCH ULTRA) test strip USE TO TEST BLOOD SUGAR BID (TWO TIMES A DAY)   hydrochlorothiazide (HYDRODIURIL) 25 MG tablet Take 1 tablet by mouth daily.   Lancet Devices (ONE TOUCH DELICA LANCING DEV) MISC Use to check FSBS fasting daily. Dx: E11.59   Lancets Misc. (ACCU-CHEK FASTCLIX LANCET) KIT Use to check FSBS fasting daily. Dx: E11.59   metFORMIN (GLUCOPHAGE) 500 MG tablet Take 1 tablet (500 mg total) by mouth 2 (two) times daily with a meal.   metoprolol tartrate (LOPRESSOR) 25 MG tablet Take 0.5 tablets (12.5 mg total) by mouth 2 (two) times daily. Pt needs to keep upcoming appt in Jan, 2023 for further refills   Multiple Vitamins-Minerals (MULTI FOR HIM PO) Take by mouth. gummies with omega 3   nitroGLYCERIN (NITROSTAT) 0.4 MG SL tablet PLACE 1 TABLET UNDER THE TONUGE EVERY 5 MINUTES AS NEEDED FOR CHEST PAIN   OneTouch Delica Lancets 83J MISC Use to check FSBS fasting daily. Dx: E11.59   potassium chloride (KLOR-CON M10) 10 MEQ tablet Take by mouth.     Allergies:   Patient has no known allergies.   Social History   Socioeconomic History   Marital status: Single    Spouse name: Not on file   Number of children: 12   Years of education: 12   Highest education level: Not on file   Occupational History   Not on file  Tobacco Use   Smoking status: Never   Smokeless tobacco: Never  Vaping Use   Vaping Use: Never used  Substance and Sexual Activity   Alcohol use: Yes    Comment: SOCIAL   Drug use: Not Currently    Types: Cocaine   Sexual activity: Not on file  Other Topics Concern   Not on file  Social History Narrative   Not on file   Social Determinants of Health   Financial Resource Strain: Not on file  Food Insecurity: Not on file  Transportation Needs: Not on file  Physical Activity: Not on file  Stress: Not on file  Social Connections: Not on file     Family History: The patient's family history includes Breast cancer in his sister; Diabetes in his father; Heart disease in his mother; Multiple sclerosis in his sister; Stomach cancer in his sister. There is no history of Healthy.  ROS:   Please see the history of present illness.  All other  systems reviewed and are negative.  Labs/Other Studies Reviewed:    The following studies were reviewed today:   Echo 02/06/19  Left Ventricle: Left ventricular ejection fraction, by visual estimation,  is 50 to 55%. The left ventricle has low normal function. The left  ventricle demonstrates regional wall motion abnormalities. The left  ventricular internal cavity size was the left ventricle is normal in size. There is borderline left ventricular hypertrophy. Left ventricular diastolic parameters are consistent with Grade I diastolic dysfunction (impaired relaxation). Normal left atrial pressure.  LV Wall Scoring: The basal and mid inferolateral wall and basal inferior segment are hypokinetic.  Right Ventricle: The right ventricular size is normal. No increase in  right ventricular wall thickness. Global RV systolic function is has  normal systolic function. The tricuspid regurgitant velocity is 2.22 m/s,  and with an assumed right atrial pressure   of 3 mmHg, the estimated right ventricular systolic  pressure is normal at  22.6 mmHg.  Left Atrium: Left atrial size was normal in size.  Right Atrium: Right atrial size was normal in size  Pericardium: There is no evidence of pericardial effusion. The pericardial  effusion is circumferential. Presence of pericardial fat pad.  Mitral Valve: The mitral valve is grossly normal. There is mild thickening  of the mitral valve leaflet(s). Mild mitral annular calcification. Mild  mitral valve regurgitation.  Tricuspid Valve: The tricuspid valve is grossly normal. Tricuspid valve  regurgitation is mild.  Aortic Valve: The aortic valve is tricuspid. Aortic valve regurgitation is  not visualized. The aortic valve is structurally normal, with no evidence  of sclerosis or stenosis.  Pulmonic Valve: The pulmonic valve was grossly normal. Pulmonic valve  regurgitation is not visualized.  Aorta: Aortic dilatation noted. There is mild dilatation of the aortic  root measuring 41 mm. The ascending aorta is mildly dilated ~40 mm.  Venous: The inferior vena cava is normal in size with greater than 50%  respiratory variability, suggesting right atrial pressure of 3 mmHg.  IAS/Shunts: No atrial level shunt detected by color flow Doppler   LHC 02/05/19  CULPRIT LESION: Prox Cx to Mid Cx lesion is 95% stenosed. Dist Cx lesion is 60% stenosed. A drug-eluting stent was successfully placed covering both lesions, using a STENT RESOLUTE ONYX 3.0X22.-Postdilated to 3.3 mm Post intervention, there is a 0% residual stenosis. -- LESION SEGMENT #2: LPAV lesion is 100% stenosed - heavy concentrated thrombus 1 passive aspiration thrombectomy followed by Balloon angioplasty was performed using a BALLOON SAPPHIRE 2.5X15. Post intervention, there is a 60% residual stenosis -still significant thrombus burden LPDA lesion is 95% stenosed -unable to tell the vessel size, very difficult to wire. Plan is to treat with Aggrastat to clear up thrombus burden and improve  flow. --------- Colon Flattery LAD to Prox LAD lesion is 60% stenosed with 1st Diag lesion is 50% stenosed. - bifurcation lesion . Prox RCA (very small caliber, non-dominant) lesion is 95% stenosed. --- The left ventricular systolic function is normal. The left ventricular ejection fraction is 50-55% by visual estimate. Dist Cx lesion is 60% stenosed.   SUMMARY Severe 3 vessel disease with culprit lesion being 95% heavily thrombotic ulcerated stenosis in the proximal Circumflex with thrombotic occlusion of the proximal Left PDA, there is 60% bifurcation LAD-1stDiag stenosis (plan medical management), and 95% proximal small nondominant RCA (medical management) Successful DES PCI of pLCx using resolute Onyx DES 3.0 mm x 22 mm (3.3 mm) Aspiration Thrombectomy and PTCA only of ostial beat PDA -> suboptimal  result due to significant thrombus burden Preserved EF with inferior apical hypokinesis.  Mildly elevated LVEDP     RECOMMENDATIONS Return to nursing unit for TR band removal. We will run Aggrastat for 12 hours to improve distal flow Plan for now is to treat based on symptomatology.  Would consider LAD-1STDiag bifurcation intervention if symptoms warrant in the future. Aggressive risk factor modification --statin, (nonselective beta-blocker given recent tox screen) Counseling to avoid substance abuse      Recent Labs: 04/28/2021: ALT 24; BUN 22; Creatinine, Ser 1.31; Potassium 4.8; Sodium 137  Recent Lipid Panel    Component Value Date/Time   CHOL 127 04/28/2021 1023   TRIG 158 (H) 04/28/2021 1023   HDL 41 04/28/2021 1023   CHOLHDL 3.1 04/28/2021 1023   LDLCALC 59 04/28/2021 1023   LDLDIRECT 60 04/13/2021 1633     Risk Assessment/Calculations:        Physical Exam:    VS:  BP 128/82 (BP Location: Right Arm, Patient Position: Sitting, Cuff Size: Normal)   Pulse 71   Ht '6\' 4"'  (1.93 m)   Wt 223 lb 9.6 oz (101.4 kg)   SpO2 94%   BMI 27.22 kg/m     Wt Readings from Last 3 Encounters:   09/22/21 223 lb 9.6 oz (101.4 kg)  08/17/21 216 lb 6.4 oz (98.2 kg)  04/28/21 228 lb (103.4 kg)     GEN:  Well nourished, well developed in no acute distress HEENT: Normal NECK: No JVD; No carotid bruits LYMPHATICS: No lymphadenopathy CARDIAC: RRR, no murmurs, rubs, gallops RESPIRATORY:  Clear to auscultation without rales, wheezing or rhonchi  ABDOMEN: Soft, non-tender, obese  MUSCULOSKELETAL:  No edema; No deformity. 2+ pedal pulses, equal bilaterally SKIN: Warm and dry NEUROLOGIC:  Alert and oriented x 3 PSYCHIATRIC:  Normal affect   EKG:  EKG is ordered today.  EKG reveals NSR at 70 bpm, LAD, no ST/T wave abnormality  Diagnoses:    1. Essential hypertension   2. Coronary artery disease involving native coronary artery of native heart without angina pectoris   3. Hyperlipidemia LDL goal <70   4. Chronic HFrEF (heart failure with reduced ejection fraction) (HCC)     Assessment and Plan:     CAD native without angina: He denies chest pain, dyspnea, or other symptoms concerning for angina. No indication for further ischemic evaluation. No bleeding problems. Continue metoprolol, statin, aspirin, Plavix.   Essential hypertension: Blood pressure has improved since last office visit. He was seen in hypertension clinic and has maintained good BP control on HCTZ and metoprolol. Will get bmet today. No medication changes today.   Hyperlipidemia LDL goal < 70: LDL 57 04/13/21.  Continue atorvastatin. Will repeat in 6 months.   Chronic HFpEF: LVEF 50-55%. G1DD by echo 01/2019.  He denies dyspnea, PND, orthopnea, or activity intolerance. Appears euvolemic on exam today. Continue metoprolol.   Disposition : 6 months with Dr. Burt Knack      Medication Adjustments/Labs and Tests Ordered: Current medicines are reviewed at length with the patient today.  Concerns regarding medicines are outlined above.  Orders Placed This Encounter  Procedures   Basic Metabolic Panel (BMET)   Comp Met  (CMET)   Lipid Profile   EKG 12-Lead   No orders of the defined types were placed in this encounter.   There are no Patient Instructions on file for this visit.   Signed, Emmaline Life, NP  09/22/2021 3:44 PM    [facility]

## 2021-09-22 ENCOUNTER — Encounter: Payer: Self-pay | Admitting: Nurse Practitioner

## 2021-09-22 ENCOUNTER — Ambulatory Visit (INDEPENDENT_AMBULATORY_CARE_PROVIDER_SITE_OTHER): Payer: Medicare Other | Admitting: Nurse Practitioner

## 2021-09-22 VITALS — BP 128/82 | HR 71 | Ht 76.0 in | Wt 223.6 lb

## 2021-09-22 DIAGNOSIS — E785 Hyperlipidemia, unspecified: Secondary | ICD-10-CM | POA: Diagnosis not present

## 2021-09-22 DIAGNOSIS — I5022 Chronic systolic (congestive) heart failure: Secondary | ICD-10-CM | POA: Diagnosis not present

## 2021-09-22 DIAGNOSIS — I2510 Atherosclerotic heart disease of native coronary artery without angina pectoris: Secondary | ICD-10-CM | POA: Diagnosis not present

## 2021-09-22 DIAGNOSIS — I10 Essential (primary) hypertension: Secondary | ICD-10-CM

## 2021-09-22 NOTE — Patient Instructions (Signed)
Medication Instructions:   Your physician recommends that you continue on your current medications as directed. Please refer to the Current Medication list given to you today.   *If you need a refill on your cardiac medications before your next appointment, please call your pharmacy*   Lab Work:  TODAY!!!!BMET  Your physician recommends that you return for a FASTING lipid profile/cmet on Monday December 18. You can come in on the day of your appointment anytime between 7:30-4:30 fasting from midnight the night before.    If you have labs (blood work) drawn today and your tests are completely normal, you will receive your results only by: MyChart Message (if you have MyChart) OR A paper copy in the mail If you have any lab test that is abnormal or we need to change your treatment, we will call you to review the results.   Testing/Procedures:  None ordered.   Follow-Up: At [facility], you and your health needs are our priority.  As part of our continuing mission to provide you with exceptional heart care, we have created designated Provider Care Teams.  These Care Teams include your primary Cardiologist (physician) and Advanced Practice Providers (APPs -  Physician Assistants and Nurse Practitioners) who all work together to provide you with the care you need, when you need it.  We recommend signing up for the patient portal called "MyChart".  Sign up information is provided on this After Visit Summary.  MyChart is used to connect with patients for Virtual Visits (Telemedicine).  Patients are able to view lab/test results, encounter notes, upcoming appointments, etc.  Non-urgent messages can be sent to your provider as well.   To learn more about what you can do with MyChart, go to [email or web address].    Your next appointment:   6 month(s)  The format for your next appointment:   In Person  Provider:   Tonny Bollman, MD     Important Information About Sugar

## 2021-09-23 LAB — BASIC METABOLIC PANEL
BUN/Creatinine Ratio: 12 (ref 10–24)
BUN: 17 mg/dL (ref 8–27)
CO2: 23 mmol/L (ref 20–29)
Calcium: 9.5 mg/dL (ref 8.6–10.2)
Chloride: 103 mmol/L (ref 96–106)
Creatinine, Ser: 1.38 mg/dL — ABNORMAL HIGH (ref 0.76–1.27)
Glucose: 155 mg/dL — ABNORMAL HIGH (ref 70–99)
Potassium: 4 mmol/L (ref 3.5–5.2)
Sodium: 140 mmol/L (ref 134–144)
eGFR: 56 mL/min/{1.73_m2} — ABNORMAL LOW (ref >59)

## 2021-10-02 ENCOUNTER — Other Ambulatory Visit: Payer: Self-pay | Admitting: Family Medicine

## 2021-10-04 ENCOUNTER — Other Ambulatory Visit: Payer: Self-pay

## 2021-10-04 MED ORDER — HYDROCHLOROTHIAZIDE 25 MG PO TABS
25.0000 mg | ORAL_TABLET | Freq: Every day | ORAL | 0 refills | Status: DC
Start: 2021-10-04 — End: 2022-04-11
  Filled 2021-10-04: qty 90, 90d supply, fill #0

## 2021-10-18 ENCOUNTER — Other Ambulatory Visit: Payer: Self-pay | Admitting: Family Medicine

## 2021-10-18 DIAGNOSIS — E1159 Type 2 diabetes mellitus with other circulatory complications: Secondary | ICD-10-CM

## 2021-10-20 NOTE — Telephone Encounter (Signed)
Requested medication (s) are due for refill today: Yes  Requested medication (s) are on the active medication list: Yes  Last refill:  08/17/21  Future visit scheduled: Yes  Notes to clinic:  Historical provider.    Requested Prescriptions  Pending Prescriptions Disp Refills   glipiZIDE (GLUCOTROL XL) 10 MG 24 hr tablet [Pharmacy Med Name: GLIPIZIDE ER 10 MG TABLET] 90 tablet     Sig: TAKE 1 TABLET (10 MG TOTAL) BY MOUTH DAILY WITH BREAKFAST.     Endocrinology:  Diabetes - Sulfonylureas Failed - 10/18/2021  5:46 PM      Failed - Cr in normal range and within 360 days    Creatinine, Ser  Date Value Ref Range Status  09/22/2021 1.38 (H) 0.76 - 1.27 mg/dL Final         Passed - HBA1C is between 0 and 7.9 and within 180 days    Hemoglobin A1C  Date Value Ref Range Status  08/17/2021 5.0 4.0 - 5.6 % Final   Hgb A1c MFr Bld  Date Value Ref Range Status  08/02/2020 6.3 (H) 4.8 - 5.6 % Final    Comment:             Prediabetes: 5.7 - 6.4          Diabetes: >6.4          Glycemic control for adults with diabetes: <7.0          Passed - Valid encounter within last 6 months    Recent Outpatient Visits           2 months ago Type 2 diabetes mellitus with other circulatory complication, without long-term current use of insulin (HCC)   Primary Care at [facility], MD   5 months ago Type 2 diabetes mellitus with other circulatory complication, without long-term current use of insulin [facility])   Primary Care at [facility], Lauris Poag, MD   2 years ago Type 2 diabetes mellitus with other circulatory complication, without long-term current use of insulin [facility])   Primary Care at [facility], Kandee Keen, MD   2 years ago    Primary Care at [facility]   2 years ago Encounter to establish care   Primary Care at [facility], Binnie Rail, MD       Future Appointments             In 3 months Georganna Skeans, MD Primary Care at  [facility]   In 5 months Tonny Bollman, MD [facility], [facility]

## 2021-10-21 ENCOUNTER — Other Ambulatory Visit: Payer: Self-pay

## 2021-10-23 ENCOUNTER — Other Ambulatory Visit: Payer: Self-pay | Admitting: Cardiology

## 2021-11-13 ENCOUNTER — Other Ambulatory Visit: Payer: Self-pay | Admitting: Family Medicine

## 2021-11-13 DIAGNOSIS — E1159 Type 2 diabetes mellitus with other circulatory complications: Secondary | ICD-10-CM

## 2021-11-14 MED ORDER — GLIPIZIDE ER 10 MG PO TB24: 10.0000 mg | ORAL_TABLET | Freq: Every day | ORAL | 0 refills | Status: DC

## 2022-02-08 ENCOUNTER — Other Ambulatory Visit: Payer: Self-pay | Admitting: Family Medicine

## 2022-02-08 NOTE — Telephone Encounter (Signed)
Requested Prescriptions  Pending Prescriptions Disp Refills   metFORMIN (GLUCOPHAGE) 500 MG tablet [Pharmacy Med Name: METFORMIN HCL 500 MG TABLET] 60 tablet 0    Sig: TAKE 1 TABLET BY MOUTH 2 TIMES DAILY WITH A MEAL.     Endocrinology:  Diabetes - Biguanides Failed - 02/08/2022  2:00 AM      Failed - Cr in normal range and within 360 days    Creatinine, Ser  Date Value Ref Range Status  09/22/2021 1.38 (H) 0.76 - 1.27 mg/dL Final         Failed - eGFR in normal range and within 360 days    GFR calc Af Amer  Date Value Ref Range Status  06/18/2019 88 >59 mL/min/1.73 Final   GFR calc non Af Amer  Date Value Ref Range Status  06/18/2019 76 >59 mL/min/1.73 Final   eGFR  Date Value Ref Range Status  09/22/2021 56 (L) >59 mL/min/1.73 Final         Failed - B12 Level in normal range and within 720 days    No results found for: "VITAMINB12"       Failed - CBC within normal limits and completed in the last 12 months    WBC  Date Value Ref Range Status  08/02/2020 5.2 3.4 - 10.8 x10E3/uL Final  02/06/2019 9.6 4.0 - 10.5 K/uL Final   RBC  Date Value Ref Range Status  08/02/2020 4.52 4.14 - 5.80 x10E6/uL Final  02/06/2019 4.35 4.22 - 5.81 MIL/uL Final   Hemoglobin  Date Value Ref Range Status  08/02/2020 12.6 (L) 13.0 - 17.7 g/dL Final   Hematocrit  Date Value Ref Range Status  08/02/2020 39.4 37.5 - 51.0 % Final   MCHC  Date Value Ref Range Status  08/02/2020 32.0 31.5 - 35.7 g/dL Final  02/06/2019 31.7 30.0 - 36.0 g/dL Final   [facility]  Date Value Ref Range Status  08/02/2020 27.9 26.6 - 33.0 pg Final  02/06/2019 28.0 26.0 - 34.0 pg Final   MCV  Date Value Ref Range Status  08/02/2020 87 79 - 97 fL Final   No results found for: "PLTCOUNTKUC", "LABPLAT", "POCPLA" RDW  Date Value Ref Range Status  08/02/2020 11.2 (L) 11.6 - 15.4 % Final         Passed - HBA1C is between 0 and 7.9 and within 180 days    Hemoglobin A1C  Date Value Ref Range Status  08/17/2021  5.0 4.0 - 5.6 % Final   Hgb A1c MFr Bld  Date Value Ref Range Status  08/02/2020 6.3 (H) 4.8 - 5.6 % Final    Comment:             Prediabetes: 5.7 - 6.4          Diabetes: >6.4          Glycemic control for adults with diabetes: <7.0          Passed - Valid encounter within last 6 months    Recent Outpatient Visits           5 months ago Type 2 diabetes mellitus with other circulatory complication, without long-term current use of insulin ([place])   Primary Care at [facility], MD   9 months ago Type 2 diabetes mellitus with other circulatory complication, without long-term current use of insulin [facility])   Primary Care at [facility], Clyde Canterbury, MD   2 years ago Type 2 diabetes mellitus with other circulatory complication, without long-term  current use of insulin [facility])   Primary Care at [facility], Bayard Beaver, MD   2 years ago    Primary Care at [facility]   2 years ago Encounter to establish care   Primary Care at [facility], Dalbert Batman, MD       Future Appointments             In 5 days Dorna Mai, MD Primary Care at [facility]   In 1 month Sherren Mocha, MD [place]. [facility], [facility]

## 2022-02-13 ENCOUNTER — Encounter: Payer: Self-pay | Admitting: Family Medicine

## 2022-02-13 ENCOUNTER — Ambulatory Visit (INDEPENDENT_AMBULATORY_CARE_PROVIDER_SITE_OTHER): Payer: BC Managed Care – PPO | Admitting: Family Medicine

## 2022-02-13 VITALS — BP 128/78 | HR 57 | Temp 98.1°F | Resp 16 | Wt 226.2 lb

## 2022-02-13 DIAGNOSIS — Z0000 Encounter for general adult medical examination without abnormal findings: Secondary | ICD-10-CM

## 2022-02-13 DIAGNOSIS — Z23 Encounter for immunization: Secondary | ICD-10-CM

## 2022-02-13 DIAGNOSIS — E1159 Type 2 diabetes mellitus with other circulatory complications: Secondary | ICD-10-CM | POA: Diagnosis not present

## 2022-02-13 LAB — POCT GLYCOSYLATED HEMOGLOBIN (HGB A1C): Hemoglobin A1C: 5.5 % (ref 4.0–5.6)

## 2022-02-13 NOTE — Progress Notes (Deleted)
Patient is here for their 6 month follow-up Patient has no concerns today Care gaps have been discussed with patient    Subjective:   Adam [name] is a 66 y.o. male who presents for Medicare Annual/Subsequent preventive examination.  Review of Systems    ***       Objective:    There were no vitals filed for this visit. There is no height or weight on file to calculate BMI.     02/06/2019   10:13 AM  Advanced Directives  Does Patient Have a Medical Advance Directive? No  Would patient like information on creating a medical advance directive? No - Patient declined    Current Medications (verified) Outpatient Encounter Medications as of 02/13/2022  Medication Sig  . aspirin EC 81 MG EC tablet Take 1 tablet (81 mg total) by mouth daily.  Marland Kitchen atorvastatin (LIPITOR) 40 MG tablet TAKE 1 TABLET BY MOUTH DAILY AT 6 PM.  . Blood Glucose Monitoring Suppl (ONE TOUCH ULTRA 2) w/Device KIT Use to check FSBS fasting daily. Dx: E11.59  . Blood Pressure KIT Check blood pressure once to twice daily. Notify PCP if readings greater than 160/100. ICD10 code-I10 dispense brand covered by health insurance plan  . clopidogrel (PLAVIX) 75 MG tablet Take 1 tablet (75 mg total) by mouth daily.  Marland Kitchen glipiZIDE (GLUCOTROL XL) 10 MG 24 hr tablet Take 1 tablet (10 mg total) by mouth daily with breakfast.  . glucose blood (ONETOUCH ULTRA) test strip USE TO TEST BLOOD SUGAR BID (TWO TIMES A DAY)  . hydrochlorothiazide (HYDRODIURIL) 25 MG tablet Take 1 tablet (25 mg total) by mouth daily.  Elmore Guise Devices (ONE TOUCH DELICA LANCING DEV) MISC Use to check FSBS fasting daily. Dx: E11.59  . Lancets Misc. (ACCU-CHEK FASTCLIX LANCET) KIT Use to check FSBS fasting daily. Dx: E11.59  . metFORMIN (GLUCOPHAGE) 500 MG tablet TAKE 1 TABLET BY MOUTH 2 TIMES DAILY WITH A MEAL.  . metoprolol tartrate (LOPRESSOR) 25 MG tablet Take 0.5 tablets (12.5 mg total) by mouth 2 (two) times daily.  . Multiple Vitamins-Minerals (MULTI  FOR HIM PO) Take by mouth. gummies with omega 3  . nitroGLYCERIN (NITROSTAT) 0.4 MG SL tablet PLACE 1 TABLET UNDER THE TONUGE EVERY 5 MINUTES AS NEEDED FOR CHEST PAIN  . OneTouch Delica Lancets 32D MISC Use to check FSBS fasting daily. Dx: E11.59  . potassium chloride (KLOR-CON M10) 10 MEQ tablet Take by mouth.   No facility-administered encounter medications on file as of 02/13/2022.    Allergies (verified) Patient has no known allergies.   History: Past Medical History:  Diagnosis Date  . Coronary artery disease   . Diabetes mellitus without complication ([place])   . Myocardial infarction ([place])    PCI/DES to pLCx, PTCA to PDA, small nondominent RCA with 95%  . Occasional recreational drug use    Past Surgical History:  Procedure Laterality Date  . CORONARY BALLOON ANGIOPLASTY N/A 02/05/2019   Procedure: CORONARY BALLOON ANGIOPLASTY;  Surgeon: Leonie Man, MD;  Location: [place] CV LAB;  Service: Cardiovascular;  Laterality: N/A;  . CORONARY STENT INTERVENTION N/A 02/05/2019   Procedure: CORONARY STENT INTERVENTION;  Surgeon: Leonie Man, MD;  Location: [place] CV LAB;  Service: Cardiovascular;  Laterality: N/A;  . CORONARY THROMBECTOMY N/A 02/05/2019   Procedure: Coronary Thrombectomy;  Surgeon: Leonie Man, MD;  Location: [place] CV LAB;  Service: Cardiovascular;  Laterality: N/A;  . LEFT HEART CATH AND CORONARY ANGIOGRAPHY N/A 02/05/2019  Procedure: LEFT HEART CATH AND CORONARY ANGIOGRAPHY;  Surgeon: Leonie Man, MD;  Location: [place] CV LAB;  Service: Cardiovascular;  Laterality: N/A;   Family History  Problem Relation Age of Onset  . Heart disease Mother   . Diabetes Father   . Breast cancer Sister   . Multiple sclerosis Sister   . Stomach cancer Sister   . Healthy Neg Hx    Social History   Socioeconomic History  . Marital status: Single    Spouse name: Not on file  . Number of children: 12  . Years of education: 63  . Highest  education level: Not on file  Occupational History  . Not on file  Tobacco Use  . Smoking status: Never  . Smokeless tobacco: Never  Vaping Use  . Vaping Use: Never used  Substance and Sexual Activity  . Alcohol use: Yes    Comment: SOCIAL  . Drug use: Not Currently    Types: Cocaine  . Sexual activity: Not on file  Other Topics Concern  . Not on file  Social History Narrative  . Not on file   Social Determinants of Health   Financial Resource Strain: Not on file  Food Insecurity: Not on file  Transportation Needs: Not on file  Physical Activity: Not on file  Stress: Not on file  Social Connections: Not on file    Tobacco Counseling Counseling given: Not Answered   Clinical Intake:  Pre-visit preparation completed: No  Pain : No/denies pain     Diabetes: Yes CBG done?: No Did pt. bring in CBG monitor from home?: No     Diabetic?***  Interpreter Needed?: No      Activities of Daily Living     No data to display           Patient Care Team: Dorna Mai, MD as PCP - General (Family Medicine) Sherren Mocha, MD as PCP - Cardiology (Cardiology)  Indicate any recent Medical Services you may have received from other than [facility] providers in the past year (date may be approximate).     Assessment:   This is a routine wellness examination for Adam [name].  Hearing/Vision screen No results found.  Dietary issues and exercise activities discussed:     Goals Addressed   None   Depression Screen    02/13/2022    9:52 AM 08/17/2021   11:09 AM 04/28/2021    9:28 AM 06/13/2019   11:24 AM 04/15/2019   11:22 AM  PHQ 2/9 Scores  PHQ - 2 Score 0 0 0 0 0  PHQ- 9 Score 0 0 0 0     Fall Risk    04/28/2021    9:29 AM 06/13/2019   11:22 AM  [place] in the past year? 0 0  Number falls in past yr: 0   Injury with Fall? 0     FALL RISK PREVENTION PERTAINING TO THE HOME:  Any stairs in or around the home? {YES/NO:21197} If so, are there  any without handrails? {YES/NO:21197} Home free of loose throw rugs in walkways, pet beds, electrical cords, etc? {YES/NO:21197} Adequate lighting in your home to reduce risk of falls? {YES/NO:21197}  ASSISTIVE DEVICES UTILIZED TO PREVENT FALLS:  Life alert? {YES/NO:21197} Use of a cane, walker or w/c? {YES/NO:21197} Grab bars in the bathroom? {YES/NO:21197} Shower chair or bench in shower? {YES/NO:21197} Elevated toilet seat or a handicapped toilet? {YES/NO:21197}  TIMED UP AND GO:  Was the test performed? {YES/NO:21197}.  Length of time to ambulate 10 feet: *** sec.   {Appearance of WRUE:4540981}  Cognitive Function:        Immunizations Immunization History  Administered Date(s) Administered  . Fluad Quad(high Dose 65+) 04/28/2021  . Influenza,inj,Quad PF,6+ Mos 02/06/2019  . Moderna SARS-COV2 Booster Vaccination 01/23/2020  . Moderna Sars-Covid-2 Vaccination 05/05/2019, 06/05/2019, 10/17/2019, 01/23/2020    {TDAP status:2101805}  {Flu Vaccine status:2101806}  {Pneumococcal vaccine status:2101807}  {Covid-19 vaccine status:2101808}  Qualifies for Shingles Vaccine? {YES/NO:21197}  Zostavax completed {YES/NO:21197}  {Shingrix Completed?:2101804}  Screening Tests Health Maintenance  Topic Date Due  . Medicare Annual Wellness (AWV)  Never done  . Hepatitis C Screening  Never done  . COVID-19 Vaccine (5 - Moderna series) 03/19/2020  . INFLUENZA VACCINE  10/25/2021  . Pneumonia Vaccine 41+ Years old (1 - PCV) 03/26/2022 (Originally 12/15/2019)  . Zoster Vaccines- Shingrix (1 of 2) 03/26/2022 (Originally 12/14/2004)  . OPHTHALMOLOGY EXAM  10/20/2022 (Originally [birth year]14/1965)  . COLONOSCOPY (Pts 45-72yr Insurance coverage will need to be confirmed)  10/20/2022 (Originally 12/15/1999)  . HEMOGLOBIN A1C  02/17/2022  . Diabetic kidney evaluation - Urine ACR  04/28/2022  . FOOT EXAM  05/03/2022  . Diabetic kidney evaluation - GFR measurement  09/23/2022  . HPV VACCINES   Aged Out    Health Maintenance  Health Maintenance Due  Topic Date Due  . Medicare Annual Wellness (AWV)  Never done  . Hepatitis C Screening  Never done  . COVID-19 Vaccine (5 - Moderna series) 03/19/2020  . INFLUENZA VACCINE  10/25/2021    {Colorectal cancer screening:2101809}  Lung Cancer Screening: (Low Dose CT Chest recommended if Age 67-80years, 30 pack-year currently smoking OR have quit w/in 15years.) {DOES NOT does:27190::"does not"} qualify.   Lung Cancer Screening Referral: ***  Additional Screening:  Hepatitis C Screening: {DOES NOT does:27190::"does not"} qualify; Completed ***  Vision Screening: Recommended annual ophthalmology exams for early detection of glaucoma and other disorders of the eye. Is the patient up to date with their annual eye exam?  {YES/NO:21197} Who is the provider or what is the name of the office in which the patient attends annual eye exams? *** If pt is not established with a provider, would they like to be referred to a provider to establish care? {YES/NO:21197}.   Dental Screening: Recommended annual dental exams for proper oral hygiene  Community Resource Referral / Chronic Care Management: CRR required this visit?  {YES/NO:21197}  CCM required this visit?  {YES/NO:21197}     Plan:     I have personally reviewed and noted the following in the patient's chart:   Medical and social history Use of alcohol, tobacco or illicit drugs  Current medications and supplements including opioid prescriptions. {Opioid Prescriptions:[phone]} Functional ability and status Nutritional status Physical activity Advanced directives List of other physicians Hospitalizations, surgeries, and ER visits in previous 12 months Vitals Screenings to include cognitive, depression, and falls Referrals and appointments  In addition, I have reviewed and discussed with patient certain preventive protocols, quality metrics, and best practice  recommendations. A written personalized care plan for preventive services as well as general preventive health recommendations were provided to patient.     AMelene Plan RMA   02/13/2022   Nurse Notes: ***

## 2022-02-14 ENCOUNTER — Encounter: Payer: Self-pay | Admitting: Family Medicine

## 2022-02-14 NOTE — Progress Notes (Signed)
 Established Patient Office Visit  Subjective    Patient ID: Adam [name], male    DOB: 04/30/1955  Age: 66 y.o. MRN: [id number]  CC: No chief complaint on file.   HPI Adam [name] presents for routine follow up of diabetes. Patient denies acute complaints or concerns.    Outpatient Encounter Medications as of 02/13/2022  Medication Sig   aspirin EC 81 MG EC tablet Take 1 tablet (81 mg total) by mouth daily.   atorvastatin (LIPITOR) 40 MG tablet TAKE 1 TABLET BY MOUTH DAILY AT 6 PM.   Blood Glucose Monitoring Suppl (ONE TOUCH ULTRA 2) w/Device KIT Use to check FSBS fasting daily. Dx: E11.59   Blood Pressure KIT Check blood pressure once to twice daily. Notify PCP if readings greater than 160/100. ICD10 code-I10 dispense brand covered by health insurance plan   clopidogrel (PLAVIX) 75 MG tablet Take 1 tablet (75 mg total) by mouth daily.   glipiZIDE (GLUCOTROL XL) 10 MG 24 hr tablet Take 1 tablet (10 mg total) by mouth daily with breakfast.   glucose blood (ONETOUCH ULTRA) test strip USE TO TEST BLOOD SUGAR BID (TWO TIMES A DAY)   hydrochlorothiazide (HYDRODIURIL) 25 MG tablet Take 1 tablet (25 mg total) by mouth daily.   Lancet Devices (ONE TOUCH DELICA LANCING DEV) MISC Use to check FSBS fasting daily. Dx: E11.59   Lancets Misc. (ACCU-CHEK FASTCLIX LANCET) KIT Use to check FSBS fasting daily. Dx: E11.59   metFORMIN (GLUCOPHAGE) 500 MG tablet TAKE 1 TABLET BY MOUTH 2 TIMES DAILY WITH A MEAL.   metoprolol tartrate (LOPRESSOR) 25 MG tablet Take 0.5 tablets (12.5 mg total) by mouth 2 (two) times daily.   Multiple Vitamins-Minerals (MULTI FOR HIM PO) Take by mouth. gummies with omega 3   nitroGLYCERIN (NITROSTAT) 0.4 MG SL tablet PLACE 1 TABLET UNDER THE TONUGE EVERY 5 MINUTES AS NEEDED FOR CHEST PAIN   OneTouch Delica Lancets 33G MISC Use to check FSBS fasting daily. Dx: E11.59   potassium chloride (KLOR-CON M10) 10 MEQ tablet Take by mouth.   No facility-administered encounter  medications on file as of 02/13/2022.    Past Medical History:  Diagnosis Date   Coronary artery disease    Diabetes mellitus without complication (HCC)    Myocardial infarction (HCC)    PCI/DES to pLCx, PTCA to PDA, small nondominent RCA with 95%   Occasional recreational drug use     Past Surgical History:  Procedure Laterality Date   CORONARY BALLOON ANGIOPLASTY N/A 02/05/2019   Procedure: CORONARY BALLOON ANGIOPLASTY;  Surgeon: Harding, David W, MD;  [facility];  Service: Cardiovascular;  Laterality: N/A;   CORONARY STENT INTERVENTION N/A 02/05/2019   Procedure: CORONARY STENT INTERVENTION;  Surgeon: Harding, David W, MD;  Location: [facility];  Service: Cardiovascular;  Laterality: N/A;   CORONARY THROMBECTOMY N/A 02/05/2019   Procedure: Coronary Thrombectomy;  Surgeon: Harding, David W, MD;  Location: [facility];  Service: Cardiovascular;  Laterality: N/A;   LEFT HEART CATH AND CORONARY ANGIOGRAPHY N/A 02/05/2019   Procedure: LEFT HEART CATH AND CORONARY ANGIOGRAPHY;  Surgeon: Harding, David W, MD;  Location: [facility];  Service: Cardiovascular;  Laterality: N/A;    Family History  Problem Relation Age of Onset   Heart disease Mother    Diabetes Father    Breast cancer Sister    Multiple sclerosis Sister    Stomach cancer Sister    Healthy Neg Hx       Social History   Socioeconomic History   Marital status: Single    Spouse name: Not on file   Number of children: 12   Years of education: 12   Highest education level: Not on file  Occupational History   Not on file  Tobacco Use   Smoking status: Never   Smokeless tobacco: Never  Vaping Use   Vaping Use: Never used  Substance and Sexual Activity   Alcohol use: Yes    Comment: SOCIAL   Drug use: Not Currently    Types: Cocaine   Sexual activity: Not on file  Other Topics Concern   Not on file  Social History Narrative   Not on file   Social Determinants of Health    Financial Resource Strain: Not on file  Food Insecurity: Not on file  Transportation Needs: Not on file  Physical Activity: Not on file  Stress: Not on file  Social Connections: Not on file  Intimate Partner Violence: Not on file    Review of Systems  All other systems reviewed and are negative.       Objective    BP 128/78   Pulse (!) 57   Temp 98.1 F (36.7 C) (Oral)   Resp 16   Wt 226 lb 3.2 oz (102.6 kg)   SpO2 96%   BMI 27.53 kg/m   Physical Exam Vitals and nursing note reviewed.  Constitutional:      General: He is not in acute distress. Cardiovascular:     Rate and Rhythm: Normal rate and regular rhythm.  Pulmonary:     Effort: Pulmonary effort is normal.     Breath sounds: Normal breath sounds.  Abdominal:     Palpations: Abdomen is soft.     Tenderness: There is no abdominal tenderness.  Musculoskeletal:     Right lower leg: No edema.     Left lower leg: No edema.  Neurological:     General: No focal deficit present.     Mental Status: He is alert and oriented to person, place, and time.         Assessment & Plan:   1. Type 2 diabetes mellitus with other circulatory complication, without long-term current use of insulin (HCC) A1c is at goal. Continue and monitor - POCT glycosylated hemoglobin (Hb A1C) - Basic Metabolic Panel - AST - ALT - Lipid Panel  2. Encounter for Medicare annual wellness exam   3. Need for immunization against influenza  - Flu Vaccine QUAD High Dose(Fluad)    Return in 6 months (on 08/14/2022) for follow up.   Wilson, Amelia P, MD   

## 2022-02-17 LAB — BASIC METABOLIC PANEL
BUN/Creatinine Ratio: 15 (ref 10–24)
BUN: 23 mg/dL (ref 8–27)
CO2: 24 mmol/L (ref 20–29)
Calcium: 9.7 mg/dL (ref 8.6–10.2)
Chloride: 103 mmol/L (ref 96–106)
Creatinine, Ser: 1.51 mg/dL — ABNORMAL HIGH (ref 0.76–1.27)
Glucose: 156 mg/dL — ABNORMAL HIGH (ref 70–99)
Potassium: 4.3 mmol/L (ref 3.5–5.2)
Sodium: 140 mmol/L (ref 134–144)
eGFR: 50 mL/min/{1.73_m2} — ABNORMAL LOW (ref >59)

## 2022-02-17 LAB — AST: AST: 15 IU/L (ref 0–40)

## 2022-02-17 LAB — LIPID PANEL
Chol/HDL Ratio: 2.8 ratio (ref 0.0–5.0)
Cholesterol, Total: 110 mg/dL (ref 100–199)
HDL: 39 mg/dL — ABNORMAL LOW (ref >39)
LDL Chol Calc (NIH): 56 mg/dL (ref 0–99)
Triglycerides: 69 mg/dL (ref 0–149)
VLDL Cholesterol Cal: 15 mg/dL (ref 5–40)

## 2022-02-17 LAB — SPECIMEN STATUS REPORT

## 2022-02-17 LAB — ALT: ALT: 17 IU/L (ref 0–44)

## 2022-03-04 ENCOUNTER — Other Ambulatory Visit: Payer: Self-pay | Admitting: Family Medicine

## 2022-03-13 ENCOUNTER — Ambulatory Visit: Payer: Self-pay

## 2022-03-13 ENCOUNTER — Ambulatory Visit: Payer: BC Managed Care – PPO | Attending: Nurse Practitioner

## 2022-03-13 DIAGNOSIS — I5022 Chronic systolic (congestive) heart failure: Secondary | ICD-10-CM

## 2022-03-13 DIAGNOSIS — I10 Essential (primary) hypertension: Secondary | ICD-10-CM

## 2022-03-13 DIAGNOSIS — E785 Hyperlipidemia, unspecified: Secondary | ICD-10-CM

## 2022-03-13 DIAGNOSIS — I2510 Atherosclerotic heart disease of native coronary artery without angina pectoris: Secondary | ICD-10-CM

## 2022-03-13 LAB — COMPREHENSIVE METABOLIC PANEL
ALT: 15 IU/L (ref 0–44)
AST: 12 IU/L (ref 0–40)
Albumin/Globulin Ratio: 1.7 (ref 1.2–2.2)
Albumin: 4.4 g/dL (ref 3.9–4.9)
Alkaline Phosphatase: 77 IU/L (ref 44–121)
BUN/Creatinine Ratio: 11 (ref 10–24)
BUN: 18 mg/dL (ref 8–27)
Bilirubin Total: 1 mg/dL (ref 0.0–1.2)
CO2: 24 mmol/L (ref 20–29)
Calcium: 9.7 mg/dL (ref 8.6–10.2)
Chloride: 104 mmol/L (ref 96–106)
Creatinine, Ser: 1.58 mg/dL — ABNORMAL HIGH (ref 0.76–1.27)
Globulin, Total: 2.6 g/dL (ref 1.5–4.5)
Glucose: 138 mg/dL — ABNORMAL HIGH (ref 70–99)
Potassium: 4.5 mmol/L (ref 3.5–5.2)
Sodium: 141 mmol/L (ref 134–144)
Total Protein: 7 g/dL (ref 6.0–8.5)
eGFR: 48 mL/min/{1.73_m2} — ABNORMAL LOW (ref >59)

## 2022-03-13 LAB — LIPID PANEL
Chol/HDL Ratio: 2.6 ratio (ref 0.0–5.0)
Cholesterol, Total: 111 mg/dL (ref 100–199)
HDL: 42 mg/dL (ref >39)
LDL Chol Calc (NIH): 54 mg/dL (ref 0–99)
Triglycerides: 74 mg/dL (ref 0–149)
VLDL Cholesterol Cal: 15 mg/dL (ref 5–40)

## 2022-03-13 NOTE — Telephone Encounter (Signed)
  Chief Complaint: vision change  Symptoms: seeing black spots in both eyes with or w/o glasses on  Frequency: 1 hr ago, suddenly  Pertinent Negatives: Patient denies HA, dizziness, or eye pain  Disposition: [x] ED /[] Urgent Care (no appt availability in office) / [] Appointment(In office/virtual)/ []  [place] [facility]/ [] Home Care/ [] Refused Recommended Disposition /[] [place] [facility]/ []  Follow-up with PCP Additional Notes: advised pt to go to ED and be seen. Spoke with pt and Fiance. Both verbalized understanding.   Reason for Disposition  [1] Blurred vision or visual changes AND [2] present now AND [3] sudden onset or new (e.g., minutes, hours, days)  (Exception: Seeing floaters / black specks OR previously diagnosed migraine headaches with same symptoms.)  Answer Assessment - Initial Assessment Questions 1. DESCRIPTION: "How has your vision changed?" (e.g., complete vision loss, blurred vision, double vision, floaters, etc.)     Black spots  2. LOCATION: "One or both eyes?" If one, ask: "Which eye?"     Both  3. SEVERITY: "Can you see anything?" If Yes, ask: "What can you see?" (e.g., fine print)     yes 4. ONSET: "When did this begin?" "Did it start suddenly or has this been gradual?"     1 hr ago  5. PATTERN: "Does this come and go, or has it been constant since it started?"     Constant  6. PAIN: "Is there any pain in your eye(s)?"  (Scale 1-10; or mild, moderate, severe)   - NONE (0): No pain.   - MILD (1-3): Doesn't interfere with normal activities.   - MODERATE (4-7): Interferes with normal activities or awakens from sleep.    - SEVERE (8-10): Excruciating pain, unable to do any normal activities.     no 7. CONTACTS-GLASSES: "Do you wear contacts or glasses?"     Glasses  9. OTHER SYMPTOMS: "Do you have any other symptoms?" (e.g., confusion, headache, arm or leg weakness, speech problems)     No  Protocols used: Vision Loss or Change-A-AH

## 2022-03-14 NOTE — Progress Notes (Signed)
Pt has been made aware of normal result and verbalized understanding.  jw

## 2022-03-24 ENCOUNTER — Ambulatory Visit: Payer: BC Managed Care – PPO | Attending: Cardiovascular Disease | Admitting: Cardiovascular Disease

## 2022-03-24 ENCOUNTER — Encounter: Payer: Self-pay | Admitting: Cardiovascular Disease

## 2022-03-24 VITALS — BP 122/78 | HR 64 | Ht 76.0 in | Wt 224.0 lb

## 2022-03-24 DIAGNOSIS — I2510 Atherosclerotic heart disease of native coronary artery without angina pectoris: Secondary | ICD-10-CM

## 2022-03-24 DIAGNOSIS — I10 Essential (primary) hypertension: Secondary | ICD-10-CM

## 2022-03-24 DIAGNOSIS — E785 Hyperlipidemia, unspecified: Secondary | ICD-10-CM

## 2022-03-24 DIAGNOSIS — I5032 Chronic diastolic (congestive) heart failure: Secondary | ICD-10-CM

## 2022-03-24 NOTE — Progress Notes (Signed)
Cardiology Office Note:    Date:  03/24/2022   ID:  Adam [name], DOB 1955-06-01, MRN [id number]  PCP:  Adam [name], [place] [facility] Cardiologist:  Adam Mocha, MD     Referring MD: Adam Mai, MD   Chief Complaint  Patient presents with   Coronary Artery Disease    History of Present Illness:    Adam [name] is a 66 y.o. male with a hx of coronary artery disease, presenting for follow-up evaluation.  He was last seen by Adam [name]'s 1 year in June 2023.  He initially presented with his coronary artery disease with a non-STEMI in 2020 with a high-sensitivity troponin peaking at greater than 27,000.  He was found to have multivessel coronary artery disease and he underwent stenting of the critical stenosis in the left circumflex which was felt to be his culprit lesion.  LVEF at that time was 50 to 55% with basal and mid inferolateral hypokinesis.  The patient is doing well at this time.  He denies chest pain, chest pressure, shortness of breath, leg swelling, orthopnea, or PND.  Has had no heart palpitations, lightheadedness, or syncope.  He is compliant with his medications.  He denies bleeding problems on aspirin and clopidogrel.  He reports good glycemic control on his diabetic medication with a most recent hemoglobin A1c of 5.5.  Past Medical History:  Diagnosis Date   Coronary artery disease    Diabetes mellitus without complication ([place])    Myocardial infarction ([place])    PCI/DES to pLCx, PTCA to PDA, small nondominent RCA with 95%   Occasional recreational drug use     Past Surgical History:  Procedure Laterality Date   CORONARY BALLOON ANGIOPLASTY N/A 02/05/2019   Procedure: CORONARY BALLOON ANGIOPLASTY;  Surgeon: Adam Man, MD;  Location: [facility];  Service: Cardiovascular;  Laterality: N/A;   CORONARY STENT INTERVENTION N/A 02/05/2019   Procedure: CORONARY STENT INTERVENTION;  Surgeon: Adam Man, MD;  Location:  [facility];  Service: Cardiovascular;  Laterality: N/A;   CORONARY THROMBECTOMY N/A 02/05/2019   Procedure: Coronary Thrombectomy;  Surgeon: Adam Man, MD;  Location: [facility];  Service: Cardiovascular;  Laterality: N/A;   LEFT HEART CATH AND CORONARY ANGIOGRAPHY N/A 02/05/2019   Procedure: LEFT HEART CATH AND CORONARY ANGIOGRAPHY;  Surgeon: Adam Man, MD;  Location: [facility];  Service: Cardiovascular;  Laterality: N/A;    Current Medications: Current Meds  Medication Sig   aspirin EC 81 MG EC tablet Take 1 tablet (81 mg total) by mouth daily.   atorvastatin (LIPITOR) 40 MG tablet TAKE 1 TABLET BY MOUTH DAILY AT 6 PM.   Blood Glucose Monitoring Suppl (ONE TOUCH ULTRA 2) w/Device KIT Use to [name] FSBS fasting daily. Dx: E11.59   Blood Pressure KIT [name] blood pressure once to twice daily. Notify PCP if readings greater than 160/100. ICD10 code-I10 dispense brand covered by health insurance plan   clopidogrel (PLAVIX) 75 MG tablet Take 1 tablet (75 mg total) by mouth daily.   glipiZIDE (GLUCOTROL XL) 10 MG 24 hr tablet Take 1 tablet (10 mg total) by mouth daily with breakfast.   glucose blood (ONETOUCH ULTRA) test strip USE TO TEST BLOOD SUGAR BID (TWO TIMES A DAY)   hydrochlorothiazide (HYDRODIURIL) 25 MG tablet Take 1 tablet (25 mg total) by mouth daily.   Lancet Devices (ONE TOUCH DELICA LANCING DEV) MISC Use to [name] FSBS fasting daily. Dx:  E11.59   Lancets Misc. (ACCU-CHEK FASTCLIX LANCET) KIT Use to [name] FSBS fasting daily. Dx: E11.59   metFORMIN (GLUCOPHAGE) 500 MG tablet TAKE 1 TABLET BY MOUTH TWICE A DAY WITH FOOD   metoprolol tartrate (LOPRESSOR) 25 MG tablet Take 0.5 tablets (12.5 mg total) by mouth 2 (two) times daily.   Multiple Vitamins-Minerals (MULTI FOR HIM PO) Take by mouth. gummies with omega 3   nitroGLYCERIN (NITROSTAT) 0.4 MG SL tablet PLACE 1 TABLET UNDER THE TONUGE EVERY 5 MINUTES AS NEEDED FOR CHEST PAIN   OneTouch Delica  Lancets 11M MISC Use to [name] FSBS fasting daily. Dx: E11.59   potassium chloride (KLOR-CON M10) 10 MEQ tablet Take by mouth.     Allergies:   Patient has no known allergies.   Social History   Socioeconomic History   Marital status: Single    Spouse name: Not on file   Number of children: 12   Years of education: 12   Highest education level: Not on file  Occupational History   Not on file  Tobacco Use   Smoking status: Never   Smokeless tobacco: Never  Vaping Use   Vaping Use: Never used  Substance and Sexual Activity   Alcohol use: Yes    Comment: SOCIAL   Drug use: Not Currently    Types: Cocaine   Sexual activity: Not on file  Other Topics Concern   Not on file  Social History Narrative   Not on file   Social Determinants of Health   Financial Resource Strain: Not on file  Food Insecurity: Not on file  Transportation Needs: Not on file  Physical Activity: Not on file  Stress: Not on file  Social Connections: Not on file     Family History: The patient's family history includes Breast cancer in his sister; Diabetes in his father; Heart disease in his mother; Multiple sclerosis in his sister; Stomach cancer in his sister. There is no history of Healthy.  ROS:   Please see the history of present illness.    All other systems reviewed and are negative.  EKGs/Labs/Other Studies Reviewed:    EKG:  EKG is not ordered today.    Recent Labs: 03/13/2022: ALT 15; BUN 18; Creatinine, Ser 1.58; Potassium 4.5; Sodium 141  Recent Lipid Panel    Component Value Date/Time   CHOL 111 03/13/2022 0910   TRIG 74 03/13/2022 0910   HDL 42 03/13/2022 0910   CHOLHDL 2.6 03/13/2022 0910   LDLCALC 54 03/13/2022 0910   LDLDIRECT 60 04/13/2021 1633     Risk Assessment/Calculations:                Physical Exam:    VS:  BP 122/78   Pulse 64   Ht _0  (1.93 m)   Wt 224 lb (101.6 kg)   SpO2 96%   BMI 27.27 kg/m     Wt Readings from Last 3 Encounters:   03/24/22 224 lb (101.6 kg)  02/13/22 226 lb 3.2 oz (102.6 kg)  09/22/21 223 lb 9.6 oz (101.4 kg)     GEN:  Well nourished, well developed in no acute distress HEENT: Normal NECK: No JVD; No carotid bruits LYMPHATICS: No lymphadenopathy CARDIAC: RRR, no murmurs, rubs, gallops RESPIRATORY:  Clear to auscultation without rales, wheezing or rhonchi  ABDOMEN: Soft, non-tender, non-distended MUSCULOSKELETAL:  No edema; No deformity  SKIN: Warm and dry NEUROLOGIC:  Alert and oriented x 3 PSYCHIATRIC:  Normal affect   ASSESSMENT:    1.  Chronic heart failure with preserved ejection fraction (HFpEF) ([place])   2. Coronary artery disease involving native coronary artery of native heart without angina pectoris   3. Essential hypertension   4. Hyperlipidemia LDL goal <70    PLAN:    In order of problems listed above:  The patient is clinically stable on a regimen of metoprolol alone.  He has not had any heart failure symptoms and currently is New York Heart Association functional class I.  LVEF 50-55 %. Stable without symptoms of angina maintained on DAPT with aspirin and clopidogrel, beta-blocker, and high intensity statin drug. Blood pressure well-controlled on current therapy.  Most recent labs reviewed. LDL cholesterol is 54 mg/dL on atorvastatin 40 mg daily.  Overall the patient appears clinically stable from a cardiac perspective.  He has no symptoms of angina or heart failure at present.  His blood pressure is well-controlled, LDL cholesterol at goal, and he is tolerating his therapy well.  Will arrange APP follow-up in 6 months and I would like to see him back in 1 year.      Medication Adjustments/Labs and Tests Ordered: Current medicines are reviewed at length with the patient today.  Concerns regarding medicines are outlined above.  No orders of the defined types were placed in this encounter.  No orders of the defined types were placed in this encounter.   Patient  Instructions  Medication Instructions:  Your physician recommends that you continue on your current medications as directed. Please refer to the Current Medication list given to you today.   *If you need a refill on your cardiac medications before your next appointment, please call your pharmacy*  Lab Work: If you have labs (blood work) drawn today and your tests are completely normal, you will receive your results only by: [place] (if you have MyChart) OR A paper copy in the mail If you have any lab test that is abnormal or we need to change your treatment, we will call you to review the results.  Testing/Procedures: None ordered today.  Follow-Up: At [facility], you and your health needs are our priority.  As part of our continuing mission to provide you with exceptional heart care, we have created designated Provider Care Teams.  These Care Teams include your primary Cardiologist (physician) and Advanced Practice Providers (APPs -  Physician Assistants and Nurse Practitioners) who all work together to provide you with the care you need, when you need it.  We recommend signing up for the patient portal called "MyChart".  Sign up information is provided on this After Visit Summary.  MyChart is used to connect with patients for Virtual Visits (Telemedicine).  Patients are able to view lab/test results, encounter notes, upcoming appointments, etc.  Non-urgent messages can be sent to your provider as well.   To learn more about what you can do with MyChart, go to [email or web address].    Your next appointment:   6 month(s)  The format for your next appointment:   In Person  Provider:   PA or NP  Your physician wants you to follow-up in: 1 year with Dr. Emelda Fear will receive a reminder letter in the mail two months in advance. If you don't receive a letter, please call our office to schedule the follow-up appointment.   Other Instructions  Important Information  About Sugar         Signed, Adam Mocha, MD  03/24/2022 5:21 PM    [place]

## 2022-03-24 NOTE — Patient Instructions (Addendum)
Medication Instructions:  Your physician recommends that you continue on your current medications as directed. Please refer to the Current Medication list given to you today.   *If you need a refill on your cardiac medications before your next appointment, please call your pharmacy*  Lab Work: If you have labs (blood work) drawn today and your tests are completely normal, you will receive your results only by: MyChart Message (if you have MyChart) OR A paper copy in the mail If you have any lab test that is abnormal or we need to change your treatment, we will call you to review the results.  Testing/Procedures: None ordered today.  Follow-Up: At [facility], you and your health needs are our priority.  As part of our continuing mission to provide you with exceptional heart care, we have created designated Provider Care Teams.  These Care Teams include your primary Cardiologist (physician) and Advanced Practice Providers (APPs -  Physician Assistants and Nurse Practitioners) who all work together to provide you with the care you need, when you need it.  We recommend signing up for the patient portal called "MyChart".  Sign up information is provided on this After Visit Summary.  MyChart is used to connect with patients for Virtual Visits (Telemedicine).  Patients are able to view lab/test results, encounter notes, upcoming appointments, etc.  Non-urgent messages can be sent to your provider as well.   To learn more about what you can do with MyChart, go to [email or web address].    Your next appointment:   6 month(s)  The format for your next appointment:   In Person  Provider:   PA or NP  Your physician wants you to follow-up in: 1 year with Dr. Theodoro Parma will receive a reminder letter in the mail two months in advance. If you don't receive a letter, please call our office to schedule the follow-up appointment.   Other Instructions  Important Information About  Sugar

## 2022-03-29 ENCOUNTER — Other Ambulatory Visit: Payer: Self-pay | Admitting: Nurse Practitioner

## 2022-03-29 MED ORDER — POTASSIUM CHLORIDE CRYS ER 10 MEQ PO TBCR: 10.0000 meq | EXTENDED_RELEASE_TABLET | Freq: Every day | ORAL | 3 refills | Status: DC

## 2022-04-11 ENCOUNTER — Other Ambulatory Visit: Payer: Self-pay | Admitting: Nurse Practitioner

## 2022-04-15 ENCOUNTER — Other Ambulatory Visit: Payer: Self-pay | Admitting: Family Medicine

## 2022-04-15 DIAGNOSIS — E1159 Type 2 diabetes mellitus with other circulatory complications: Secondary | ICD-10-CM

## 2022-04-19 ENCOUNTER — Ambulatory Visit (INDEPENDENT_AMBULATORY_CARE_PROVIDER_SITE_OTHER): Payer: BC Managed Care – PPO

## 2022-04-19 VITALS — Ht 76.0 in | Wt 226.0 lb

## 2022-04-19 DIAGNOSIS — Z0000 Encounter for general adult medical examination without abnormal findings: Secondary | ICD-10-CM

## 2022-04-19 NOTE — Patient Instructions (Signed)
Adam [name] , Thank you for taking time to come for your Medicare Wellness Visit. I appreciate your ongoing commitment to your health goals. Please review the following plan we discussed and let me know if I can assist you in the future.   These are the goals we discussed:  Goals      Patient Stated     04/19/2022, no goals        This is a list of the screening recommended for you and due dates:  Health Maintenance  Topic Date Due   Hepatitis C Screening: USPSTF Recommendation to screen - Ages 26-79 yo.  Never done   DTaP/Tdap/Td vaccine (1 - Tdap) Never done   Zoster (Shingles) Vaccine (1 of 2) Never done   Pneumonia Vaccine (1 - PCV) Never done   Yearly kidney health urinalysis for diabetes  04/28/2022   Eye exam for diabetics  10/20/2022*   Colon Cancer Screening  10/20/2022*   Complete foot exam   05/03/2022   Hemoglobin A1C  08/14/2022   Yearly kidney function blood test for diabetes  03/14/2023   Medicare Annual Wellness Visit  04/20/2023   Flu Shot  Completed   HPV Vaccine  Aged Out   COVID-19 Vaccine  Discontinued  *Topic was postponed. The date shown is not the original due date.    Advanced directives: Please bring a copy of your POA (Power of Attorney) and/or Living Will to your next appointment.   Conditions/risks identified: none  Next appointment: Follow up in one year for your annual wellness visit.   Preventive Care 76 Years and Older, Male  Preventive care refers to lifestyle choices and visits with your health care provider that can promote health and wellness. What does preventive care include? A yearly physical exam. This is also called an annual well check. Dental exams once or twice a year. Routine eye exams. Ask your health care provider how often you should have your eyes checked. Personal lifestyle choices, including: Daily care of your teeth and gums. Regular physical activity. Eating a healthy diet. Avoiding tobacco and drug use. Limiting  alcohol use. Practicing safe sex. Taking low doses of aspirin every day. Taking vitamin and mineral supplements as recommended by your health care provider. What happens during an annual well check? The services and screenings done by your health care provider during your annual well check will depend on your age, overall health, lifestyle risk factors, and family history of disease. Counseling  Your health care provider may ask you questions about your: Alcohol use. Tobacco use. Drug use. Emotional well-being. Home and relationship well-being. Sexual activity. Eating habits. History of falls. Memory and ability to understand (cognition). Work and work Statistician. Screening  You may have the following tests or measurements: Height, weight, and BMI. Blood pressure. Lipid and cholesterol levels. These may be checked every 5 years, or more frequently if you are over 16 years old. Skin check. Lung cancer screening. You may have this screening every year starting at age 58 if you have a 30-pack-year history of smoking and currently smoke or have quit within the past 15 years. Fecal occult blood test (FOBT) of the stool. You may have this test every year starting at age 29. Flexible sigmoidoscopy or colonoscopy. You may have a sigmoidoscopy every 5 years or a colonoscopy every 10 years starting at age 88. Prostate cancer screening. Recommendations will vary depending on your family history and other risks. Hepatitis C blood test. Hepatitis B blood test. Sexually  transmitted disease (STD) testing. Diabetes screening. This is done by checking your blood sugar (glucose) after you have not eaten for a while (fasting). You may have this done every 1-3 years. Abdominal aortic aneurysm (AAA) screening. You may need this if you are a current or former smoker. Osteoporosis. You may be screened starting at age 67 if you are at high risk. Talk with your health care provider about your test results,  treatment options, and if necessary, the need for more tests. Vaccines  Your health care provider may recommend certain vaccines, such as: Influenza vaccine. This is recommended every year. Tetanus, diphtheria, and acellular pertussis (Tdap, Td) vaccine. You may need a Td booster every 10 years. Zoster vaccine. You may need this after age 103. Pneumococcal 13-valent conjugate (PCV13) vaccine. One dose is recommended after age 69. Pneumococcal polysaccharide (PPSV23) vaccine. One dose is recommended after age 36. Talk to your health care provider about which screenings and vaccines you need and how often you need them. This information is not intended to replace advice given to you by your health care provider. Make sure you discuss any questions you have with your health care provider. Document Released: 04/09/2015 Document Revised: 12/01/2015 Document Reviewed: 01/12/2015 Elsevier Interactive Patient Education  2017 [place] Prevention in the Home Falls can cause injuries. They can happen to people of all ages. There are many things you can do to make your home safe and to help prevent falls. What can I do on the outside of my home? Regularly fix the edges of walkways and driveways and fix any cracks. Remove anything that might make you trip as you walk through a door, such as a raised step or threshold. Trim any bushes or trees on the path to your home. Use bright outdoor lighting. Clear any walking paths of anything that might make someone trip, such as rocks or tools. Regularly check to see if handrails are loose or broken. Make sure that both sides of any steps have handrails. Any raised decks and porches should have guardrails on the edges. Have any leaves, snow, or ice cleared regularly. Use sand or salt on walking paths during winter. Clean up any spills in your garage right away. This includes oil or grease spills. What can I do in the bathroom? Use night  lights. Install grab bars by the toilet and in the tub and shower. Do not use towel bars as grab bars. Use non-skid mats or decals in the tub or shower. If you need to sit down in the shower, use a plastic, non-slip stool. Keep the floor dry. Clean up any water that spills on the floor as soon as it happens. Remove soap buildup in the tub or shower regularly. Attach bath mats securely with double-sided non-slip rug tape. Do not have throw rugs and other things on the floor that can make you trip. What can I do in the bedroom? Use night lights. Make sure that you have a light by your bed that is easy to reach. Do not use any sheets or blankets that are too big for your bed. They should not hang down onto the floor. Have a firm chair that has side arms. You can use this for support while you get dressed. Do not have throw rugs and other things on the floor that can make you trip. What can I do in the kitchen? Clean up any spills right away. Avoid walking on wet floors. Keep items that you use  a lot in easy-to-reach places. If you need to reach something above you, use a strong step stool that has a grab bar. Keep electrical cords out of the way. Do not use floor polish or wax that makes floors slippery. If you must use wax, use non-skid floor wax. Do not have throw rugs and other things on the floor that can make you trip. What can I do with my stairs? Do not leave any items on the stairs. Make sure that there are handrails on both sides of the stairs and use them. Fix handrails that are broken or loose. Make sure that handrails are as long as the stairways. Check any carpeting to make sure that it is firmly attached to the stairs. Fix any carpet that is loose or worn. Avoid having throw rugs at the top or bottom of the stairs. If you do have throw rugs, attach them to the floor with carpet tape. Make sure that you have a light switch at the top of the stairs and the bottom of the stairs. If  you do not have them, ask someone to add them for you. What else can I do to help prevent falls? Wear shoes that: Do not have high heels. Have rubber bottoms. Are comfortable and fit you well. Are closed at the toe. Do not wear sandals. If you use a stepladder: Make sure that it is fully opened. Do not climb a closed stepladder. Make sure that both sides of the stepladder are locked into place. Ask someone to hold it for you, if possible. Clearly mark and make sure that you can see: Any grab bars or handrails. First and last steps. Where the edge of each step is. Use tools that help you move around (mobility aids) if they are needed. These include: Canes. Walkers. Scooters. Crutches. Turn on the lights when you go into a dark area. Replace any light bulbs as soon as they burn out. Set up your furniture so you have a clear path. Avoid moving your furniture around. If any of your floors are uneven, fix them. If there are any pets around you, be aware of where they are. Review your medicines with your doctor. Some medicines can make you feel dizzy. This can increase your chance of falling. Ask your doctor what other things that you can do to help prevent falls. This information is not intended to replace advice given to you by your health care provider. Make sure you discuss any questions you have with your health care provider. Document Released: 01/07/2009 Document Revised: 08/19/2015 Document Reviewed: 04/17/2014 Elsevier Interactive Patient Education  2017 [employer].

## 2022-04-19 NOTE — Progress Notes (Signed)
I connected with Khan Chura today by telephone and verified that I am speaking with the correct person using two identifiers. Location patient: home Location provider: work Persons participating in the virtual visit: Nasario, Czerniak LPN.   I discussed the limitations, risks, security and privacy concerns of performing an evaluation and management service by telephone and the availability of in person appointments. I also discussed with the patient that there may be a patient responsible charge related to this service. The patient expressed understanding and verbally consented to this telephonic visit.    Interactive audio and video telecommunications were attempted between this provider and patient, however failed, due to patient having technical difficulties OR patient did not have access to video capability.  We continued and completed visit with audio only.     Vital signs may be patient reported or missing.   Subjective:   Adam [name] is a 67 y.o. male who presents for an Initial Medicare Annual Wellness Visit.  Review of Systems     Cardiac Risk Factors include: advanced age (>82men, >40 women);dyslipidemia;hypertension;male gender     Objective:    Today's Vitals   04/19/22 1626  Weight: 226 lb (102.5 kg)  Height: 6\' 4"  (1.93 m)   Body mass index is 27.51 kg/m.     04/19/2022    4:30 PM 02/06/2019   10:13 AM  Advanced Directives  Does Patient Have a Medical Advance Directive? Yes No  Type of Paramedic of [place];Living will   Copy of [place] in Chart? No - copy requested   Would patient like information on creating a medical advance directive?  No - Patient declined    Current Medications (verified) Outpatient Encounter Medications as of 04/19/2022  Medication Sig   aspirin EC 81 MG EC tablet Take 1 tablet (81 mg total) by mouth daily.   atorvastatin (LIPITOR) 40 MG tablet TAKE 1 TABLET BY MOUTH  DAILY AT 6 PM.   Blood Glucose Monitoring Suppl (ONE TOUCH ULTRA 2) w/Device KIT Use to check FSBS fasting daily. Dx: E11.59   Blood Pressure KIT Check blood pressure once to twice daily. Notify PCP if readings greater than 160/100. ICD10 code-I10 dispense brand covered by health insurance plan   clopidogrel (PLAVIX) 75 MG tablet Take 1 tablet (75 mg total) by mouth daily.   glipiZIDE (GLUCOTROL XL) 10 MG 24 hr tablet TAKE 1 TABLET (10 MG TOTAL) BY MOUTH DAILY WITH BREAKFAST.   glucose blood (ONETOUCH ULTRA) test strip USE TO TEST BLOOD SUGAR BID (TWO TIMES A DAY)   hydrochlorothiazide (HYDRODIURIL) 25 MG tablet TAKE 1 TABLET (25 MG TOTAL) BY MOUTH DAILY.   Lancet Devices (ONE TOUCH DELICA LANCING DEV) MISC Use to check FSBS fasting daily. Dx: E11.59   Lancets Misc. (ACCU-CHEK FASTCLIX LANCET) KIT Use to check FSBS fasting daily. Dx: E11.59   metFORMIN (GLUCOPHAGE) 500 MG tablet TAKE 1 TABLET BY MOUTH TWICE A DAY WITH FOOD   metoprolol tartrate (LOPRESSOR) 25 MG tablet Take 0.5 tablets (12.5 mg total) by mouth 2 (two) times daily.   Multiple Vitamins-Minerals (MULTI FOR HIM PO) Take by mouth. gummies with omega 3   nitroGLYCERIN (NITROSTAT) 0.4 MG SL tablet PLACE 1 TABLET UNDER THE TONUGE EVERY 5 MINUTES AS NEEDED FOR CHEST PAIN   OneTouch Delica Lancets 89H MISC Use to check FSBS fasting daily. Dx: E11.59   potassium chloride (KLOR-CON M10) 10 MEQ tablet Take 1 tablet (10 mEq total) by mouth daily.  No facility-administered encounter medications on file as of 04/19/2022.    Allergies (verified) Patient has no known allergies.   History: Past Medical History:  Diagnosis Date   Coronary artery disease    Diabetes mellitus without complication (HCC)    Myocardial infarction (HCC)    PCI/DES to pLCx, PTCA to PDA, small nondominent RCA with 95%   Occasional recreational drug use    Past Surgical History:  Procedure Laterality Date   CORONARY BALLOON ANGIOPLASTY N/A 02/05/2019    Procedure: CORONARY BALLOON ANGIOPLASTY;  Surgeon: Marykay Lex, MD;  Location: [facility] INVASIVE CV LAB;  Service: Cardiovascular;  Laterality: N/A;   CORONARY STENT INTERVENTION N/A 02/05/2019   Procedure: CORONARY STENT INTERVENTION;  Surgeon: Marykay Lex, MD;  Location: [facility] INVASIVE CV LAB;  Service: Cardiovascular;  Laterality: N/A;   CORONARY THROMBECTOMY N/A 02/05/2019   Procedure: Coronary Thrombectomy;  Surgeon: Marykay Lex, MD;  Location: [facility] INVASIVE CV LAB;  Service: Cardiovascular;  Laterality: N/A;   LEFT HEART CATH AND CORONARY ANGIOGRAPHY N/A 02/05/2019   Procedure: LEFT HEART CATH AND CORONARY ANGIOGRAPHY;  Surgeon: Marykay Lex, MD;  Location: [facility] INVASIVE CV LAB;  Service: Cardiovascular;  Laterality: N/A;   Family History  Problem Relation Age of Onset   Heart disease Mother    Diabetes Father    Breast cancer Sister    Multiple sclerosis Sister    Stomach cancer Sister    Healthy Neg Hx    Social History   Socioeconomic History   Marital status: Single    Spouse name: Not on file   Number of children: 12   Years of education: 12   Highest education level: Not on file  Occupational History   Not on file  Tobacco Use   Smoking status: Never   Smokeless tobacco: Never  Vaping Use   Vaping Use: Never used  Substance and Sexual Activity   Alcohol use: Yes    Comment: SOCIAL   Drug use: Not Currently    Types: Cocaine   Sexual activity: Not on file  Other Topics Concern   Not on file  Social History Narrative   Not on file   Social Determinants of Health   Financial Resource Strain: Low Risk  (04/19/2022)   Overall Financial Resource Strain (CARDIA)    Difficulty of Paying Living Expenses: Not hard at all  Food Insecurity: No Food Insecurity (04/19/2022)   Hunger Vital Sign    Worried About Running Out of Food in the Last Year: Never true    Ran Out of Food in the Last Year: Never true  Transportation Needs: No Transportation Needs (04/19/2022)    PRAPARE - Administrator, Civil Service (Medical): No    Lack of Transportation (Non-Medical): No  Physical Activity: Inactive (04/19/2022)   Exercise Vital Sign    Days of Exercise per Week: 0 days    Minutes of Exercise per Session: 0 min  Stress: No Stress Concern Present (04/19/2022)   [employer] of Occupational Health - Occupational Stress Questionnaire    Feeling of Stress : Not at all  Social Connections: Not on file    Tobacco Counseling Counseling given: Not Answered   Clinical Intake:  Pre-visit preparation completed: Yes  Pain : No/denies pain     Nutritional Status: BMI 25 -29 Overweight Nutritional Risks: None Diabetes: Yes  How often do you need to have someone help you when you read instructions, pamphlets, or other written materials from your  doctor or pharmacy?: 1 - Never  Diabetic? Yes Nutrition Risk Assessment:  Has the patient had any N/V/D within the last 2 months?  No  Does the patient have any non-healing wounds?  No  Has the patient had any unintentional weight loss or weight gain?  No   Diabetes:  Is the patient diabetic?  Yes  If diabetic, was a CBG obtained today?  No  Did the patient bring in their glucometer from home?  No  How often do you monitor your CBG's? daily.   Financial Strains and Diabetes Management:  Are you having any financial strains with the device, your supplies or your medication? No .  Does the patient want to be seen by Chronic Care Management for management of their diabetes?  No  Would the patient like to be referred to a Nutritionist or for Diabetic Management?  No   Diabetic Exams:  Diabetic Eye Exam: Overdue for diabetic eye exam. Pt has been advised about the importance in completing this exam. Patient advised to call and schedule an eye exam. Diabetic Foot Exam: Completed 05/03/2021   Interpreter Needed?: No  Information entered by :: NAllen LPN   Activities of Daily Living     04/19/2022    4:30 PM  In your present state of health, do you have any difficulty performing the following activities:  Hearing? 0  Vision? 0  Difficulty concentrating or making decisions? 0  Walking or climbing stairs? 0  Dressing or bathing? 0  Doing errands, shopping? 0  Preparing Food and eating ? N  Using the Toilet? N  In the past six months, have you accidently leaked urine? N  Do you have problems with loss of bowel control? N  Managing your Medications? N  Managing your Finances? N  Housekeeping or managing your Housekeeping? N    Patient Care Team: Dorna Mai, MD as PCP - General (Family Medicine) Sherren Mocha, MD as PCP - Cardiology (Cardiology)  Indicate any recent Medical Services you may have received from other than [facility] providers in the past year (date may be approximate).     Assessment:   This is a routine wellness examination for Adam [name].  Hearing/Vision screen Vision Screening - Comments:: Regular eye exams, [facility]  Dietary issues and exercise activities discussed: Current Exercise Habits: The patient has a physically strenuous job, but has no regular exercise apart from work.   Goals Addressed             This Visit's Progress    Patient Stated       04/19/2022, no goals       Depression Screen    04/19/2022    4:30 PM 02/13/2022    9:52 AM 08/17/2021   11:09 AM 04/28/2021    9:28 AM 06/13/2019   11:24 AM 04/15/2019   11:22 AM  PHQ 2/9 Scores  PHQ - 2 Score 0 0 0 0 0 0  PHQ- 9 Score  0 0 0 0     Fall Risk    04/19/2022    4:30 PM 04/28/2021    9:29 AM 06/13/2019   11:22 AM  Fall Risk   Falls in the past year? 0 0 0  Number falls in past yr: 0 0   Injury with Fall? 0 0   Risk for fall due to : Medication side effect    Follow up Falls prevention discussed;Education provided;Falls evaluation completed      FALL RISK PREVENTION PERTAINING TO  THE HOME:  Any stairs in or around the home? Yes  If so, are there any without  handrails? Yes  Home free of loose throw rugs in walkways, pet beds, electrical cords, etc? Yes  Adequate lighting in your home to reduce risk of falls? Yes   ASSISTIVE DEVICES UTILIZED TO PREVENT FALLS:  Life alert? No  Use of a cane, walker or w/c? No  Grab bars in the bathroom? Yes  Shower chair or bench in shower? No  Elevated toilet seat or a handicapped toilet? No   TIMED UP AND GO:  Was the test performed? No .      Cognitive Function:        04/19/2022    4:31 PM  6CIT Screen  What Year? 0 points  What month? 0 points  What time? 0 points  Count back from 20 0 points  Months in reverse 0 points  Repeat phrase 0 points  Total Score 0 points    Immunizations Immunization History  Administered Date(s) Administered   Fluad Quad(high Dose 65+) 04/28/2021, 02/13/2022   Influenza,inj,Quad PF,6+ Mos 02/06/2019   Moderna SARS-COV2 Booster Vaccination 01/23/2020   Moderna Sars-Covid-2 Vaccination 05/05/2019, 06/05/2019, 10/17/2019, 01/23/2020    TDAP status: Due, Education has been provided regarding the importance of this vaccine. Advised may receive this vaccine at local pharmacy or Health Dept. Aware to provide a copy of the vaccination record if obtained from local pharmacy or Health Dept. Verbalized acceptance and understanding.  Flu Vaccine status: Up to date  Pneumococcal vaccine status: Due, Education has been provided regarding the importance of this vaccine. Advised may receive this vaccine at local pharmacy or Health Dept. Aware to provide a copy of the vaccination record if obtained from local pharmacy or Health Dept. Verbalized acceptance and understanding.  Covid-19 vaccine status: Completed vaccines  Qualifies for Shingles Vaccine? Yes   Zostavax completed No   Shingrix Completed?: No.    Education has been provided regarding the importance of this vaccine. Patient has been advised to call insurance company to determine out of pocket expense if they  have not yet received this vaccine. Advised may also receive vaccine at local pharmacy or Health Dept. Verbalized acceptance and understanding.  Screening Tests Health Maintenance  Topic Date Due   Medicare Annual Wellness (AWV)  Never done   Hepatitis C Screening  Never done   DTaP/Tdap/Td (1 - Tdap) Never done   Zoster Vaccines- Shingrix (1 of 2) Never done   Pneumonia Vaccine 29+ Years old (1 - PCV) Never done   Diabetic kidney evaluation - Urine ACR  04/28/2022   OPHTHALMOLOGY EXAM  10/20/2022 (Originally 12/14/1965)   COLONOSCOPY (Pts 45-56yrs Insurance coverage will need to be confirmed)  10/20/2022 (Originally 12/15/1999)   FOOT EXAM  05/03/2022   HEMOGLOBIN A1C  08/14/2022   Diabetic kidney evaluation - eGFR measurement  03/14/2023   INFLUENZA VACCINE  Completed   HPV VACCINES  Aged Out   COVID-19 Vaccine  Discontinued    Health Maintenance  Health Maintenance Due  Topic Date Due   Medicare Annual Wellness (AWV)  Never done   Hepatitis C Screening  Never done   DTaP/Tdap/Td (1 - Tdap) Never done   Zoster Vaccines- Shingrix (1 of 2) Never done   Pneumonia Vaccine 68+ Years old (1 - PCV) Never done   Diabetic kidney evaluation - Urine ACR  04/28/2022    Colorectal cancer screening: due   Lung Cancer Screening: (Low Dose CT Chest recommended  if Age 59-80 years, 24 pack-year currently smoking OR have quit w/in 15years.) does not qualify.   Lung Cancer Screening Referral: no  Additional Screening:  Hepatitis C Screening: does qualify;   Vision Screening: Recommended annual ophthalmology exams for early detection of glaucoma and other disorders of the eye. Is the patient up to date with their annual eye exam?  No  Who is the provider or what is the name of the office in which the patient attends annual eye exams? [facility] If pt is not established with a provider, would they like to be referred to a provider to establish care? No .   Dental Screening: Recommended  annual dental exams for proper oral hygiene  Community Resource Referral / Chronic Care Management: CRR required this visit?  No   CCM required this visit?  No      Plan:     I have personally reviewed and noted the following in the patient's chart:   Medical and social history Use of alcohol, tobacco or illicit drugs  Current medications and supplements including opioid prescriptions. Patient is not currently taking opioid prescriptions. Functional ability and status Nutritional status Physical activity Advanced directives List of other physicians Hospitalizations, surgeries, and ER visits in previous 12 months Vitals Screenings to include cognitive, depression, and falls Referrals and appointments  In addition, I have reviewed and discussed with patient certain preventive protocols, quality metrics, and best practice recommendations. A written personalized care plan for preventive services as well as general preventive health recommendations were provided to patient.     Kellie Simmering, LPN   [id number]   Nurse Notes: none  Due to this being a virtual visit, the after visit summary with patients personalized plan was offered to patient via mail or my-chart.  Patient would like to access on my-chart

## 2022-05-18 ENCOUNTER — Other Ambulatory Visit: Payer: Self-pay | Admitting: Family Medicine

## 2022-05-18 ENCOUNTER — Other Ambulatory Visit: Payer: Self-pay | Admitting: Nurse Practitioner

## 2022-05-18 NOTE — Telephone Encounter (Signed)
Requested medication (s) are due for refill today: yes  Requested medication (s) are on the active medication list: yes  Last refill:  04/13/21  Future visit scheduled: yes  Notes to clinic:  Unable to refill per protocol, last refill by another provider.      Requested Prescriptions  Pending Prescriptions Disp Refills   clopidogrel (PLAVIX) 75 MG tablet 90 tablet 3    Sig: Take 1 tablet (75 mg total) by mouth daily.     Hematology: Antiplatelets - clopidogrel Failed - 05/18/2022 10:28 AM      Failed - HCT in normal range and within 180 days    Hematocrit  Date Value Ref Range Status  08/02/2020 39.4 37.5 - 51.0 % Final         Failed - HGB in normal range and within 180 days    Hemoglobin  Date Value Ref Range Status  08/02/2020 12.6 (L) 13.0 - 17.7 g/dL Final         Failed - PLT in normal range and within 180 days    Platelets  Date Value Ref Range Status  08/02/2020 181 150 - 450 x10E3/uL Final         Failed - Cr in normal range and within 360 days    Creatinine, Ser  Date Value Ref Range Status  03/13/2022 1.58 (H) 0.76 - 1.27 mg/dL Final         Passed - Valid encounter within last 6 months    Recent Outpatient Visits           3 months ago Type 2 diabetes mellitus with other circulatory complication, without long-term current use of insulin ([place])   [place] [facility], Clyde Canterbury, MD   9 months ago Type 2 diabetes mellitus with other circulatory complication, without long-term current use of insulin ([place])   [place] [facility] at [facility], Clyde Canterbury, MD   1 year ago Type 2 diabetes mellitus with other circulatory complication, without long-term current use of insulin ([place])   [place] [facility] at [facility], Clyde Canterbury, MD   2 years ago Type 2 diabetes mellitus with other circulatory complication, without long-term current use of insulin [facility])   [place] [facility] at [facility],  Bayard Beaver, MD   2 years ago    [facility] at [place]             In 2 months Dorna Mai, MD [facility] at [facility]   In 3 months Barbarann Ehlers, Junius Creamer., NP [place] at [facility], [place]

## 2022-05-18 NOTE — Telephone Encounter (Signed)
Copied from [place] [id number]. Topic: General - Other >> May 18, 2022 10:11 AM Everette C wrote: Reason for CRM: Medication Refill - Medication: clopidogrel (PLAVIX) 75 MG tablet [id number]  Has the patient contacted their pharmacy? No. (Agent: If no, request that the patient contact the pharmacy for the refill. If patient does not wish to contact the pharmacy document the reason why and proceed with request.) (Agent: If yes, when and what did the pharmacy advise?)  Preferred Pharmacy (with phone number or street name): [id number]Phone: [phone]: [phone]: Not open 24 hours   Has the patient been seen for an appointment in the last year OR does the patient have an upcoming appointment? Yes.    Agent: Please be advised that RX refills may take up to 3 business days. We ask that you follow-up with your pharmacy.

## 2022-07-15 ENCOUNTER — Other Ambulatory Visit: Payer: Self-pay | Admitting: Family Medicine

## 2022-07-15 DIAGNOSIS — E1159 Type 2 diabetes mellitus with other circulatory complications: Secondary | ICD-10-CM

## 2022-07-17 NOTE — Telephone Encounter (Signed)
Requested Prescriptions  Pending Prescriptions Disp Refills   glipiZIDE (GLUCOTROL XL) 10 MG 24 hr tablet [Pharmacy Med Name: GLIPIZIDE ER 10 MG TABLET] 90 tablet 0    Sig: TAKE 1 TABLET (10 MG TOTAL) BY MOUTH DAILY WITH BREAKFAST.     Endocrinology:  Diabetes - Sulfonylureas Failed - 07/15/2022  9:30 AM      Failed - Cr in normal range and within 360 days    Creatinine, Ser  Date Value Ref Range Status  03/13/2022 1.58 (H) 0.76 - 1.27 mg/dL Final         Passed - HBA1C is between 0 and 7.9 and within 180 days    Hemoglobin A1C  Date Value Ref Range Status  02/13/2022 5.5 4.0 - 5.6 % Final   Hgb A1c MFr Bld  Date Value Ref Range Status  08/02/2020 6.3 (H) 4.8 - 5.6 % Final    Comment:             Prediabetes: 5.7 - 6.4          Diabetes: >6.4          Glycemic control for adults with diabetes: <7.0          Passed - Valid encounter within last 6 months    Recent Outpatient Visits           5 months ago Type 2 diabetes mellitus with other circulatory complication, without long-term current use of insulin (HCC)   [place] [facility], Lauris Poag, MD   11 months ago Type 2 diabetes mellitus with other circulatory complication, without long-term current use of insulin (HCC)   [place] [facility] at [facility], Lauris Poag, MD   1 year ago Type 2 diabetes mellitus with other circulatory complication, without long-term current use of insulin (HCC)   [place] [facility] at [facility], Lauris Poag, MD   2 years ago Type 2 diabetes mellitus with other circulatory complication, without long-term current use of insulin [facility])   [place] [facility] at [facility], Kandee Keen, MD   3 years ago    [facility] at [facility]       Future Appointments             In 4 weeks Georganna Skeans, MD [place] [facility]   In 1 month Louanne Skye, Devoria Albe., NP [facility]

## 2022-08-14 ENCOUNTER — Encounter: Payer: Self-pay | Admitting: Family Medicine

## 2022-08-14 ENCOUNTER — Ambulatory Visit (INDEPENDENT_AMBULATORY_CARE_PROVIDER_SITE_OTHER): Payer: BC Managed Care – PPO | Admitting: Family Medicine

## 2022-08-14 VITALS — BP 137/78 | HR 58 | Temp 98.1°F | Resp 16 | Wt 223.2 lb

## 2022-08-14 DIAGNOSIS — E1159 Type 2 diabetes mellitus with other circulatory complications: Secondary | ICD-10-CM

## 2022-08-14 DIAGNOSIS — E785 Hyperlipidemia, unspecified: Secondary | ICD-10-CM

## 2022-08-14 DIAGNOSIS — I10 Essential (primary) hypertension: Secondary | ICD-10-CM | POA: Diagnosis not present

## 2022-08-14 DIAGNOSIS — Z7984 Long term (current) use of oral hypoglycemic drugs: Secondary | ICD-10-CM | POA: Diagnosis not present

## 2022-08-14 NOTE — Progress Notes (Unsigned)
New Patient Office Visit  Subjective    Patient ID: Adam [name], male    DOB: Feb 02, 1956  Age: 67 y.o. MRN: [id number]  CC: No chief complaint on file.   HPI Adam [name] presents to establish care ***  Outpatient Encounter Medications as of 08/14/2022  Medication Sig   aspirin EC 81 MG EC tablet Take 1 tablet (81 mg total) by mouth daily.   atorvastatin (LIPITOR) 40 MG tablet TAKE 1 TABLET BY MOUTH DAILY AT 6 PM.   Blood Glucose Monitoring Suppl (ONE TOUCH ULTRA 2) w/Device KIT Use to check FSBS fasting daily. Dx: E11.59   Blood Pressure KIT Check blood pressure once to twice daily. Notify PCP if readings greater than 160/100. ICD10 code-I10 dispense brand covered by health insurance plan   clopidogrel (PLAVIX) 75 MG tablet TAKE 1 TABLET BY MOUTH EVERY DAY   glipiZIDE (GLUCOTROL XL) 10 MG 24 hr tablet TAKE 1 TABLET (10 MG TOTAL) BY MOUTH DAILY WITH BREAKFAST.   glucose blood (ONETOUCH ULTRA) test strip USE TO TEST BLOOD SUGAR BID (TWO TIMES A DAY)   hydrochlorothiazide (HYDRODIURIL) 25 MG tablet TAKE 1 TABLET (25 MG TOTAL) BY MOUTH DAILY.   Lancet Devices (ONE TOUCH DELICA LANCING DEV) MISC Use to check FSBS fasting daily. Dx: E11.59   Lancets Misc. (ACCU-CHEK FASTCLIX LANCET) KIT Use to check FSBS fasting daily. Dx: E11.59   metFORMIN (GLUCOPHAGE) 500 MG tablet TAKE 1 TABLET BY MOUTH TWICE A DAY WITH FOOD   metoprolol tartrate (LOPRESSOR) 25 MG tablet Take 0.5 tablets (12.5 mg total) by mouth 2 (two) times daily.   Multiple Vitamins-Minerals (MULTI FOR HIM PO) Take by mouth. gummies with omega 3   nitroGLYCERIN (NITROSTAT) 0.4 MG SL tablet PLACE 1 TABLET UNDER THE TONUGE EVERY 5 MINUTES AS NEEDED FOR CHEST PAIN   OneTouch Delica Lancets 33G MISC Use to check FSBS fasting daily. Dx: E11.59   potassium chloride (KLOR-CON M10) 10 MEQ tablet Take 1 tablet (10 mEq total) by mouth daily.   No facility-administered encounter medications on file as of 08/14/2022.    Past Medical  History:  Diagnosis Date   Coronary artery disease    Diabetes mellitus without complication (HCC)    Myocardial infarction (HCC)    PCI/DES to pLCx, PTCA to PDA, small nondominent RCA with 95%   Occasional recreational drug use     Past Surgical History:  Procedure Laterality Date   CORONARY BALLOON ANGIOPLASTY N/A 02/05/2019   Procedure: CORONARY BALLOON ANGIOPLASTY;  Surgeon: Marykay Lex, MD;  Location: [facility] INVASIVE CV LAB;  Service: Cardiovascular;  Laterality: N/A;   CORONARY STENT INTERVENTION N/A 02/05/2019   Procedure: CORONARY STENT INTERVENTION;  Surgeon: Marykay Lex, MD;  Location: [facility] INVASIVE CV LAB;  Service: Cardiovascular;  Laterality: N/A;   CORONARY THROMBECTOMY N/A 02/05/2019   Procedure: Coronary Thrombectomy;  Surgeon: Marykay Lex, MD;  Location: [facility] INVASIVE CV LAB;  Service: Cardiovascular;  Laterality: N/A;   LEFT HEART CATH AND CORONARY ANGIOGRAPHY N/A 02/05/2019   Procedure: LEFT HEART CATH AND CORONARY ANGIOGRAPHY;  Surgeon: Marykay Lex, MD;  Location: [facility] INVASIVE CV LAB;  Service: Cardiovascular;  Laterality: N/A;    Family History  Problem Relation Age of Onset   Heart disease Mother    Diabetes Father    Breast cancer Sister    Multiple sclerosis Sister    Stomach cancer Sister    Healthy Neg Hx     Social History   Socioeconomic History  Marital status: Single    Spouse name: Not on file   Number of children: 12   Years of education: 12   Highest education level: Not on file  Occupational History   Not on file  Tobacco Use   Smoking status: Never   Smokeless tobacco: Never  Vaping Use   Vaping Use: Never used  Substance and Sexual Activity   Alcohol use: Yes    Comment: SOCIAL   Drug use: Not Currently    Types: Cocaine   Sexual activity: Not on file  Other Topics Concern   Not on file  Social History Narrative   Not on file   Social Determinants of Health   Financial Resource Strain: Low Risk  (04/19/2022)    Overall Financial Resource Strain (CARDIA)    Difficulty of Paying Living Expenses: Not hard at all  Food Insecurity: No Food Insecurity (04/19/2022)   Hunger Vital Sign    Worried About Running Out of Food in the Last Year: Never true    Ran Out of Food in the Last Year: Never true  Transportation Needs: No Transportation Needs (04/19/2022)   PRAPARE - Administrator, Civil Service (Medical): No    Lack of Transportation (Non-Medical): No  Physical Activity: Inactive (04/19/2022)   Exercise Vital Sign    Days of Exercise per Week: 0 days    Minutes of Exercise per Session: 0 min  Stress: No Stress Concern Present (04/19/2022)   [employer] of Occupational Health - Occupational Stress Questionnaire    Feeling of Stress : Not at all  Social Connections: Not on file  Intimate Partner Violence: Not on file    ROS      Objective    BP 137/78   Pulse (!) 58   Temp 98.1 F (36.7 C) (Oral)   Resp 16   Wt 223 lb 3.2 oz (101.2 kg)   SpO2 94%   BMI 27.17 kg/m   Physical Exam  {Labs (Optional):23779}    Assessment & Plan:   Problem List Items Addressed This Visit       Cardiovascular and Mediastinum   Hypertension     Endocrine   Type 2 diabetes mellitus (HCC) - Primary   Relevant Orders   Microalbumin / creatinine urine ratio   POCT glycosylated hemoglobin (Hb A1C)   HM DIABETES FOOT EXAM (Completed)     Other   Hyperlipidemia    No follow-ups on file.   [name], MD

## 2022-08-15 ENCOUNTER — Other Ambulatory Visit: Payer: Self-pay | Admitting: Family Medicine

## 2022-08-15 LAB — MICROALBUMIN / CREATININE URINE RATIO
Creatinine, Urine: 214.2 mg/dL
Microalb/Creat Ratio: 22 mg/g creat (ref 0–29)
Microalbumin, Urine: 46.7 ug/mL

## 2022-08-16 ENCOUNTER — Encounter: Payer: Self-pay | Admitting: Family Medicine

## 2022-08-16 NOTE — Telephone Encounter (Signed)
Requested Prescriptions  Pending Prescriptions Disp Refills   metFORMIN (GLUCOPHAGE) 500 MG tablet [Pharmacy Med Name: METFORMIN HCL 500 MG TABLET] 180 tablet 1    Sig: TAKE 1 TABLET BY MOUTH TWICE A DAY WITH FOOD     Endocrinology:  Diabetes - Biguanides Failed - 08/15/2022  8:19 PM      Failed - Cr in normal range and within 360 days    Creatinine, Ser  Date Value Ref Range Status  03/13/2022 1.58 (H) 0.76 - 1.27 mg/dL Final         Failed - HBA1C is between 0 and 7.9 and within 180 days    Hemoglobin A1C  Date Value Ref Range Status  02/13/2022 5.5 4.0 - 5.6 % Final   Hgb A1c MFr Bld  Date Value Ref Range Status  08/02/2020 6.3 (H) 4.8 - 5.6 % Final    Comment:             Prediabetes: 5.7 - 6.4          Diabetes: >6.4          Glycemic control for adults with diabetes: <7.0          Failed - eGFR in normal range and within 360 days    GFR calc Af Amer  Date Value Ref Range Status  06/18/2019 88 >59 mL/min/1.73 Final   GFR calc non Af Amer  Date Value Ref Range Status  06/18/2019 76 >59 mL/min/1.73 Final   eGFR  Date Value Ref Range Status  03/13/2022 48 (L) >59 mL/min/1.73 Final         Failed - B12 Level in normal range and within 720 days    No results found for: "VITAMINB12"       Failed - CBC within normal limits and completed in the last 12 months    WBC  Date Value Ref Range Status  08/02/2020 5.2 3.4 - 10.8 x10E3/uL Final  02/06/2019 9.6 4.0 - 10.5 K/uL Final   RBC  Date Value Ref Range Status  08/02/2020 4.52 4.14 - 5.80 x10E6/uL Final  02/06/2019 4.35 4.22 - 5.81 MIL/uL Final   Hemoglobin  Date Value Ref Range Status  08/02/2020 12.6 (L) 13.0 - 17.7 g/dL Final   Hematocrit  Date Value Ref Range Status  08/02/2020 39.4 37.5 - 51.0 % Final   MCHC  Date Value Ref Range Status  08/02/2020 32.0 31.5 - 35.7 g/dL Final  29/56/2130 86.5 30.0 - 36.0 g/dL Final   [facility]  Date Value Ref Range Status  08/02/2020 27.9 26.6 - 33.0 pg Final   02/06/2019 28.0 26.0 - 34.0 pg Final   MCV  Date Value Ref Range Status  08/02/2020 87 79 - 97 fL Final   No results found for: "PLTCOUNTKUC", "LABPLAT", "POCPLA" RDW  Date Value Ref Range Status  08/02/2020 11.2 (L) 11.6 - 15.4 % Final         Passed - Valid encounter within last 6 months    Recent Outpatient Visits           2 days ago Type 2 diabetes mellitus with other circulatory complication, without long-term current use of insulin (HCC)   [place] [facility], Lauris Poag, MD   6 months ago Type 2 diabetes mellitus with other circulatory complication, without long-term current use of insulin (HCC)   [place] [facility], Lauris Poag, MD   12 months ago Type 2 diabetes mellitus with other circulatory  complication, without long-term current use of insulin (HCC)   [place] [facility], Lauris Poag, MD   1 year ago Type 2 diabetes mellitus with other circulatory complication, without long-term current use of insulin [facility])   [place] [facility], Lauris Poag, MD   2 years ago Type 2 diabetes mellitus with other circulatory complication, without long-term current use of insulin [facility])   [place] [facility], Kandee Keen, MD       Future Appointments             In 3 weeks Louanne Skye, Devoria Albe., NP [facility]   In 6 months Georganna Skeans, MD [facility]

## 2022-08-18 ENCOUNTER — Encounter: Payer: Self-pay | Admitting: *Deleted

## 2022-08-18 NOTE — Progress Notes (Signed)
[facility] Quality Team Note  Name: HAZIM MORTER Date of Birth: Oct 21, 1955 MRN: [id number] Date: 08/18/2022  [facility] Quality Team has reviewed this patient's chart, please see recommendations below:  Colorectal Screening; Pt has open gap.  Called pt.  He would like to discuss having it done in July.  His next ov is in Nov. Provider Office to Schedule Appointment For (Reason Type); Patient requests provider office to schedule patient appointment for (A1C). Please call patient back with appointment details. Pt has open gap for A1C.  Would like it done in July if possible.

## 2022-09-05 NOTE — Progress Notes (Unsigned)
Office Visit    Patient Name: Adam [name] Date of Encounter: 09/05/2022  Primary Care Provider:  Georganna Skeans, MD Primary Cardiologist:  Tonny Bollman, MD Primary Electrophysiologist: None   Past Medical History    Past Medical History:  Diagnosis Date   Coronary artery disease    Diabetes mellitus without complication (HCC)    Myocardial infarction (HCC)    PCI/DES to pLCx, PTCA to PDA, small nondominent RCA with 95%   Occasional recreational drug use    Past Surgical History:  Procedure Laterality Date   CORONARY BALLOON ANGIOPLASTY N/A 02/05/2019   Procedure: CORONARY BALLOON ANGIOPLASTY;  Surgeon: Marykay Lex, MD;  Location: [facility] INVASIVE CV LAB;  Service: Cardiovascular;  Laterality: N/A;   CORONARY STENT INTERVENTION N/A 02/05/2019   Procedure: CORONARY STENT INTERVENTION;  Surgeon: Marykay Lex, MD;  Location: [facility] INVASIVE CV LAB;  Service: Cardiovascular;  Laterality: N/A;   CORONARY THROMBECTOMY N/A 02/05/2019   Procedure: Coronary Thrombectomy;  Surgeon: Marykay Lex, MD;  Location: [facility] INVASIVE CV LAB;  Service: Cardiovascular;  Laterality: N/A;   LEFT HEART CATH AND CORONARY ANGIOGRAPHY N/A 02/05/2019   Procedure: LEFT HEART CATH AND CORONARY ANGIOGRAPHY;  Surgeon: Marykay Lex, MD;  Location: [facility] INVASIVE CV LAB;  Service: Cardiovascular;  Laterality: N/A;    Allergies  No Known Allergies   History of Present Illness    Adam [name]  is a 67 year old male with a PMH of CAD s/p NSTEMI 01/2019 with severe three-vessel CAD treated with DES and balloon angioplasty to proximal circumflex lesion and aspiration thrombectomy and PTCA of ostial PDA, HLD, DM type II, polysubstance abuse (cocaine) who presents today for 51-month follow-up.  Adam [name] was seen initially in 01/2019 when he presented to the ED with NSTEMI with troponin peaking to > 27,000 and underwent LHC showing severe three-vessel disease with 95% calcified thrombotic stenosis in  proximal circumflex treated with DES x 1, 6% bifurcation LAD lesion and first diagonal treated with medical management, 95% proximal small nondominant RCA treated with medical management.  He was discharged with Brilinta 90 mg twice daily and ASA 81 mg.  2D echo was completed showing 50-55% with RWMA, LVH, grade 1 DD, pericardial fat pad, mild MR/mild TR and mild dilation of aortic root (41 mm).  He was seen in follow-up 07/2020 and reported running out of Brilinta and was switched to Plavix.  He was last seen by Dr. Excell Seltzer on 03/24/2022 and reported doing well at that time.  He was noted to have improvement to his hemoglobin A1c and blood pressures were well-controlled.  He was also found to have LDL cholesterol at goal.  Since last being seen in the office patient reports***.  Patient denies chest pain, palpitations, dyspnea, PND, orthopnea, nausea, vomiting, dizziness, syncope, edema, weight gain, or early satiety.     ***Notes:  Home Medications    Current Outpatient Medications  Medication Sig Dispense Refill   aspirin EC 81 MG EC tablet Take 1 tablet (81 mg total) by mouth daily. 90 tablet 3   atorvastatin (LIPITOR) 40 MG tablet TAKE 1 TABLET BY MOUTH DAILY AT 6 PM. 90 tablet 2   Blood Glucose Monitoring Suppl (ONE TOUCH ULTRA 2) w/Device KIT Use to check FSBS fasting daily. Dx: E11.59 1 kit 0   Blood Pressure KIT Check blood pressure once to twice daily. Notify PCP if readings greater than 160/100. ICD10 code-I10 dispense brand covered by health insurance plan 1 kit  0   clopidogrel (PLAVIX) 75 MG tablet TAKE 1 TABLET BY MOUTH EVERY DAY 90 tablet 3   glipiZIDE (GLUCOTROL XL) 10 MG 24 hr tablet TAKE 1 TABLET (10 MG TOTAL) BY MOUTH DAILY WITH BREAKFAST. 90 tablet 0   glucose blood (ONETOUCH ULTRA) test strip USE TO TEST BLOOD SUGAR BID (TWO TIMES A DAY) 100 strip 3   hydrochlorothiazide (HYDRODIURIL) 25 MG tablet TAKE 1 TABLET (25 MG TOTAL) BY MOUTH DAILY. 90 tablet 3   Lancet Devices (ONE  TOUCH DELICA LANCING DEV) MISC Use to check FSBS fasting daily. Dx: E11.59 1 each 0   Lancets Misc. (ACCU-CHEK FASTCLIX LANCET) KIT Use to check FSBS fasting daily. Dx: E11.59 1 kit 0   metFORMIN (GLUCOPHAGE) 500 MG tablet TAKE 1 TABLET BY MOUTH TWICE A DAY WITH FOOD 180 tablet 1   metoprolol tartrate (LOPRESSOR) 25 MG tablet Take 0.5 tablets (12.5 mg total) by mouth 2 (two) times daily. 90 tablet 3   Multiple Vitamins-Minerals (MULTI FOR HIM PO) Take by mouth. gummies with omega 3     nitroGLYCERIN (NITROSTAT) 0.4 MG SL tablet PLACE 1 TABLET UNDER THE TONUGE EVERY 5 MINUTES AS NEEDED FOR CHEST PAIN 25 tablet 1   OneTouch Delica Lancets 33G MISC Use to check FSBS fasting daily. Dx: E11.59 100 each 1   potassium chloride (KLOR-CON M10) 10 MEQ tablet Take 1 tablet (10 mEq total) by mouth daily. 90 tablet 3   No current facility-administered medications for this visit.     Review of Systems  Please see the history of present illness.    (+)*** (+)***  All other systems reviewed and are otherwise negative except as noted above.  Physical Exam    Wt Readings from Last 3 Encounters:  08/14/22 223 lb 3.2 oz (101.2 kg)  04/19/22 226 lb (102.5 kg)  03/24/22 224 lb (101.6 kg)   GN:FAOZH were no vitals filed for this visit.,There is no height or weight on file to calculate BMI.  Constitutional:      Appearance: Healthy appearance. Not in distress.  Neck:     Vascular: JVD normal.  Pulmonary:     Effort: Pulmonary effort is normal.     Breath sounds: No wheezing. No rales. Diminished in the bases Cardiovascular:     Normal rate. Regular rhythm. Normal S1. Normal S2.      Murmurs: There is no murmur.  Edema:    Peripheral edema absent.  Abdominal:     Palpations: Abdomen is soft non tender. There is no hepatomegaly.  Skin:    General: Skin is warm and dry.  Neurological:     General: No focal deficit present.     Mental Status: Alert and oriented to person, place and time.      Cranial Nerves: Cranial nerves are intact.  EKG/LABS/ Recent Cardiac Studies    ECG personally reviewed by me today - ***  Cardiac Studies & Procedures   CARDIAC CATHETERIZATION  CARDIAC CATHETERIZATION 02/05/2019  Narrative Images from the original result were not included.   CULPRIT LESION: Prox Cx to Mid Cx lesion is 95% stenosed. Dist Cx lesion is 60% stenosed.  A drug-eluting stent was successfully placed covering both lesions, using a STENT RESOLUTE ONYX 3.0X22.-Postdilated to 3.3 mm  Post intervention, there is a 0% residual stenosis.  --  LESION SEGMENT #2: LPAV lesion is 100% stenosed - heavy concentrated thrombus  1 passive aspiration thrombectomy followed by Balloon angioplasty was performed using a BALLOON SAPPHIRE 2.5X15.  Post intervention, there is a 60% residual stenosis -still significant thrombus burden  LPDA lesion is 95% stenosed -unable to tell the vessel size, very difficult to wire. Plan is to treat with Aggrastat to clear up thrombus burden and improve flow.  ---------  Suezanne Jacquet LAD to Prox LAD lesion is 60% stenosed with 1st Diag lesion is 50% stenosed. - bifurcation lesion .  Prox RCA (very small caliber, non-dominant) lesion is 95% stenosed.  ---  The left ventricular systolic function is normal. The left ventricular ejection fraction is 50-55% by visual estimate.  Dist Cx lesion is 60% stenosed.  SUMMARY  Severe 3 vessel disease with culprit lesion being 95% heavily thrombotic ulcerated stenosis in the proximal Circumflex with thrombotic occlusion of the proximal Left PDA, there is 60% bifurcation LAD-1stDiag stenosis (plan medical management), and 95% proximal small nondominant RCA (medical management)  Successful DES PCI of pLCx using resolute Onyx DES 3.0 mm x 22 mm (3.3 mm)  Aspiration Thrombectomy and PTCA only of ostial beat PDA -> suboptimal result due to significant thrombus burden  Preserved EF with inferior apical hypokinesis.  Mildly  elevated LVEDP   RECOMMENDATIONS  Return to nursing unit for TR band removal.  We will run Aggrastat for 12 hours to improve distal flow  Plan for now is to treat based on symptomatology.  Would consider LAD-1STDiag bifurcation intervention if symptoms warrant in the future.  Aggressive risk factor modification --statin, (nonselective beta-blocker given recent tox screen)  Counseling to avoid substance abuse    Bryan Lemma, M.D., M.S. Interventional Cardiologist  Pager # [phone] Phone # [phone] [address] [place], [place] [address]  Findings Coronary Findings Diagnostic  Dominance: Left  Left Main Vessel is large.  Left Anterior Descending Ost LAD to Prox LAD lesion is 60% stenosed. The lesion is located at the bifurcation and concentric. The lesion is moderately calcified. Mid LAD lesion is 25% stenosed. The lesion is segmental and eccentric.  First Diagonal Branch Vessel is Moderate to Large in Size High diagonal that courses of the ramus intermedius 1st Diag lesion is 50% stenosed. The lesion is located at the bifurcation and concentric.  Second Diagonal Branch Vessel is small in size. There is moderate disease in the vessel.  Lateral Second Diagonal Branch Vessel is small in size.  Second Septal Branch Vessel is small in size.  Third Diagonal Branch Vessel is small in size. There is moderate disease in the vessel.  Left Circumflex Vessel is large. Prox Cx to Mid Cx lesion is 95% stenosed. The lesion is located proximal to the major branch, concentric, irregular, thrombotic and ulcerative. Dist Cx lesion is 60% stenosed.  First Obtuse Marginal Branch Vessel is small in size.  Left Posterior Descending Artery Vessel is small in size. LPDA lesion is 95% stenosed.  First Left Posterolateral Branch Vessel is large in size.  Left Posterior Atrioventricular Artery LPAV lesion is 100% stenosed. The lesion is heavily  thrombotic.  Right Coronary Artery Vessel is small. Prox RCA lesion is 95% stenosed. Small caliber nondominant vessel.  Too small for PCI  Acute Marginal Branch Vessel is small in size.  Right Ventricular Branch Vessel is small in size.  Intervention  Prox Cx to Mid Cx lesion Stent (Also treats lesions: Dist Cx) Lesion length:  18 mm. CATH VISTA GUIDE 6FR XB3.5 guide catheter was inserted. Lesion crossed with guidewire using a WIRE ASAHI PROWATER 180CM. Pre-stent angioplasty was performed using a BALLOON SAPPHIRE 2.5X15. Maximum pressure:  10 atm.  Inflation time:  20 sec. A drug-eluting stent was successfully placed using a STENT RESOLUTE ONYX 3.0X22. Maximum pressure: 16 atm. Inflation time: 30 sec. Minimum lumen area:  3.3 mm. Stent strut is well apposed. Post-stent angioplasty was performed. Maximum pressure:  18 atm. Inflation time:  20 sec. Stent balloon-high ATM Post-Intervention Lesion Assessment The intervention was successful. Pre-interventional TIMI flow is 3. Post-intervention TIMI flow is 3. Treated lesion length:  22 mm. No complications occurred at this lesion. There is a 0% residual stenosis post intervention.  Dist Cx lesion Stent (Also treats lesions: Prox Cx to Mid Cx) See details in Prox Cx to Mid Cx lesion. Post-Intervention Lesion Assessment The intervention was successful. There is a 0% residual stenosis post intervention.  LPAV lesion Angioplasty Thrombectomy Aspiration thrombectomy performed using a CATH EXTRAC PRONTO 5.44F 138CM. 1 passes taken. Unable to extract any thrombus Angioplasty Lesion length:  20 mm. CATH VISTA GUIDE 6FR XB3.5 guide catheter was inserted. WIRE ASAHI PROWATER 180CM guidewire used to cross lesion. Balloon angioplasty was performed using a BALLOON SAPPHIRE 2.5X15. Maximum pressure: 8 atm. Inflation time: 20 sec. Post-Intervention Lesion Assessment The intervention was unsuccessful due to lesion rigidity. Suboptimal with minimal  distal flow Pre-interventional TIMI flow is 0. Post-intervention TIMI flow is 1. Treated lesion length:  24 mm. Still unable to send flow distally to the PDA due to extensive thrombus.  Unable to give nitroglycerin or verapamil, adenosine due to hypotension. There is a 60% residual stenosis post intervention.     ECHOCARDIOGRAM  ECHOCARDIOGRAM COMPLETE 02/06/2019  Narrative ECHOCARDIOGRAM REPORT    Patient Name:   Adam [name] Date of Exam: 02/06/2019 Medical Rec #:  [id number]       Height:       75.0 in Accession #:    [id number]      Weight:       205.4 lb Date of Birth:  [birth year]       BSA:          2.22 m Patient Age:    63 years        BP:           102/70 mmHg Patient Gender: M               HR:           59 bpm. Exam Location:  Inpatient  Procedure: 2D Echo, Cardiac Doppler and Color Doppler  MODIFIED REPORT: This report was modified by Lennie Odor MD on 02/06/2019 due to Addition of aortic root/asc ao measurements. Indications:     Non STEMI  History:         Patient has no prior history of Echocardiogram examinations. Acute MI, Signs/Symptoms:Chest Pain; Risk Factors:Diabetes. Cocaine abuse.  Sonographer:     Lavenia Atlas Referring Phys:  [id number] ONGEX W ROSE Diagnosing Phys: Lennie Odor MD  IMPRESSIONS   1. Left ventricular ejection fraction, by visual estimation, is 50 to 55%. The left ventricle has low normal function. There is borderline left ventricular hypertrophy. 2. Basal and mid inferolateral wall and basal inferior segment are abnormal. 3. Left ventricular diastolic parameters are consistent with Grade I diastolic dysfunction (impaired relaxation). 4. The left ventricle demonstrates regional wall motion abnormalities. 5. Global right ventricle has normal systolic function.The right ventricular size is normal. No increase in right ventricular wall thickness. 6. Left atrial size was normal. 7. Right atrial size was normal. 8. Presence of  pericardial fat pad. 9. The pericardial effusion is circumferential. 10.  Mild mitral annular calcification. 11. The mitral valve is grossly normal. Mild mitral valve regurgitation. 12. The tricuspid valve is grossly normal. Tricuspid valve regurgitation is mild. 13. The aortic valve is tricuspid. Aortic valve regurgitation is not visualized. No evidence of aortic valve sclerosis or stenosis. 14. The pulmonic valve was grossly normal. Pulmonic valve regurgitation is not visualized. 15. Aortic dilatation noted. 16. There is mild dilatation of the aortic root measuring 41 mm. 17. The ascending aorta is mildly dilated ~40 mm. 18. Normal pulmonary artery systolic pressure. 19. The tricuspid regurgitant velocity is 2.22 m/s, and with an assumed right atrial pressure of 3 mmHg, the estimated right ventricular systolic pressure is normal at 22.6 mmHg. 20. The inferior vena cava is normal in size with greater than 50% respiratory variability, suggesting right atrial pressure of 3 mmHg.  FINDINGS Left Ventricle: Left ventricular ejection fraction, by visual estimation, is 50 to 55%. The left ventricle has low normal function. The left ventricle demonstrates regional wall motion abnormalities. The left ventricular internal cavity size was the left ventricle is normal in size. There is borderline left ventricular hypertrophy. Left ventricular diastolic parameters are consistent with Grade I diastolic dysfunction (impaired relaxation). Normal left atrial pressure.   LV Wall Scoring: The basal and mid inferolateral wall and basal inferior segment are hypokinetic.  Right Ventricle: The right ventricular size is normal. No increase in right ventricular wall thickness. Global RV systolic function is has normal systolic function. The tricuspid regurgitant velocity is 2.22 m/s, and with an assumed right atrial pressure of 3 mmHg, the estimated right ventricular systolic pressure is normal at 22.6 mmHg.  Left  Atrium: Left atrial size was normal in size.  Right Atrium: Right atrial size was normal in size  Pericardium: There is no evidence of pericardial effusion. The pericardial effusion is circumferential. Presence of pericardial fat pad.  Mitral Valve: The mitral valve is grossly normal. There is mild thickening of the mitral valve leaflet(s). Mild mitral annular calcification. Mild mitral valve regurgitation.  Tricuspid Valve: The tricuspid valve is grossly normal. Tricuspid valve regurgitation is mild.  Aortic Valve: The aortic valve is tricuspid. Aortic valve regurgitation is not visualized. The aortic valve is structurally normal, with no evidence of sclerosis or stenosis.  Pulmonic Valve: The pulmonic valve was grossly normal. Pulmonic valve regurgitation is not visualized.  Aorta: Aortic dilatation noted. There is mild dilatation of the aortic root measuring 41 mm. The ascending aorta is mildly dilated ~40 mm.  Venous: The inferior vena cava is normal in size with greater than 50% respiratory variability, suggesting right atrial pressure of 3 mmHg.  IAS/Shunts: No atrial level shunt detected by color flow Doppler.    LEFT VENTRICLE PLAX 2D LVIDd:         4.72 cm  Diastology LVIDs:         3.65 cm  LV e' lateral:   6.20 cm/s LV PW:         1.21 cm  LV E/e' lateral: 12.6 LV IVS:        1.21 cm  LV e' medial:    6.09 cm/s LVOT diam:     2.10 cm  LV E/e' medial:  12.8 LV SV:         47 ml LV SV Index:   21.15 LVOT Area:     3.46 cm   RIGHT VENTRICLE RV Basal diam:  3.50 cm RV S prime:     12.10 cm/s TAPSE (M-mode): 2.1 cm  LEFT  ATRIUM             Index       RIGHT ATRIUM           Index LA diam:        4.20 cm 1.89 cm/m  RA Area:     15.40 cm LA Vol (A2C):   55.2 ml 24.87 ml/m RA Volume:   38.30 ml  17.26 ml/m LA Vol (A4C):   50.1 ml 22.57 ml/m LA Biplane Vol: 55.7 ml 25.10 ml/m AORTIC VALVE LVOT Vmax:   73.10 cm/s LVOT Vmean:  45.200 cm/s LVOT VTI:    0.170  m  AORTA Ao Root diam: 4.00 cm  MITRAL VALVE                        TRICUSPID VALVE MV Area (PHT): 4.60 cm             TR Peak grad:   19.6 mmHg MV PHT:        47.85 msec           TR Vmax:        237.00 cm/s MV Decel Time: 165 msec MV E velocity: 78.00 cm/s 103 cm/s  SHUNTS MV A velocity: 66.00 cm/s 70.3 cm/s Systemic VTI:  0.17 m MV E/A ratio:  1.18       1.5       Systemic Diam: 2.10 cm   Lennie Odor MD Electronically signed by Lennie Odor MD Signature Date/Time: 02/06/2019/11:31:40 AM    Final (Updated)             Risk Assessment/Calculations:   {Does this patient have ATRIAL FIBRILLATION?:[phone]}        Lab Results  Component Value Date   WBC 5.2 08/02/2020   HGB 12.6 (L) 08/02/2020   HCT 39.4 08/02/2020   MCV 87 08/02/2020   PLT 181 08/02/2020   Lab Results  Component Value Date   CREATININE 1.58 (H) 03/13/2022   BUN 18 03/13/2022   NA 141 03/13/2022   K 4.5 03/13/2022   CL 104 03/13/2022   CO2 24 03/13/2022   Lab Results  Component Value Date   ALT 15 03/13/2022   AST 12 03/13/2022   ALKPHOS 77 03/13/2022   BILITOT 1.0 03/13/2022   Lab Results  Component Value Date   CHOL 111 03/13/2022   HDL 42 03/13/2022   LDLCALC 54 03/13/2022   LDLDIRECT 60 04/13/2021   TRIG 74 03/13/2022   CHOLHDL 2.6 03/13/2022    Lab Results  Component Value Date   HGBA1C 5.5 02/13/2022     Assessment & Plan    1.  Coronary artery disease  2.  Essential hypertension  3.  Hyperlipidemia  4.  DM type II  5.  Cocaine abuse      Disposition: Follow-up with Tonny Bollman, MD or APP in *** months {Are you ordering a CV Procedure (e.g. stress test, cath, DCCV, TEE, etc)?   Press F2        :[id number]}   Medication Adjustments/Labs and Tests Ordered: Current medicines are reviewed at length with the patient today.  Concerns regarding medicines are outlined above.   Signed, Napoleon Form, Leodis Rains, NP 09/05/2022, 7:05 PM [facility]

## 2022-09-06 ENCOUNTER — Ambulatory Visit: Payer: BC Managed Care – PPO | Attending: Nurse Practitioner | Admitting: Nurse Practitioner

## 2022-09-06 ENCOUNTER — Encounter: Payer: Self-pay | Admitting: Nurse Practitioner

## 2022-09-06 VITALS — BP 122/80 | HR 78 | Ht 76.0 in | Wt 221.0 lb

## 2022-09-06 DIAGNOSIS — E1159 Type 2 diabetes mellitus with other circulatory complications: Secondary | ICD-10-CM | POA: Diagnosis not present

## 2022-09-06 DIAGNOSIS — F1910 Other psychoactive substance abuse, uncomplicated: Secondary | ICD-10-CM

## 2022-09-06 DIAGNOSIS — I10 Essential (primary) hypertension: Secondary | ICD-10-CM | POA: Diagnosis not present

## 2022-09-06 DIAGNOSIS — E785 Hyperlipidemia, unspecified: Secondary | ICD-10-CM | POA: Diagnosis not present

## 2022-09-06 DIAGNOSIS — I25810 Atherosclerosis of coronary artery bypass graft(s) without angina pectoris: Secondary | ICD-10-CM

## 2022-09-06 DIAGNOSIS — Z7984 Long term (current) use of oral hypoglycemic drugs: Secondary | ICD-10-CM

## 2022-09-06 MED ORDER — ATORVASTATIN CALCIUM 40 MG PO TABS: ORAL_TABLET | ORAL | 3 refills | Status: DC

## 2022-09-06 MED ORDER — METOPROLOL TARTRATE 25 MG PO TABS: 12.5000 mg | ORAL_TABLET | Freq: Two times a day (BID) | ORAL | 3 refills | Status: AC

## 2022-09-06 MED ORDER — NITROGLYCERIN 0.4 MG SL SUBL: SUBLINGUAL_TABLET | SUBLINGUAL | 3 refills | Status: DC

## 2022-09-06 NOTE — Patient Instructions (Addendum)
Medication Instructions:  Your physician recommends that you continue on your current medications as directed. Please refer to the Current Medication list given to you today. *If you need a refill on your cardiac medications before your next appointment, please call your pharmacy*   Lab Work: None Ordered   Testing/Procedures: None    Follow-Up: At [facility], you and your health needs are our priority.  As part of our continuing mission to provide you with exceptional heart care, we have created designated Provider Care Teams.  These Care Teams include your primary Cardiologist (physician) and Advanced Practice Providers (APPs -  Physician Assistants and Nurse Practitioners) who all work together to provide you with the care you need, when you need it.  We recommend signing up for the patient portal called "MyChart".  Sign up information is provided on this After Visit Summary.  MyChart is used to connect with patients for Virtual Visits (Telemedicine).  Patients are able to view lab/test results, encounter notes, upcoming appointments, etc.  Non-urgent messages can be sent to your provider as well.   To learn more about what you can do with MyChart, go to [email or web address].    Your next appointment:   12 month(s)  Provider:   Tonny Bollman, MD     Other Instructions

## 2022-09-07 ENCOUNTER — Encounter: Payer: Self-pay | Admitting: Nurse Practitioner

## 2022-09-26 ENCOUNTER — Other Ambulatory Visit: Payer: Self-pay | Admitting: Family Medicine

## 2022-09-26 DIAGNOSIS — E1159 Type 2 diabetes mellitus with other circulatory complications: Secondary | ICD-10-CM

## 2022-10-02 ENCOUNTER — Other Ambulatory Visit: Payer: Self-pay | Admitting: Nurse Practitioner

## 2022-10-04 ENCOUNTER — Other Ambulatory Visit: Payer: Self-pay | Admitting: Family Medicine

## 2022-10-04 NOTE — Telephone Encounter (Signed)
Medication Refill - Medication: Lancet Devices (ONE TOUCH DELICA LANCING DEV) MISC   Has the patient contacted their pharmacy? No.  Preferred Pharmacy (with phone number or street name):  [facility] [phone] G[place] Otto, [place] - [address] Phone: [phone]  Fax: [phone]     Has the patient been seen for an appointment in the last year OR does the patient have an upcoming appointment? No.  Agent: Please be advised that RX refills may take up to 3 business days. We ask that you follow-up with your pharmacy.

## 2022-10-04 NOTE — Telephone Encounter (Signed)
Requested medication (s) are due for refill today: yes  Requested medication (s) are on the active medication list: yes  Last refill:  11/07/19  Future visit scheduled: yes  Notes to clinic:  Unable to refill per protocol, last refill by another provider.      Requested Prescriptions  Pending Prescriptions Disp Refills   OneTouch Delica Lancets 33G MISC 100 each 1    Sig: Use to check FSBS fasting daily. Dx: E11.59     Endocrinology: Diabetes - Testing Supplies Passed - 10/04/2022  9:14 AM      Passed - Valid encounter within last 12 months    Recent Outpatient Visits           1 month ago Type 2 diabetes mellitus with other circulatory complication, without long-term current use of insulin (HCC)   [facility], Lauris Poag, MD   7 months ago Type 2 diabetes mellitus with other circulatory complication, without long-term current use of insulin (HCC)   [place] [facility] at [facility], Lauris Poag, MD   1 year ago Type 2 diabetes mellitus with other circulatory complication, without long-term current use of insulin (HCC)   [place] [facility] at [facility], Lauris Poag, MD   1 year ago Type 2 diabetes mellitus with other circulatory complication, without long-term current use of insulin [facility])   [place] [facility] at [facility], Lauris Poag, MD   2 years ago Type 2 diabetes mellitus with other circulatory complication, without long-term current use of insulin [facility])   [place] [facility] at [facility], Kandee Keen, MD       Future Appointments             In 4 months Georganna Skeans, MD [facility] at [facility]

## 2022-10-09 MED ORDER — ONETOUCH DELICA LANCETS 33G MISC: 1 refills | Status: AC

## 2022-10-10 ENCOUNTER — Other Ambulatory Visit: Payer: Self-pay | Admitting: Family Medicine

## 2022-10-10 NOTE — Telephone Encounter (Signed)
Medication Refill - Medication: Lancet Devices (ONE TOUCH DELICA LANCING DEV) MISC  LANCETS FOR ONE TOUCH METER Has only a few left & needs these refilled   Has the patient contacted their pharmacy? No.   Preferred Pharmacy (with phone number or street name):   [facility] [phone] [place] Otto, [place] - [address] Phone: [phone]  Fax: [phone]      Has the patient been seen for an appointment in the last year OR does the patient have an upcoming appointment? YES. F/U SCHEDULED ON 11.20.24    Patients callback #  [phone]

## 2022-10-11 MED ORDER — ONETOUCH DELICA LANCING DEV MISC: 0 refills | Status: AC

## 2022-10-11 NOTE — Telephone Encounter (Signed)
Requested medication (s) are due for refill today: yes  Requested medication (s) are on the active medication list: yes  Last refill:  11/07/19  Future visit scheduled: yes  Notes to clinic:  Unable to refill per protocol, last refill by another provider. Routing for review.     Requested Prescriptions  Pending Prescriptions Disp Refills   Lancet Devices (ONE TOUCH DELICA LANCING DEV) MISC 1 each 0    Sig: Use to check FSBS fasting daily. Dx: E11.59     Endocrinology: Diabetes - Testing Supplies Passed - 10/10/2022  1:05 PM      Passed - Valid encounter within last 12 months    Recent Outpatient Visits           1 month ago Type 2 diabetes mellitus with other circulatory complication, without long-term current use of insulin (HCC)   [place] [facility] at [facility], Lauris Poag, MD   8 months ago Type 2 diabetes mellitus with other circulatory complication, without long-term current use of insulin (HCC)   [place] [facility] at [facility], Lauris Poag, MD   1 year ago Type 2 diabetes mellitus with other circulatory complication, without long-term current use of insulin (HCC)   [place] [facility] at [facility], Lauris Poag, MD   1 year ago Type 2 diabetes mellitus with other circulatory complication, without long-term current use of insulin [facility])   [place] [facility] at [facility], Lauris Poag, MD   2 years ago Type 2 diabetes mellitus with other circulatory complication, without long-term current use of insulin [facility])    Primary Care at [facility], Kandee Keen, MD       Future Appointments             In 4 months Georganna Skeans, MD [facility]

## 2022-10-12 ENCOUNTER — Other Ambulatory Visit: Payer: Self-pay | Admitting: Family Medicine

## 2022-10-12 DIAGNOSIS — E1159 Type 2 diabetes mellitus with other circulatory complications: Secondary | ICD-10-CM

## 2022-10-20 ENCOUNTER — Other Ambulatory Visit: Payer: Self-pay | Admitting: Family Medicine

## 2022-10-20 DIAGNOSIS — E1159 Type 2 diabetes mellitus with other circulatory complications: Secondary | ICD-10-CM

## 2022-10-20 NOTE — Telephone Encounter (Signed)
Unable to refill per protocol, Rx request is too soon. Last refill 10/12/22 for 90 days.  Requested Prescriptions  Pending Prescriptions Disp Refills   glipiZIDE (GLUCOTROL XL) 10 MG 24 hr tablet [Pharmacy Med Name: GLIPIZIDE ER 10 MG TABLET] 90 tablet 0    Sig: TAKE 1 TABLET (10 MG TOTAL) BY MOUTH DAILY WITH BREAKFAST.     Endocrinology:  Diabetes - Sulfonylureas Failed - 10/20/2022  3:16 PM      Failed - HBA1C is between 0 and 7.9 and within 180 days    Hemoglobin A1C  Date Value Ref Range Status  02/13/2022 5.5 4.0 - 5.6 % Final   Hgb A1c MFr Bld  Date Value Ref Range Status  08/02/2020 6.3 (H) 4.8 - 5.6 % Final    Comment:             Prediabetes: 5.7 - 6.4          Diabetes: >6.4          Glycemic control for adults with diabetes: <7.0          Failed - Cr in normal range and within 360 days    Creatinine, Ser  Date Value Ref Range Status  03/13/2022 1.58 (H) 0.76 - 1.27 mg/dL Final         Passed - Valid encounter within last 6 months    Recent Outpatient Visits           2 months ago Type 2 diabetes mellitus with other circulatory complication, without long-term current use of insulin (HCC)   [place] [facility] at [facility], Lauris Poag, MD   8 months ago Type 2 diabetes mellitus with other circulatory complication, without long-term current use of insulin (HCC)   [place] [facility] at [facility], Lauris Poag, MD   1 year ago Type 2 diabetes mellitus with other circulatory complication, without long-term current use of insulin (HCC)   [place] [facility] at [facility], Lauris Poag, MD   1 year ago Type 2 diabetes mellitus with other circulatory complication, without long-term current use of insulin (HCC)   [place] [facility] at [facility], Lauris Poag, MD   3 years ago Type 2 diabetes mellitus with other circulatory complication, without long-term current use of insulin [facility])   [place] [facility] at [facility], Kandee Keen, MD       Future Appointments             In 3 months Georganna Skeans, MD [facility] at [facility]

## 2022-11-06 DIAGNOSIS — E119 Type 2 diabetes mellitus without complications: Secondary | ICD-10-CM | POA: Diagnosis not present

## 2022-11-06 DIAGNOSIS — H43813 Vitreous degeneration, bilateral: Secondary | ICD-10-CM | POA: Diagnosis not present

## 2022-11-06 DIAGNOSIS — H40033 Anatomical narrow angle, bilateral: Secondary | ICD-10-CM | POA: Diagnosis not present

## 2022-11-06 DIAGNOSIS — H2513 Age-related nuclear cataract, bilateral: Secondary | ICD-10-CM | POA: Diagnosis not present

## 2022-12-02 ENCOUNTER — Other Ambulatory Visit: Payer: Self-pay | Admitting: Family Medicine

## 2022-12-02 DIAGNOSIS — E1159 Type 2 diabetes mellitus with other circulatory complications: Secondary | ICD-10-CM

## 2022-12-29 ENCOUNTER — Other Ambulatory Visit: Payer: Self-pay | Admitting: Family Medicine

## 2022-12-29 DIAGNOSIS — Z1212 Encounter for screening for malignant neoplasm of rectum: Secondary | ICD-10-CM

## 2022-12-29 DIAGNOSIS — Z1211 Encounter for screening for malignant neoplasm of colon: Secondary | ICD-10-CM

## 2023-01-04 ENCOUNTER — Ambulatory Visit: Payer: Self-pay

## 2023-01-04 NOTE — Telephone Encounter (Signed)
    Chief Complaint: Productive cough x 2 weeks with yellow mucus, wheezing Symptoms: Above Frequency: 2 weeks Pertinent Negatives: Patient denies fever Disposition: [] ED /[] Urgent Care (no appt availability in office) / [x] Appointment(In office/virtual)/ []  [place] [facility]/ [] Home Care/ [] Refused Recommended Disposition /[]  Mobile Bus/ []  Follow-up with PCP Additional Notes: Pt. Agrees with appointment. Appointment per Q in the practice.  Reason for Disposition  [1] Continuous (nonstop) coughing interferes with work or school AND [2] no improvement using cough treatment per Care Advice  Answer Assessment - Initial Assessment Questions 1. ONSET: "When did the cough begin?"      2 weeks ago 2. SEVERITY: "How bad is the cough today?"      Moderate 3. SPUTUM: "Describe the color of your sputum" (none, dry cough; clear, white, yellow, green)     Yellow 4. HEMOPTYSIS: "Are you coughing up any blood?" If so ask: "How much?" (flecks, streaks, tablespoons, etc.)     No 5. DIFFICULTY BREATHING: "Are you having difficulty breathing?" If Yes, ask: "How bad is it?" (e.g., mild, moderate, severe)    - MILD: No SOB at rest, mild SOB with walking, speaks normally in sentences, can lie down, no retractions, pulse < 100.    - MODERATE: SOB at rest, SOB with minimal exertion and prefers to sit, cannot lie down flat, speaks in phrases, mild retractions, audible wheezing, pulse 100-120.    - SEVERE: Very SOB at rest, speaks in single words, struggling to breathe, sitting hunched forward, retractions, pulse > 120      No 6. FEVER: "Do you have a fever?" If Yes, ask: "What is your temperature, how was it measured, and when did it start?"     No 7. CARDIAC HISTORY: "Do you have any history of heart disease?" (e.g., heart attack, congestive heart failure)      Yes 8. LUNG HISTORY: "Do you have any history of lung disease?"  (e.g., pulmonary embolus, asthma, emphysema)     No 9. PE RISK  FACTORS: "Do you have a history of blood clots?" (or: recent major surgery, recent prolonged travel, bedridden)     No 10. OTHER SYMPTOMS: "Do you have any other symptoms?" (e.g., runny nose, wheezing, chest pain)       Wheezing 11. PREGNANCY: "Is there any chance you are pregnant?" "When was your last menstrual period?"       N/a 12. TRAVEL: "Have you traveled out of the country in the last month?" (e.g., travel history, exposures)       No  Protocols used: Cough - Acute Productive-A-AH

## 2023-01-05 ENCOUNTER — Telehealth: Payer: Self-pay | Admitting: Family Medicine

## 2023-01-05 ENCOUNTER — Ambulatory Visit (INDEPENDENT_AMBULATORY_CARE_PROVIDER_SITE_OTHER): Payer: BC Managed Care – PPO

## 2023-01-05 ENCOUNTER — Encounter: Payer: Self-pay | Admitting: Family Medicine

## 2023-01-05 ENCOUNTER — Ambulatory Visit (INDEPENDENT_AMBULATORY_CARE_PROVIDER_SITE_OTHER): Payer: BC Managed Care – PPO | Admitting: Family Medicine

## 2023-01-05 VITALS — BP 154/88 | HR 62 | Temp 98.2°F | Resp 18 | Ht 76.0 in | Wt 218.4 lb

## 2023-01-05 DIAGNOSIS — I10 Essential (primary) hypertension: Secondary | ICD-10-CM

## 2023-01-05 DIAGNOSIS — E1159 Type 2 diabetes mellitus with other circulatory complications: Secondary | ICD-10-CM

## 2023-01-05 DIAGNOSIS — R052 Subacute cough: Secondary | ICD-10-CM

## 2023-01-05 DIAGNOSIS — Z23 Encounter for immunization: Secondary | ICD-10-CM

## 2023-01-05 DIAGNOSIS — R053 Chronic cough: Secondary | ICD-10-CM | POA: Diagnosis not present

## 2023-01-05 DIAGNOSIS — Z7984 Long term (current) use of oral hypoglycemic drugs: Secondary | ICD-10-CM

## 2023-01-05 LAB — POCT GLYCOSYLATED HEMOGLOBIN (HGB A1C): Hemoglobin A1C: 5.6 % (ref 4.0–5.6)

## 2023-01-05 MED ORDER — METOPROLOL SUCCINATE ER 25 MG PO TB24: 25.0000 mg | ORAL_TABLET | Freq: Every day | ORAL | 1 refills | Status: DC

## 2023-01-05 MED ORDER — AZITHROMYCIN 250 MG PO TABS: ORAL_TABLET | ORAL | 0 refills | Status: AC

## 2023-01-05 NOTE — Progress Notes (Signed)
Established Patient Office Visit  Subjective    Patient ID: Adam [name], male    DOB: 30-Dec-1955  Age: 67 y.o. MRN: [id number]  CC:  Chief Complaint  Patient presents with   Cough    Congesting in chest, whizzing    HPI Adam [name] presents for complaint of persistent cought that is now productive for 2 weeks. Cough is also notably worse at night.   Outpatient Encounter Medications as of 01/05/2023  Medication Sig   aspirin EC 81 MG EC tablet Take 1 tablet (81 mg total) by mouth daily.   atorvastatin (LIPITOR) 40 MG tablet TAKE 1 TABLET BY MOUTH DAILY AT 6 PM.   azithromycin (ZITHROMAX) 250 MG tablet Take 2 tablets on day 1, then 1 tablet daily on days 2 through 5   Blood Glucose Monitoring Suppl (ONE TOUCH ULTRA 2) w/Device KIT Use to check FSBS fasting daily. Dx: E11.59   Blood Pressure KIT Check blood pressure once to twice daily. Notify PCP if readings greater than 160/100. ICD10 code-I10 dispense brand covered by health insurance plan   clopidogrel (PLAVIX) 75 MG tablet TAKE 1 TABLET BY MOUTH EVERY DAY   glipiZIDE (GLUCOTROL XL) 10 MG 24 hr tablet TAKE 1 TABLET (10 MG TOTAL) BY MOUTH DAILY WITH BREAKFAST.   glucose blood (ONETOUCH ULTRA) test strip USE TO TEST BLOOD SUGAR TWO TIMES A DAY   hydrochlorothiazide (HYDRODIURIL) 25 MG tablet TAKE 1 TABLET (25 MG TOTAL) BY MOUTH DAILY.   Lancet Devices (ONE TOUCH DELICA LANCING DEV) MISC Use to check FSBS fasting daily. Dx: E11.59   Lancets Misc. (ACCU-CHEK FASTCLIX LANCET) KIT Use to check FSBS fasting daily. Dx: E11.59   metFORMIN (GLUCOPHAGE) 500 MG tablet TAKE 1 TABLET BY MOUTH TWICE A DAY WITH FOOD   metoprolol succinate (TOPROL-XL) 25 MG 24 hr tablet Take 1 tablet (25 mg total) by mouth daily.   metoprolol tartrate (LOPRESSOR) 25 MG tablet Take 0.5 tablets (12.5 mg total) by mouth 2 (two) times daily.   Multiple Vitamins-Minerals (MULTI FOR HIM PO) Take by mouth. gummies with omega 3   nitroGLYCERIN (NITROSTAT) 0.4 MG  SL tablet PLACE 1 TABLET UNDER THE TONUGE EVERY 5 MINUTES AS NEEDED FOR CHEST PAIN   OneTouch Delica Lancets 33G MISC Use to check FSBS fasting daily. Dx: E11.59   potassium chloride (KLOR-CON M10) 10 MEQ tablet Take 1 tablet (10 mEq total) by mouth daily.   No facility-administered encounter medications on file as of 01/05/2023.    Past Medical History:  Diagnosis Date   Coronary artery disease    Diabetes mellitus without complication (HCC)    Myocardial infarction (HCC)    PCI/DES to pLCx, PTCA to PDA, small nondominent RCA with 95%   Occasional recreational drug use     Past Surgical History:  Procedure Laterality Date   CORONARY BALLOON ANGIOPLASTY N/A 02/05/2019   Procedure: CORONARY BALLOON ANGIOPLASTY;  Surgeon: Marykay Lex, MD;  Location: [facility] INVASIVE CV LAB;  Service: Cardiovascular;  Laterality: N/A;   CORONARY STENT INTERVENTION N/A 02/05/2019   Procedure: CORONARY STENT INTERVENTION;  Surgeon: Marykay Lex, MD;  Location: [facility] INVASIVE CV LAB;  Service: Cardiovascular;  Laterality: N/A;   CORONARY THROMBECTOMY N/A 02/05/2019   Procedure: Coronary Thrombectomy;  Surgeon: Marykay Lex, MD;  Location: [facility] INVASIVE CV LAB;  Service: Cardiovascular;  Laterality: N/A;   LEFT HEART CATH AND CORONARY ANGIOGRAPHY N/A 02/05/2019   Procedure: LEFT HEART CATH AND CORONARY ANGIOGRAPHY;  Surgeon: Bryan Lemma  W, MD;  Location: [facility] INVASIVE CV LAB;  Service: Cardiovascular;  Laterality: N/A;    Family History  Problem Relation Age of Onset   Heart disease Mother    Diabetes Father    Breast cancer Sister    Multiple sclerosis Sister    Stomach cancer Sister    Healthy Neg Hx     Social History   Socioeconomic History   Marital status: Single    Spouse name: Not on file   Number of children: 12   Years of education: 12   Highest education level: Not on file  Occupational History   Not on file  Tobacco Use   Smoking status: Never   Smokeless tobacco: Never   Vaping Use   Vaping status: Never Used  Substance and Sexual Activity   Alcohol use: Yes    Comment: SOCIAL   Drug use: Not Currently    Types: Cocaine   Sexual activity: Not on file  Other Topics Concern   Not on file  Social History Narrative   Not on file   Social Determinants of Health   Financial Resource Strain: Low Risk  (04/19/2022)   Overall Financial Resource Strain (CARDIA)    Difficulty of Paying Living Expenses: Not hard at all  Food Insecurity: No Food Insecurity (04/19/2022)   Hunger Vital Sign    Worried About Running Out of Food in the Last Year: Never true    Ran Out of Food in the Last Year: Never true  Transportation Needs: No Transportation Needs (04/19/2022)   PRAPARE - Administrator, Civil Service (Medical): No    Lack of Transportation (Non-Medical): No  Physical Activity: Inactive (04/19/2022)   Exercise Vital Sign    Days of Exercise per Week: 0 days    Minutes of Exercise per Session: 0 min  Stress: No Stress Concern Present (04/19/2022)   [employer] of Occupational Health - Occupational Stress Questionnaire    Feeling of Stress : Not at all  Social Connections: Socially Integrated (01/05/2023)   Social Connection and Isolation Panel [NHANES]    Frequency of Communication with Friends and Family: More than three times a week    Frequency of Social Gatherings with Friends and Family: Once a week    Attends Religious Services: More than 4 times per year    Active Member of [employer] or Organizations: Yes    Attends Banker Meetings: More than 4 times per year    Marital Status: Living with partner  Intimate Partner Violence: Not At Risk (01/05/2023)   Humiliation, Afraid, Rape, and Kick questionnaire    Fear of Current or Ex-Partner: No    Emotionally Abused: No    Physically Abused: No    Sexually Abused: No    Review of Systems  Constitutional:  Negative for fever.  Respiratory:  Positive for cough and sputum  production. Negative for shortness of breath.   All other systems reviewed and are negative.       Objective    BP (!) 154/88 (BP Location: Right Arm, Patient Position: Sitting, Cuff Size: Normal)   Pulse 62   Temp 98.2 F (36.8 C) (Oral)   Resp 18   Ht 6\' 4"  (1.93 m)   Wt 218 lb 6.4 oz (99.1 kg)   SpO2 94%   BMI 26.58 kg/m   Physical Exam Vitals and nursing note reviewed.  Constitutional:      General: He is not in acute distress. Cardiovascular:  Rate and Rhythm: Normal rate and regular rhythm.  Pulmonary:     Effort: Pulmonary effort is normal.     Breath sounds: Decreased breath sounds present. No wheezing.  Neurological:     General: No focal deficit present.     Mental Status: He is alert and oriented to person, place, and time.         Assessment & Plan:   1. Type 2 diabetes mellitus with other circulatory complication, without long-term current use of insulin (HCC) A1c is at goal. continue - HgB A1c  2. Subacute cough Referred for CXR. Zithromax prescribed - DG Chest 2 View; Future  3. Hypertension, unspecified type Slightly elevated readings. Will add Metoprolol ER 25mg  to regimen  4. Encounter for immunization  - Flu Vaccine Trivalent High Dose (Fluad)  Return in about 3 months (around 04/07/2023) for physical.   [name], MD

## 2023-01-05 NOTE — Telephone Encounter (Signed)
Patient stated he didn't want do colorguard and would prefer to have referral for colonoscopy

## 2023-01-09 ENCOUNTER — Other Ambulatory Visit: Payer: Self-pay | Admitting: Family Medicine

## 2023-01-09 DIAGNOSIS — Z1211 Encounter for screening for malignant neoplasm of colon: Secondary | ICD-10-CM

## 2023-01-13 ENCOUNTER — Other Ambulatory Visit: Payer: Self-pay | Admitting: Cardiovascular Disease

## 2023-02-13 ENCOUNTER — Other Ambulatory Visit: Payer: Self-pay | Admitting: Cardiovascular Disease

## 2023-02-13 ENCOUNTER — Other Ambulatory Visit: Payer: Self-pay | Admitting: Family Medicine

## 2023-02-14 ENCOUNTER — Ambulatory Visit: Payer: BC Managed Care – PPO | Admitting: Family Medicine

## 2023-02-14 ENCOUNTER — Encounter: Payer: Self-pay | Admitting: Family Medicine

## 2023-02-14 VITALS — BP 159/89 | HR 58 | Temp 97.6°F | Ht 76.0 in | Wt 233.4 lb

## 2023-02-14 DIAGNOSIS — I10 Essential (primary) hypertension: Secondary | ICD-10-CM

## 2023-02-14 DIAGNOSIS — Z1211 Encounter for screening for malignant neoplasm of colon: Secondary | ICD-10-CM

## 2023-02-14 DIAGNOSIS — R350 Frequency of micturition: Secondary | ICD-10-CM

## 2023-02-14 DIAGNOSIS — N401 Enlarged prostate with lower urinary tract symptoms: Secondary | ICD-10-CM | POA: Diagnosis not present

## 2023-02-14 DIAGNOSIS — Z23 Encounter for immunization: Secondary | ICD-10-CM | POA: Diagnosis not present

## 2023-02-14 MED ORDER — LISINOPRIL 5 MG PO TABS: 5.0000 mg | ORAL_TABLET | Freq: Every day | ORAL | 0 refills | Status: DC

## 2023-02-14 MED ORDER — TAMSULOSIN HCL 0.4 MG PO CAPS: 0.4000 mg | ORAL_CAPSULE | Freq: Every day | ORAL | 3 refills | Status: DC

## 2023-02-15 ENCOUNTER — Encounter: Payer: Self-pay | Admitting: Family Medicine

## 2023-02-15 NOTE — Progress Notes (Signed)
Established Patient Office Visit  Subjective    Patient ID: Adam [name], male    DOB: 04/06/1955  Age: 67 y.o. MRN: [id number]  CC:  Chief Complaint  Patient presents with   Follow-up    6 month    HPI Adam [name] presents for complaint of urinary frequency primarily at night. He also finds difficulty urinating.   Outpatient Encounter Medications as of 02/14/2023  Medication Sig   aspirin EC 81 MG EC tablet Take 1 tablet (81 mg total) by mouth daily.   atorvastatin (LIPITOR) 40 MG tablet TAKE 1 TABLET BY MOUTH DAILY AT 6 PM.   Blood Glucose Monitoring Suppl (ONE TOUCH ULTRA 2) w/Device KIT Use to check FSBS fasting daily. Dx: E11.59   Blood Pressure KIT Check blood pressure once to twice daily. Notify PCP if readings greater than 160/100. ICD10 code-I10 dispense brand covered by health insurance plan   glipiZIDE (GLUCOTROL XL) 10 MG 24 hr tablet TAKE 1 TABLET (10 MG TOTAL) BY MOUTH DAILY WITH BREAKFAST.   glucose blood (ONETOUCH ULTRA) test strip USE TO TEST BLOOD SUGAR TWO TIMES A DAY   hydrochlorothiazide (HYDRODIURIL) 25 MG tablet TAKE 1 TABLET (25 MG TOTAL) BY MOUTH DAILY.   KLOR-CON M10 10 MEQ tablet TAKE 1 TABLET BY MOUTH EVERY DAY   Lancet Devices (ONE TOUCH DELICA LANCING DEV) MISC Use to check FSBS fasting daily. Dx: E11.59   Lancets Misc. (ACCU-CHEK FASTCLIX LANCET) KIT Use to check FSBS fasting daily. Dx: E11.59   lisinopril (ZESTRIL) 5 MG tablet Take 1 tablet (5 mg total) by mouth daily.   metoprolol succinate (TOPROL-XL) 25 MG 24 hr tablet Take 1 tablet (25 mg total) by mouth daily.   metoprolol tartrate (LOPRESSOR) 25 MG tablet Take 0.5 tablets (12.5 mg total) by mouth 2 (two) times daily.   Multiple Vitamins-Minerals (MULTI FOR HIM PO) Take by mouth. gummies with omega 3   nitroGLYCERIN (NITROSTAT) 0.4 MG SL tablet PLACE 1 TABLET UNDER THE TONUGE EVERY 5 MINUTES AS NEEDED FOR CHEST PAIN   OneTouch Delica Lancets 33G MISC Use to check FSBS fasting daily. Dx:  E11.59   tamsulosin (FLOMAX) 0.4 MG CAPS capsule Take 1 capsule (0.4 mg total) by mouth daily.   [DISCONTINUED] clopidogrel (PLAVIX) 75 MG tablet TAKE 1 TABLET BY MOUTH EVERY DAY   [DISCONTINUED] metFORMIN (GLUCOPHAGE) 500 MG tablet TAKE 1 TABLET BY MOUTH TWICE A DAY WITH FOOD   No facility-administered encounter medications on file as of 02/14/2023.    Past Medical History:  Diagnosis Date   Coronary artery disease    Diabetes mellitus without complication (HCC)    Myocardial infarction (HCC)    PCI/DES to pLCx, PTCA to PDA, small nondominent RCA with 95%   Occasional recreational drug use     Past Surgical History:  Procedure Laterality Date   CORONARY BALLOON ANGIOPLASTY N/A 02/05/2019   Procedure: CORONARY BALLOON ANGIOPLASTY;  Surgeon: Marykay Lex, MD;  Location: [facility] INVAS[facility] CV LAB;  Service: Cardiovascular;  Laterality: N/A;   CORONARY STENT INTERVENTION N/A 02/05/2019   Procedure: CORONARY STENT INTERVENTION;  Surgeon: Marykay Lex, MD;  Location: [facility] INVASIVE CV LAB;  Service: Cardiovascular;  Laterality: N/A;   CORONARY THROMBECTOMY N/A 02/05/2019   Procedure: Coronary Thrombectomy;  Surgeon: Marykay Lex, MD;  Location: [facility] INVASIVE CV LAB;  Service: Cardiovascular;  Laterality: N/A;   LEFT HEART CATH AND CORONARY ANGIOGRAPHY N/A 02/05/2019   Procedure: LEFT HEART CATH AND CORONARY ANGIOGRAPHY;  Surgeon: Herbie Baltimore,  Piedad Climes, MD;  Location: [facility] INVASIVE CV LAB;  Service: Cardiovascular;  Laterality: N/A;    Family History  Problem Relation Age of Onset   Heart disease Mother    Diabetes Father    Breast cancer Sister    Multiple sclerosis Sister    Stomach cancer Sister    Healthy Neg Hx     Social History   Socioeconomic History   Marital status: Single    Spouse name: Not on file   Number of children: 12   Years of education: 12   Highest education level: Not on file  Occupational History   Not on file  Tobacco Use   Smoking status: Never    Smokeless tobacco: Never  Vaping Use   Vaping status: Never Used  Substance and Sexual Activity   Alcohol use: Yes    Comment: SOCIAL   Drug use: Not Currently    Types: Cocaine   Sexual activity: Not on file  Other Topics Concern   Not on file  Social History Narrative   Not on file   Social Determinants of Health   Financial Resource Strain: Low Risk  (04/19/2022)   Overall Financial Resource Strain (CARDIA)    Difficulty of Paying Living Expenses: Not hard at all  Food Insecurity: No Food Insecurity (04/19/2022)   Hunger Vital Sign    Worried About Running Out of Food in the Last Year: Never true    Ran Out of Food in the Last Year: Never true  Transportation Needs: No Transportation Needs (04/19/2022)   PRAPARE - Administrator, Civil Service (Medical): No    Lack of Transportation (Non-Medical): No  Physical Activity: Inactive (04/19/2022)   Exercise Vital Sign    Days of Exercise per Week: 0 days    Minutes of Exercise per Session: 0 min  Stress: No Stress Concern Present (04/19/2022)   [employer] of Occupational Health - Occupational Stress Questionnaire    Feeling of Stress : Not at all  Social Connections: Socially Integrated (01/05/2023)   Social Connection and Isolation Panel [NHANES]    Frequency of Communication with Friends and Family: More than three times a week    Frequency of Social Gatherings with Friends and Family: Once a week    Attends Religious Services: More than 4 times per year    Active Member of [employer] or Organizations: Yes    Attends Banker Meetings: More than 4 times per year    Marital Status: Living with partner  Intimate Partner Violence: Not At Risk (01/05/2023)   Humiliation, Afraid, Rape, and Kick questionnaire    Fear of Current or Ex-Partner: No    Emotionally Abused: No    Physically Abused: No    Sexually Abused: No    Review of Systems  Genitourinary:  Positive for frequency.  All other systems  reviewed and are negative.       Objective    BP (!) 159/89 (BP Location: Right Arm, Patient Position: Sitting, Cuff Size: Normal)   Pulse (!) 58   Temp 97.6 F (36.4 C) (Oral)   Ht 6\' 4"  (1.93 m)   Wt 233 lb 6.4 oz (105.9 kg)   SpO2 95%   BMI 28.41 kg/m   Physical Exam Vitals and nursing note reviewed.  Constitutional:      General: He is not in acute distress. Cardiovascular:     Rate and Rhythm: Normal rate and regular rhythm.  Pulmonary:  Effort: Pulmonary effort is normal.     Breath sounds: Normal breath sounds.  Abdominal:     Palpations: Abdomen is soft.     Tenderness: There is no abdominal tenderness.  Neurological:     General: No focal deficit present.     Mental Status: He is alert and oriented to person, place, and time.         Assessment & Plan:   Benign prostatic hyperplasia with urinary frequency  Essential hypertension  Screening for colon cancer -     Ambulatory referral to Gastroenterology  Immunization due -     Tdap vaccine greater than or equal to 7yo IM  Other orders -     Lisinopril; Take 1 tablet (5 mg total) by mouth daily.  Dispense: 90 tablet; Refill: 0 -     Tamsulosin HCl; Take 1 capsule (0.4 mg total) by mouth daily.  Dispense: 30 capsule; Refill: 3     Return in about 6 weeks (around 03/28/2023).   [name], MD

## 2023-02-17 ENCOUNTER — Other Ambulatory Visit: Payer: Self-pay | Admitting: Family Medicine

## 2023-02-26 ENCOUNTER — Encounter: Payer: Self-pay | Admitting: Gastroenterology

## 2023-04-12 ENCOUNTER — Other Ambulatory Visit: Payer: Self-pay | Admitting: Family Medicine

## 2023-04-12 DIAGNOSIS — E1159 Type 2 diabetes mellitus with other circulatory complications: Secondary | ICD-10-CM

## 2023-05-10 ENCOUNTER — Ambulatory Visit: Payer: BC Managed Care – PPO | Admitting: Gastroenterology

## 2023-05-10 ENCOUNTER — Encounter: Payer: Self-pay | Admitting: Gastroenterology

## 2023-05-10 ENCOUNTER — Ambulatory Visit: Payer: BC Managed Care – PPO

## 2023-05-10 ENCOUNTER — Telehealth: Payer: Self-pay

## 2023-05-10 VITALS — BP 130/80 | HR 65 | Ht 76.0 in | Wt 229.0 lb

## 2023-05-10 DIAGNOSIS — Z0000 Encounter for general adult medical examination without abnormal findings: Secondary | ICD-10-CM

## 2023-05-10 DIAGNOSIS — I2510 Atherosclerotic heart disease of native coronary artery without angina pectoris: Secondary | ICD-10-CM

## 2023-05-10 DIAGNOSIS — Z7901 Long term (current) use of anticoagulants: Secondary | ICD-10-CM

## 2023-05-10 DIAGNOSIS — Z1211 Encounter for screening for malignant neoplasm of colon: Secondary | ICD-10-CM

## 2023-05-10 MED ORDER — NA SULFATE-K SULFATE-MG SULF 17.5-3.13-1.6 GM/177ML PO SOLN: 1.0000 | Freq: Once | ORAL | 0 refills | Status: AC

## 2023-05-10 NOTE — Progress Notes (Signed)
Chief Complaint: Screening colonoscopy Primary GI MD: Gentry Fitz  HPI: 68 year old male history of CAD s/p NSTEMI 01/2019 with three-vessel CAD treated with DES and balloon angioplasty (on Plavix), polysubstance abuse (cocaine), diabetes type 2, hyperlipidemia, presents for evaluation of screening colonoscopy.  Follows with Dr. Excell Seltzer cardiology.  Referred here by PCP for screening colonoscopy.  No history of previous screening colonoscopy  Last labs show normal CMP  Patient states he is here today for his for screening colonoscopy.  States he has a brother with "colon issues" but is unaware of any colon cancer in his family.  Patient denies GI symptoms today including weight loss, change in bowel habits, rectal bleeding, nausea, vomiting.  No previous history of colonoscopy.   PREVIOUS GI WORKUP   Echocardiogram 01/2019 with EF 50 to 55%  Past Medical History:  Diagnosis Date   Coronary artery disease    Diabetes mellitus without complication (HCC)    Myocardial infarction (HCC)    PCI/DES to pLCx, PTCA to PDA, small nondominent RCA with 95%   Occasional recreational drug use     Past Surgical History:  Procedure Laterality Date   CORONARY BALLOON ANGIOPLASTY N/A 02/05/2019   Procedure: CORONARY BALLOON ANGIOPLASTY;  Surgeon: Marykay Lex, MD;  Location: [facility] INVASIVE CV LAB;  Service: Cardiovascular;  Laterality: N/A;   CORONARY STENT INTERVENTION N/A 02/05/2019   Procedure: CORONARY STENT INTERVENTION;  Surgeon: Marykay Lex, MD;  Location: [facility] INVASIVE CV LAB;  Service: Cardiovascular;  Laterality: N/A;   CORONARY THROMBECTOMY N/A 02/05/2019   Procedure: Coronary Thrombectomy;  Surgeon: Marykay Lex, MD;  Location: [facility] INVASIVE CV LAB;  Service: Cardiovascular;  Laterality: N/A;   LEFT HEART CATH AND CORONARY ANGIOGRAPHY N/A 02/05/2019   Procedure: LEFT HEART CATH AND CORONARY ANGIOGRAPHY;  Surgeon: Marykay Lex, MD;  Location: [facility] INVASIVE CV LAB;  Service:  Cardiovascular;  Laterality: N/A;    Current Outpatient Medications  Medication Sig Dispense Refill   aspirin EC 81 MG EC tablet Take 1 tablet (81 mg total) by mouth daily. 90 tablet 3   atorvastatin (LIPITOR) 40 MG tablet TAKE 1 TABLET BY MOUTH DAILY AT 6 PM. 90 tablet 3   Blood Glucose Monitoring Suppl (ONE TOUCH ULTRA 2) w/Device KIT Use to check FSBS fasting daily. Dx: E11.59 1 kit 0   Blood Pressure KIT Check blood pressure once to twice daily. Notify PCP if readings greater than 160/100. ICD10 code-I10 dispense brand covered by health insurance plan 1 kit 0   clopidogrel (PLAVIX) 75 MG tablet TAKE 1 TABLET BY MOUTH EVERY DAY 90 tablet 2   glipiZIDE (GLUCOTROL XL) 10 MG 24 hr tablet TAKE 1 TABLET (10 MG TOTAL) BY MOUTH DAILY WITH BREAKFAST. 90 tablet 0   glucose blood (ONETOUCH ULTRA) test strip USE TO TEST BLOOD SUGAR TWO TIMES A DAY 100 strip 3   hydrochlorothiazide (HYDRODIURIL) 25 MG tablet TAKE 1 TABLET (25 MG TOTAL) BY MOUTH DAILY. 90 tablet 3   Lancet Devices (ONE TOUCH DELICA LANCING DEV) MISC Use to check FSBS fasting daily. Dx: E11.59 1 each 0   Lancets Misc. (ACCU-CHEK FASTCLIX LANCET) KIT Use to check FSBS fasting daily. Dx: E11.59 1 kit 0   lisinopril (ZESTRIL) 5 MG tablet Take 1 tablet (5 mg total) by mouth daily. 90 tablet 0   metFORMIN (GLUCOPHAGE) 500 MG tablet TAKE 1 TABLET BY MOUTH TWICE A DAY WITH FOOD 180 tablet 1   metoprolol succinate (TOPROL-XL) 25 MG 24 hr tablet Take 1 tablet (  25 mg total) by mouth daily. 90 tablet 1   Multiple Vitamins-Minerals (MULTI FOR HIM PO) Take by mouth. gummies with omega 3     Na Sulfate-K Sulfate-Mg Sulfate concentrate 17.5-3.13-1.6 GM/177ML SOLN Take 1 kit by mouth once for 1 dose. 354 mL 0   nitroGLYCERIN (NITROSTAT) 0.4 MG SL tablet PLACE 1 TABLET UNDER THE TONUGE EVERY 5 MINUTES AS NEEDED FOR CHEST PAIN 75 tablet 3   OneTouch Delica Lancets 33G MISC Use to check FSBS fasting daily. Dx: E11.59 100 each 1   tamsulosin (FLOMAX) 0.4 MG  CAPS capsule Take 1 capsule (0.4 mg total) by mouth daily. 30 capsule 3   KLOR-CON M10 10 MEQ tablet TAKE 1 TABLET BY MOUTH EVERY DAY 90 tablet 3   metoprolol tartrate (LOPRESSOR) 25 MG tablet Take 0.5 tablets (12.5 mg total) by mouth 2 (two) times daily. 90 tablet 3   No current facility-administered medications for this visit.    Allergies as of 05/10/2023   (No Known Allergies)    Family History  Problem Relation Age of Onset   Heart disease Mother    Diabetes Father    Breast cancer Sister    Multiple sclerosis Sister    Stomach cancer Sister    Healthy Neg Hx     Social History   Socioeconomic History   Marital status: Single    Spouse name: Not on file   Number of children: 12   Years of education: 12   Highest education level: Not on file  Occupational History   Not on file  Tobacco Use   Smoking status: Never   Smokeless tobacco: Never  Vaping Use   Vaping status: Never Used  Substance and Sexual Activity   Alcohol use: Yes    Comment: SOCIAL   Drug use: Not Currently    Types: Cocaine   Sexual activity: Not on file  Other Topics Concern   Not on file  Social History Narrative   Not on file   Social Drivers of Health   Financial Resource Strain: Low Risk  (04/19/2022)   Overall Financial Resource Strain (CARDIA)    Difficulty of Paying Living Expenses: Not hard at all  Food Insecurity: No Food Insecurity (04/19/2022)   Hunger Vital Sign    Worried About Running Out of Food in the Last Year: Never true    Ran Out of Food in the Last Year: Never true  Transportation Needs: No Transportation Needs (04/19/2022)   PRAPARE - Administrator, Civil Service (Medical): No    Lack of Transportation (Non-Medical): No  Physical Activity: Inactive (04/19/2022)   Exercise Vital Sign    Days of Exercise per Week: 0 days    Minutes of Exercise per Session: 0 min  Stress: No Stress Concern Present (04/19/2022)   [employer] of Occupational Health -  Occupational Stress Questionnaire    Feeling of Stress : Not at all  Social Connections: Socially Integrated (01/05/2023)   Social Connection and Isolation Panel [NHANES]    Frequency of Communication with Friends and Family: More than three times a week    Frequency of Social Gatherings with Friends and Family: Once a week    Attends Religious Services: More than 4 times per year    Active Member of [employer] or Organizations: Yes    Attends Banker Meetings: More than 4 times per year    Marital Status: Living with partner  Intimate Partner Violence: Not At Risk (01/05/2023)  Humiliation, Afraid, Rape, and Kick questionnaire    Fear of Current or Ex-Partner: No    Emotionally Abused: No    Physically Abused: No    Sexually Abused: No    Review of Systems:    Constitutional: No weight loss, fever, chills, weakness or fatigue HEENT: Eyes: No change in vision               Ears, Nose, Throat:  No change in hearing or congestion Skin: No rash or itching Cardiovascular: No chest pain, chest pressure or palpitations   Respiratory: No SOB or cough Gastrointestinal: See HPI and otherwise negative Genitourinary: No dysuria or change in urinary frequency Neurological: No headache, dizziness or syncope Musculoskeletal: No new muscle or joint pain Hematologic: No bleeding or bruising Psychiatric: No history of depression or anxiety    Physical Exam:  Vital signs: BP 130/80 (BP Location: Left Arm, Patient Position: Sitting, Cuff Size: Normal)   Pulse 65   Ht 6\' 4"  (1.93 m)   Wt 229 lb (103.9 kg)   SpO2 95%   BMI 27.87 kg/m   Constitutional: NAD, Well developed, Well nourished, alert and cooperative Head:  Normocephalic and atraumatic. Eyes:   PEERL, EOMI. No icterus. Conjunctiva pink. Respiratory: Respirations even and unlabored. Lungs clear to auscultation bilaterally.   No wheezes, crackles, or rhonchi.  Cardiovascular:  Regular rate and rhythm. No peripheral edema,  cyanosis or pallor.  Gastrointestinal:  Soft, nondistended, nontender. No rebound or guarding. Normal bowel sounds. No appreciable masses or hepatomegaly. Rectal:  Not performed.  Msk:  Symmetrical without gross deformities. Without edema, no deformity or joint abnormality.  Neurologic:  Alert and  oriented x4;  grossly normal neurologically.  Skin:   Dry and intact without significant lesions or rashes. Psychiatric: Oriented to person, place and time. Demonstrates good judgement and reason without abnormal affect or behaviors.  RELEVANT LABS AND IMAGING: CBC    Component Value Date/Time   WBC 5.2 08/02/2020 1051   WBC 9.6 02/06/2019 0350   RBC 4.52 08/02/2020 1051   RBC 4.35 02/06/2019 0350   HGB 12.6 (L) 08/02/2020 1051   HCT 39.4 08/02/2020 1051   PLT 181 08/02/2020 1051   MCV 87 08/02/2020 1051   MCH 27.9 08/02/2020 1051   MCH 28.0 02/06/2019 0350   MCHC 32.0 08/02/2020 1051   MCHC 31.7 02/06/2019 0350   RDW 11.2 (L) 08/02/2020 1051   LYMPHSABS 2.3 03/04/2019 0859   EOSABS 0.2 03/04/2019 0859   BASOSABS 0.0 03/04/2019 0859    CMP     Component Value Date/Time   NA 141 03/13/2022 0910   K 4.5 03/13/2022 0910   CL 104 03/13/2022 0910   CO2 24 03/13/2022 0910   GLUCOSE 138 (H) 03/13/2022 0910   GLUCOSE 189 (H) 02/06/2019 0350   BUN 18 03/13/2022 0910   CREATININE 1.58 (H) 03/13/2022 0910   CALCIUM 9.7 03/13/2022 0910   PROT 7.0 03/13/2022 0910   ALBUMIN 4.4 03/13/2022 0910   AST 12 03/13/2022 0910   ALT 15 03/13/2022 0910   ALKPHOS 77 03/13/2022 0910   BILITOT 1.0 03/13/2022 0910   GFRNONAA 76 06/18/2019 0000   GFRAA 88 06/18/2019 0000     Assessment/Plan:   Screening for colon cancer No previous colonoscopy.  No family history of colonoscopy.  No GI issues today. - Schedule colonoscopy - I thoroughly discussed the procedure with the patient (at bedside) to include nature of the procedure, alternatives, benefits, and risks (including but not limited to  bleeding, infection, perforation, anesthesia/cardiac pulmonary complications).  Patient verbalized understanding and gave verbal consent to proceed with procedure. - Will hold Plavix 5 days prior to endoscopic procedures - will instruct when and how to resume after procedure. Benefits and risks of procedure explained including risks of bleeding, perforation, infection, missed lesions, reactions to medications and possible need for hospitalization and surgery for complications. Additional rare but real risk of stroke or other vascular clotting events off Plavix also explained and need to seek urgent help if any signs of these problems occur. Will communicate by phone or EMR with patient's  prescribing provider to confirm that holding Plavix is reasonable in this case.    Long-term anticoagulation CAD s/p NSTEMI 01/2019 with three-vessel CAD treated with DES and balloon angioplasty (on Plavix) LEC candidate with recent EF 50 to 55% Will hold Plavix    Naser Schuld Jolee Ewing [facility] 05/10/2023, 9:15 AM  Cc: Georganna Skeans, MD

## 2023-05-10 NOTE — Progress Notes (Signed)
 Subjective:   Adam [name] is a 68 y.o. male who presents for Medicare Annual/Subsequent preventive examination.  Visit Complete: Virtual I connected with  Adam [name] on 05/10/23 by a audio enabled telemedicine application and verified that I am speaking with the correct person using two identifiers. Interactive audio and video telecommunications were attempted between this provider and patient, however failed, due to patient having technical difficulties OR patient did not have access to video capability.  We continued and completed visit with audio only.  Patient Location: Home  Provider Location: Home Office  I discussed the limitations of evaluation and management by telemedicine. The patient expressed understanding and agreed to proceed.  Vital Signs: Because this visit was a virtual/telehealth visit, some criteria may be missing or patient reported. Any vitals not documented were not able to be obtained and vitals that have been documented are patient reported.    Cardiac Risk Factors include: advanced age (>23men, >89 women);diabetes mellitus;dyslipidemia;hypertension;male gender     Objective:    Today's Vitals   There is no height or weight on file to calculate BMI.     05/10/2023    3:42 PM 04/19/2022    4:30 PM 02/06/2019   10:13 AM  Advanced Directives  Does Patient Have a Medical Advance Directive? Yes Yes No  Type of Estate agent of [place];Living will Healthcare Power of [place];Living will   Copy of Healthcare Power of Attorney in Chart? No - copy requested No - copy requested   Would patient like information on creating a medical advance directive?   No - Patient declined    Current Medications (verified) Outpatient Encounter Medications as of 05/10/2023  Medication Sig   aspirin EC 81 MG EC tablet Take 1 tablet (81 mg total) by mouth daily.   atorvastatin (LIPITOR) 40 MG tablet TAKE 1 TABLET BY MOUTH DAILY AT 6 PM.   Blood  Glucose Monitoring Suppl (ONE TOUCH ULTRA 2) w/Device KIT Use to check FSBS fasting daily. Dx: E11.59   Blood Pressure KIT Check blood pressure once to twice daily. Notify PCP if readings greater than 160/100. ICD10 code-I10 dispense brand covered by health insurance plan   clopidogrel (PLAVIX) 75 MG tablet TAKE 1 TABLET BY MOUTH EVERY DAY   glipiZIDE (GLUCOTROL XL) 10 MG 24 hr tablet TAKE 1 TABLET (10 MG TOTAL) BY MOUTH DAILY WITH BREAKFAST.   glucose blood (ONETOUCH ULTRA) test strip USE TO TEST BLOOD SUGAR TWO TIMES A DAY   hydrochlorothiazide (HYDRODIURIL) 25 MG tablet TAKE 1 TABLET (25 MG TOTAL) BY MOUTH DAILY.   KLOR-CON M10 10 MEQ tablet TAKE 1 TABLET BY MOUTH EVERY DAY   Lancet Devices (ONE TOUCH DELICA LANCING DEV) MISC Use to check FSBS fasting daily. Dx: E11.59   Lancets Misc. (ACCU-CHEK FASTCLIX LANCET) KIT Use to check FSBS fasting daily. Dx: E11.59   lisinopril (ZESTRIL) 5 MG tablet Take 1 tablet (5 mg total) by mouth daily.   metFORMIN (GLUCOPHAGE) 500 MG tablet TAKE 1 TABLET BY MOUTH TWICE A DAY WITH FOOD   metoprolol succinate (TOPROL-XL) 25 MG 24 hr tablet Take 1 tablet (25 mg total) by mouth daily.   metoprolol tartrate (LOPRESSOR) 25 MG tablet Take 0.5 tablets (12.5 mg total) by mouth 2 (two) times daily.   Multiple Vitamins-Minerals (MULTI FOR HIM PO) Take by mouth. gummies with omega 3   Na Sulfate-K Sulfate-Mg Sulfate concentrate 17.5-3.13-1.6 GM/177ML SOLN Take 1 kit by mouth once for 1 dose.   nitroGLYCERIN (NITROSTAT)  0.4 MG SL tablet PLACE 1 TABLET UNDER THE TONUGE EVERY 5 MINUTES AS NEEDED FOR CHEST PAIN   OneTouch Delica Lancets 33G MISC Use to check FSBS fasting daily. Dx: E11.59   tamsulosin (FLOMAX) 0.4 MG CAPS capsule Take 1 capsule (0.4 mg total) by mouth daily.   No facility-administered encounter medications on file as of 05/10/2023.    Allergies (verified) Patient has no known allergies.   History: Past Medical History:  Diagnosis Date   Coronary artery  disease    Diabetes mellitus without complication (HCC)    Myocardial infarction (HCC)    PCI/DES to pLCx, PTCA to PDA, small nondominent RCA with 95%   Occasional recreational drug use    Past Surgical History:  Procedure Laterality Date   CORONARY BALLOON ANGIOPLASTY N/A 02/05/2019   Procedure: CORONARY BALLOON ANGIOPLASTY;  Surgeon: Marykay Lex, MD;  Location[facility] INVASIVE CV LAB;  Service: Cardiovascular;  Laterality: N/A;   CORONARY STENT INTERVENTION N/A 02/05/2019   Procedure: CORONARY STENT INTERVENTION;  Surgeon: Marykay Lex, MD;  Location: [facility] INVASIVE CV LAB;  Service: Cardiovascular;  Laterality: N/A;   CORONARY THROMBECTOMY N/A 02/05/2019   Procedure: Coronary Thrombectomy;  Surgeon: Marykay Lex, MD;  Location: [facility] INVASIVE CV LAB;  Service: Cardiovascular;  Laterality: N/A;   LEFT HEART CATH AND CORONARY ANGIOGRAPHY N/A 02/05/2019   Procedure: LEFT HEART CATH AND CORONARY ANGIOGRAPHY;  Surgeon: Marykay Lex, MD;  Location: [facility] INVASIVE CV LAB;  Service: Cardiovascular;  Laterality: N/A;   Family History  Problem Relation Age of Onset   Heart disease Mother    Diabetes Father    Breast cancer Sister    Multiple sclerosis Sister    Stomach cancer Sister    Healthy Neg Hx    Social History   Socioeconomic History   Marital status: Single    Spouse name: Not on file   Number of children: 12   Years of education: 12   Highest education level: Not on file  Occupational History   Not on file  Tobacco Use   Smoking status: Never   Smokeless tobacco: Never  Vaping Use   Vaping status: Never Used  Substance and Sexual Activity   Alcohol use: Yes    Comment: SOCIAL   Drug use: Not Currently    Types: Cocaine   Sexual activity: Not on file  Other Topics Concern   Not on file  Social History Narrative   Not on file   Social Drivers of Health   Financial Resource Strain: Low Risk  (05/10/2023)   Overall Financial Resource Strain (CARDIA)     Difficulty of Paying Living Expenses: Not hard at all  Food Insecurity: No Food Insecurity (05/10/2023)   Hunger Vital Sign    Worried About Running Out of Food in the Last Year: Never true    Ran Out of Food in the Last Year: Never true  Transportation Needs: No Transportation Needs (05/10/2023)   PRAPARE - Administrator, Civil Service (Medical): No    Lack of Transportation (Non-Medical): No  Physical Activity: Inactive (05/10/2023)   Exercise Vital Sign    [name] of Exercise per Week: 0 [name]    Minutes of Exercise per Session: 0 min  Stress: No Stress Concern Present (05/10/2023)   [employer] of Occupational Health - Occupational Stress Questionnaire    Feeling of Stress : Not at all  Social Connections: Moderately Isolated (05/10/2023)   Social Connection and Isolation Panel [NHANES]  Frequency of Communication with Friends and Family: More than three times a week    Frequency of Social Gatherings with Friends and Family: Once a week    Attends Religious Services: More than 4 times per year    Active Member of [employer] or Organizations: No    Attends Engineer, structural: Never    Marital Status: Never married    Tobacco Counseling Counseling given: Not Answered   Clinical Intake:  Pre-visit preparation completed: Yes  Pain : No/denies pain     Nutritional Risks: None Diabetes: Yes CBG done?: No Did pt. bring in CBG monitor from home?: No  How often do you need to have someone help you when you read instructions, pamphlets, or other written materials from your doctor or pharmacy?: 1 - Never  Interpreter Needed?: No  Information entered by :: NAllen LPN   Activities of Daily Living    05/10/2023    3:38 PM  In your present state of health, do you have any difficulty performing the following activities:  Hearing? 0  Vision? 0  Difficulty concentrating or making decisions? 0  Walking or climbing stairs? 0  Dressing or bathing? 0  Doing  errands, shopping? 0  Preparing Food and eating ? N  Using the Toilet? N  In the past six months, have you accidently leaked urine? N  Do you have problems with loss of bowel control? N  Managing your Medications? N  Managing your Finances? N  Housekeeping or managing your Housekeeping? N    Patient Care Team: Georganna Skeans, MD as PCP - General (Family Medicine) Tonny Bollman, MD as PCP - Cardiology (Cardiology)  Indicate any recent Medical Services you may have received from other than [facility] providers in the past year (date may be approximate).     Assessment:   This is a routine wellness examination for Marissa.  Hearing/Vision screen Hearing Screening - Comments:: Denies hearing issues Vision Screening - Comments:: Regular eye exams, [facility]   Goals Addressed             This Visit's Progress    Patient Stated       05/10/2023, denies goals       Depression Screen    05/10/2023    3:44 PM 02/14/2023    9:41 AM 01/05/2023    9:23 AM 04/19/2022    4:30 PM 02/13/2022    9:52 AM 08/17/2021   11:09 AM 04/28/2021    9:28 AM  PHQ 2/9 Scores  PHQ - 2 Score 0 0 0 0 0 0 0  PHQ- 9 Score 0    0 0 0    Fall Risk    05/10/2023    3:43 PM 01/05/2023    9:23 AM 04/19/2022    4:30 PM 04/28/2021    9:29 AM 06/13/2019   11:22 AM  Fall Risk   Falls in the past year? 0 0 0 0 0  Number falls in past yr: 0 0 0 0   Injury with Fall? 0 0 0 0   Risk for fall due to : Medication side effect No Fall Risks Medication side effect    Follow up Falls prevention discussed;Falls evaluation completed Falls evaluation completed Falls prevention discussed;Education provided;Falls evaluation completed      MEDICARE RISK AT HOME: Medicare Risk at Home Any stairs in or around the home?: Yes If so, are there any without handrails?: Yes Home free of loose throw rugs in walkways, pet beds,  electrical cords, etc?: Yes Adequate lighting in your home to reduce risk of falls?: Yes Life  alert?: No Use of a cane, walker or w/c?: No Grab bars in the bathroom?: No Shower chair or bench in shower?: No Elevated toilet seat or a handicapped toilet?: No  TIMED UP AND GO:  Was the test performed?  No    Cognitive Function:        05/10/2023    3:44 PM 04/19/2022    4:31 PM  6CIT Screen  What Year? 0 points 0 points  What month? 0 points 0 points  What time? 0 points 0 points  Count back from 20 0 points 0 points  Months in reverse 0 points 0 points  Repeat phrase 2 points 0 points  Total Score 2 points 0 points    Immunizations Immunization History  Administered Date(s) Administered   Fluad Quad(high Dose 65+) 04/28/2021, 02/13/2022   Fluad Trivalent(High Dose 65+) 01/05/2023   Influenza,inj,Quad PF,6+ Mos 02/06/2019   Moderna SARS-COV2 Booster Vaccination 01/23/2020   Moderna Sars-Covid-2 Vaccination 05/05/2019, 06/05/2019, 10/17/2019, 01/23/2020   Tdap 02/14/2023    TDAP status: Up to date  Flu Vaccine status: Up to date  Pneumococcal vaccine status: Due, Education has been provided regarding the importance of this vaccine. Advised may receive this vaccine at local pharmacy or Health Dept. Aware to provide a copy of the vaccination record if obtained from local pharmacy or Health Dept. Verbalized acceptance and understanding.  Covid-19 vaccine status: Information provided on how to obtain vaccines.   Qualifies for Shingles Vaccine? Yes   Zostavax completed No   Shingrix Completed?: No.    Education has been provided regarding the importance of this vaccine. Patient has been advised to call insurance company to determine out of pocket expense if they have not yet received this vaccine. Advised may also receive vaccine at local pharmacy or Health Dept. Verbalized acceptance and understanding.  Screening Tests Health Maintenance  Topic Date Due   Pneumonia Vaccine 86+ Years old (1 of 2 - PCV) Never done   Hepatitis C Screening  Never done   Fecal DNA  (Cologuard)  Never done   Zoster Vaccines- Shingrix (1 of 2) Never done   Diabetic kidney evaluation - eGFR measurement  03/14/2023   HEMOGLOBIN A1C  07/06/2023   Diabetic kidney evaluation - Urine ACR  08/14/2023   FOOT EXAM  08/14/2023   OPHTHALMOLOGY EXAM  11/17/2023   Medicare Annual Wellness (AWV)  05/09/2024   DTaP/Tdap/Td (2 - Td or Tdap) 02/13/2033   INFLUENZA VACCINE  Completed   HPV VACCINES  Aged Out   COVID-19 Vaccine  Discontinued    Health Maintenance  Health Maintenance Due  Topic Date Due   Pneumonia Vaccine 20+ Years old (1 of 2 - PCV) Never done   Hepatitis C Screening  Never done   Fecal DNA (Cologuard)  Never done   Zoster Vaccines- Shingrix (1 of 2) Never done   Diabetic kidney evaluation - eGFR measurement  03/14/2023    Colorectal cancer screening: scheduled for 05/23/2023  Lung Cancer Screening: (Low Dose CT Chest recommended if Age 63-80 years, 20 pack-year currently smoking OR have quit w/in 15years.) does not qualify.   Lung Cancer Screening Referral: no  Additional Screening:  Hepatitis C Screening: does qualify;   Vision Screening: Recommended annual ophthalmology exams for early detection of glaucoma and other disorders of the eye. Is the patient up to date with their annual eye exam?  Yes  Who is the provider or what is the name of the office in which the patient attends annual eye exams? [facility] If pt is not established with a provider, would they like to be referred to a provider to establish care? No .   Dental Screening: Recommended annual dental exams for proper oral hygiene  Diabetic Foot Exam: Diabetic Foot Exam: Completed 08/14/2022  Community Resource Referral / Chronic Care Management: CRR required this visit?  No   CCM required this visit?  No     Plan:     I have personally reviewed and noted the following in the patient's chart:   Medical and social history Use of alcohol, tobacco or illicit drugs  Current  medications and supplements including opioid prescriptions. Patient is not currently taking opioid prescriptions. Functional ability and status Nutritional status Physical activity Advanced directives List of other physicians Hospitalizations, surgeries, and ER visits in previous 12 months Vitals Screenings to include cognitive, depression, and falls Referrals and appointments  In addition, I have reviewed and discussed with patient certain preventive protocols, quality metrics, and best practice recommendations. A written personalized care plan for preventive services as well as general preventive health recommendations were provided to patient.     Barb Merino, LPN   1/61/0960   After Visit Summary: (MyChart) Due to this being a telephonic visit, the after visit summary with patients personalized plan was offered to patient via MyChart   Nurse Notes: none

## 2023-05-10 NOTE — Telephone Encounter (Signed)
[facility] Pre-operative Risk Assessment     Request for surgical clearance:     Endoscopy Procedure  What type of surgery is being performed?     colonoscopy  When is this surgery scheduled?     05/23/23  What type of clearance is required ?   Pharmacy  Are there any medications that need to be held prior to surgery and how long? Plavix x 5 days  Practice name and name of physician performing surgery?      [facility]  What is your office phone and fax number?      Phone- [phone]  Fax- [phone]  Anesthesia type (None, local, MAC, general) ?       MAC   Please route your response to Jovita Kussmaul, CMA

## 2023-05-10 NOTE — Patient Instructions (Signed)
Adam [name] , Thank you for taking time to come for your Medicare Wellness Visit. I appreciate your ongoing commitment to your health goals. Please review the following plan we discussed and let me know if I can assist you in the future.   Referrals/Orders/Follow-Ups/Clinician Recommendations: none  This is a list of the screening recommended for you and due dates:  Health Maintenance  Topic Date Due   Pneumonia Vaccine (1 of 2 - PCV) Never done   Hepatitis C Screening  Never done   Cologuard (Stool DNA test)  Never done   Zoster (Shingles) Vaccine (1 of 2) Never done   Yearly kidney function blood test for diabetes  03/14/2023   Hemoglobin A1C  07/06/2023   Yearly kidney health urinalysis for diabetes  08/14/2023   Complete foot exam   08/14/2023   Eye exam for diabetics  11/17/2023   Medicare Annual Wellness Visit  05/09/2024   DTaP/Tdap/Td vaccine (2 - Td or Tdap) 02/13/2033   Flu Shot  Completed   HPV Vaccine  Aged Out   COVID-19 Vaccine  Discontinued    Advanced directives: (ACP Link)Information on Advanced Care Planning can be found at [facility] Advance Health Care Directives Advance Health Care Directives ([email or web address]   Next Medicare Annual Wellness Visit scheduled for next year: Yes  insert Preventive Care attachment Insert FALL PREVENTION attachment if needed

## 2023-05-10 NOTE — Patient Instructions (Signed)
You have been scheduled for a colonoscopy. Please follow written instructions given to you at your visit today.   If you use inhalers (even only as needed), please bring them with you on the day of your procedure.  DO NOT TAKE 7 DAYS PRIOR TO TEST- Trulicity (dulaglutide) Ozempic, Wegovy (semaglutide) Mounjaro (tirzepatide) Bydureon Bcise (exanatide extended release)  DO NOT TAKE 1 DAY PRIOR TO YOUR TEST Rybelsus (semaglutide) Adlyxin (lixisenatide) Victoza (liraglutide) Byetta (exanatide) ___________________________________________________________________________   If your blood pressure at your visit was 140/90 or greater, please contact your primary care physician to follow up on this.  _______________________________________________________  If you are age 56 or older, your body mass index should be between 23-30. Your Body mass index is 27.87 kg/m. If this is out of the aforementioned range listed, please consider follow up with your Primary Care Provider.  If you are age 18 or younger, your body mass index should be between 19-25. Your Body mass index is 27.87 kg/m. If this is out of the aformentioned range listed, please consider follow up with your Primary Care Provider.   ________________________________________________________  The [facility] providers would like to encourage you to use [facility] to communicate with providers for non-urgent requests or questions.  Due to long hold times on the telephone, sending your provider a message by [facility] may be a faster and more efficient way to get a response.  Please allow 48 business hours for a response.  Please remember that this is for non-urgent requests.  _______________________________________________________

## 2023-05-13 ENCOUNTER — Other Ambulatory Visit: Payer: Self-pay | Admitting: Family Medicine

## 2023-05-14 NOTE — Telephone Encounter (Signed)
 Chart reviewed. Pt ok to hold plavix 5-7 days before his procedure as needed. In reviewing his chart, he doesn't have a strong indication to remain on long-term plavix. Will review this with him at his next OV. thanks

## 2023-05-14 NOTE — Telephone Encounter (Signed)
 Informed patient he can hold his Plavix per Dr. Excell Seltzer 5 days prior to his procedure. Patient verbalized understanding.

## 2023-05-14 NOTE — Telephone Encounter (Signed)
   Name: RENNIE ROUCH  DOB: 24-Oct-1955  MRN: [id number]   Primary Cardiologist: Tonny Bollman, MD  Chart reviewed as part of pre-operative protocol coverage.   Per Dr. Excell Seltzer, patient may hold Plavix 5 days prior to procedure. Aspirin 81 mg daily should be continued throughout perioperative period.    I will route this recommendation to the requesting party via Epic fax function and remove from pre-op pool. Please call with questions.  Carlos Levering, NP 05/14/2023, 12:53 PM

## 2023-05-15 ENCOUNTER — Encounter: Payer: Self-pay | Admitting: Family Medicine

## 2023-05-15 ENCOUNTER — Encounter: Payer: Self-pay | Admitting: Gastroenterology

## 2023-05-15 ENCOUNTER — Ambulatory Visit: Payer: BC Managed Care – PPO | Admitting: Family Medicine

## 2023-05-15 VITALS — BP 128/77 | HR 77 | Temp 98.1°F | Resp 16 | Ht 76.0 in | Wt 230.6 lb

## 2023-05-15 DIAGNOSIS — E1159 Type 2 diabetes mellitus with other circulatory complications: Secondary | ICD-10-CM | POA: Diagnosis not present

## 2023-05-15 DIAGNOSIS — M25512 Pain in left shoulder: Secondary | ICD-10-CM | POA: Diagnosis not present

## 2023-05-15 DIAGNOSIS — G8929 Other chronic pain: Secondary | ICD-10-CM | POA: Diagnosis not present

## 2023-05-15 DIAGNOSIS — Z23 Encounter for immunization: Secondary | ICD-10-CM | POA: Diagnosis not present

## 2023-05-15 MED ORDER — GABAPENTIN 300 MG PO CAPS: 300.0000 mg | ORAL_CAPSULE | Freq: Three times a day (TID) | ORAL | 3 refills | Status: DC

## 2023-05-15 NOTE — Progress Notes (Unsigned)
 Established Patient Office Visit  Subjective    Patient ID: Adam [name], male    DOB: [birth year]  Age: 68 y.o. MRN: [id number]  CC:  Chief Complaint  Patient presents with   Follow-up    3 month, numbness in feet and shoulders    HPI Adam [name] presents for routine follow up of diabetes and chronic shoulder pain. Patient reports med compliance.   Outpatient Encounter Medications as of 05/15/2023  Medication Sig   aspirin EC 81 MG EC tablet Take 1 tablet (81 mg total) by mouth daily.   atorvastatin (LIPITOR) 40 MG tablet TAKE 1 TABLET BY MOUTH DAILY AT 6 PM.   Blood Glucose Monitoring Suppl (ONE TOUCH ULTRA 2) w/Device KIT Use to check FSBS fasting daily. Dx: E11.59   Blood Pressure KIT Check blood pressure once to twice daily. Notify PCP if readings greater than 160/100. ICD10 code-I10 dispense brand covered by health insurance plan   clopidogrel (PLAVIX) 75 MG tablet TAKE 1 TABLET BY MOUTH EVERY DAY   gabapentin (NEURONTIN) 300 MG capsule Take 1 capsule (300 mg total) by mouth 3 (three) times daily.   glipiZIDE (GLUCOTROL XL) 10 MG 24 hr tablet TAKE 1 TABLET (10 MG TOTAL) BY MOUTH DAILY WITH BREAKFAST.   glucose blood (ONETOUCH ULTRA) test strip USE TO TEST BLOOD SUGAR TWO TIMES A DAY   hydrochlorothiazide (HYDRODIURIL) 25 MG tablet TAKE 1 TABLET (25 MG TOTAL) BY MOUTH DAILY.   KLOR-CON M10 10 MEQ tablet TAKE 1 TABLET BY MOUTH EVERY DAY   Lancet Devices (ONE TOUCH DELICA LANCING DEV) MISC Use to check FSBS fasting daily. Dx: E11.59   Lancets Misc. (ACCU-CHEK FASTCLIX LANCET) KIT Use to check FSBS fasting daily. Dx: E11.59   lisinopril (ZESTRIL) 5 MG tablet TAKE 1 TABLET (5 MG TOTAL) BY MOUTH DAILY.   metFORMIN (GLUCOPHAGE) 500 MG tablet TAKE 1 TABLET BY MOUTH TWICE A DAY WITH FOOD   metoprolol succinate (TOPROL-XL) 25 MG 24 hr tablet Take 1 tablet (25 mg total) by mouth daily.   metoprolol tartrate (LOPRESSOR) 25 MG tablet Take 0.5 tablets (12.5 mg total) by mouth 2 (two)  times daily.   Multiple Vitamins-Minerals (MULTI FOR HIM PO) Take by mouth. gummies with omega 3   nitroGLYCERIN (NITROSTAT) 0.4 MG SL tablet PLACE 1 TABLET UNDER THE TONUGE EVERY 5 MINUTES AS NEEDED FOR CHEST PAIN   OneTouch Delica Lancets 33G MISC Use to check FSBS fasting daily. Dx: E11.59   tamsulosin (FLOMAX) 0.4 MG CAPS capsule Take 1 capsule (0.4 mg total) by mouth daily.   No facility-administered encounter medications on file as of 05/15/2023.    Past Medical History:  Diagnosis Date   Coronary artery disease    Diabetes mellitus without complication (HCC)    Myocardial infarction (HCC)    PCI/DES to pLCx, PTCA to PDA, small nondominent RCA with 95%   Occasional recreational drug use     Past Surgical History:  Procedure Laterality Date   CORONARY BALLOON ANGIOPLASTY N/A 02/05/2019   Procedure: CORONARY BALLOON ANGIOPLASTY;  Surgeon: Marykay Lex, MD;  Location: [facility] INVASIVE CV LAB;  Service: Cardiovascular;  Laterality: N/A;   CORONARY STENT INTERVENTION N/A 02/05/2019   Procedure: CORONARY STENT INTERVENTION;  Surgeon: Marykay Lex, MD;  Location: [facility] INVASIVE CV LAB;  Service: Cardiovascular;  Laterality: N/A;   CORONARY THROMBECTOMY N/A 02/05/2019   Procedure: Coronary Thrombectomy;  Surgeon: Marykay Lex, MD;  Location: [facility] INVASIVE CV LAB;  Service: Cardiovascular;  Laterality:  N/A;   LEFT HEART CATH AND CORONARY ANGIOGRAPHY N/A 02/05/2019   Procedure: LEFT HEART CATH AND CORONARY ANGIOGRAPHY;  Surgeon: Marykay Lex, MD;  Location: [facility] INVASIVE CV LAB;  Service: Cardiovascular;  Laterality: N/A;    Family History  Problem Relation Age of Onset   Heart disease Mother    Diabetes Father    Breast cancer Sister    Multiple sclerosis Sister    Stomach cancer Sister    Healthy Neg Hx     Social History   Socioeconomic History   Marital status: Single    Spouse name: Not on file   Number of children: 12   Years of education: 12   Highest education  level: Not on file  Occupational History   Not on file  Tobacco Use   Smoking status: Never   Smokeless tobacco: Never  Vaping Use   Vaping status: Never Used  Substance and Sexual Activity   Alcohol use: Yes    Comment: SOCIAL   Drug use: Not Currently    Types: Cocaine   Sexual activity: Not on file  Other Topics Concern   Not on file  Social History Narrative   Not on file   Social Drivers of Health   Financial Resource Strain: Low Risk  (05/10/2023)   Overall Financial Resource Strain (CARDIA)    Difficulty of Paying Living Expenses: Not hard at all  Food Insecurity: No Food Insecurity (05/10/2023)   Hunger Vital Sign    Worried About Running Out of Food in the Last Year: Never true    Ran Out of Food in the Last Year: Never true  Transportation Needs: No Transportation Needs (05/10/2023)   PRAPARE - Administrator, Civil Service (Medical): No    Lack of Transportation (Non-Medical): No  Physical Activity: Inactive (05/10/2023)   Exercise Vital Sign    Days of Exercise per Week: 0 days    Minutes of Exercise per Session: 0 min  Stress: No Stress Concern Present (05/10/2023)   [employer] of Occupational Health - Occupational Stress Questionnaire    Feeling of Stress : Not at all  Social Connections: Moderately Isolated (05/10/2023)   Social Connection and Isolation Panel [NHANES]    Frequency of Communication with Friends and Family: More than three times a week    Frequency of Social Gatherings with Friends and Family: Once a week    Attends Religious Services: More than 4 times per year    Active Member of [employer] or Organizations: No    Attends Banker Meetings: Never    Marital Status: Never married  Intimate Partner Violence: Not At Risk (05/10/2023)   Humiliation, Afraid, Rape, and Kick questionnaire    Fear of Current or Ex-Partner: No    Emotionally Abused: No    Physically Abused: No    Sexually Abused: No    Review of Systems   All other systems reviewed and are negative.       Objective    BP 128/77 (BP Location: Right Arm, Patient Position: Sitting, Cuff Size: Normal)   Pulse 77   Temp 98.1 F (36.7 C) (Oral)   Resp 16   Ht 6\' 4"  (1.93 m)   Wt 230 lb 9.6 oz (104.6 kg)   SpO2 93%   BMI 28.07 kg/m   Physical Exam Vitals and nursing note reviewed.  Constitutional:      General: He is not in acute distress. Cardiovascular:     Rate  and Rhythm: Normal rate and regular rhythm.  Pulmonary:     Effort: Pulmonary effort is normal.     Breath sounds: Normal breath sounds.  Abdominal:     Palpations: Abdomen is soft.     Tenderness: There is no abdominal tenderness.  Musculoskeletal:     Left shoulder: Tenderness present. No swelling or deformity. Decreased range of motion.  Neurological:     General: No focal deficit present.     Mental Status: He is alert and oriented to person, place, and time.         Assessment & Plan:   Type 2 diabetes mellitus with other circulatory complication, without long-term current use of insulin (HCC)  Chronic left shoulder pain -     Ambulatory referral to Orthopedic Surgery  Immunization due -     Pneumococcal conjugate vaccine 20-valent  Other orders -     Varicella-zoster vaccine IM -     Gabapentin; Take 1 capsule (300 mg total) by mouth 3 (three) times daily.  Dispense: 90 capsule; Refill: 3     Return in about 4 months (around 09/12/2023).   [name], MD

## 2023-05-16 ENCOUNTER — Encounter: Payer: Self-pay | Admitting: Family Medicine

## 2023-05-17 ENCOUNTER — Telehealth: Payer: Self-pay | Admitting: Family Medicine

## 2023-05-17 ENCOUNTER — Ambulatory Visit: Payer: BC Managed Care – PPO | Admitting: Family Medicine

## 2023-05-17 NOTE — Telephone Encounter (Signed)
 I have attempted without success to contact this patient by phone to return their call and I left a message on answering machine.

## 2023-05-17 NOTE — Telephone Encounter (Signed)
 Copied from CRM [id number]. Topic: Clinical - Medication Question >> May 17, 2023  9:54 AM Maxwell Marion wrote: Reason for CRM: Patient has a procedure scheduled next week and wants to know if clopidogrel(Plavix) is considered a blood thinner because on paperwork, it says to stop any blood thinner medications 5 days prior to procedure. Call back number is [phone]

## 2023-05-23 ENCOUNTER — Ambulatory Visit: Payer: BC Managed Care – PPO | Admitting: Gastroenterology

## 2023-05-23 ENCOUNTER — Encounter: Payer: Self-pay | Admitting: Gastroenterology

## 2023-05-23 VITALS — BP 114/73 | HR 57 | Temp 98.2°F | Resp 17 | Ht 76.0 in | Wt 229.0 lb

## 2023-05-23 DIAGNOSIS — K5730 Diverticulosis of large intestine without perforation or abscess without bleeding: Secondary | ICD-10-CM

## 2023-05-23 DIAGNOSIS — D123 Benign neoplasm of transverse colon: Secondary | ICD-10-CM | POA: Diagnosis not present

## 2023-05-23 DIAGNOSIS — Z1211 Encounter for screening for malignant neoplasm of colon: Secondary | ICD-10-CM | POA: Diagnosis not present

## 2023-05-23 DIAGNOSIS — K640 First degree hemorrhoids: Secondary | ICD-10-CM

## 2023-05-23 MED ORDER — SODIUM CHLORIDE 0.9 % IV SOLN
500.0000 mL | Freq: Once | INTRAVENOUS | Status: DC
Start: 2023-05-23 — End: 2023-05-23

## 2023-05-23 NOTE — Patient Instructions (Addendum)
 Resume previous diet and medications.  Follow up colonoscopy based on biopsy results.  Resume Plavix in 3 days.    YOU HAD AN ENDOSCOPIC PROCEDURE TODAY AT THE [facility]:   Refer to the procedure report that was given to you for any specific questions about what was found during the examination.  If the procedure report does not answer your questions, please call your gastroenterologist to clarify.  If you requested that your care partner not be given the details of your procedure findings, then the procedure report has been included in a sealed envelope for you to review at your convenience later.  YOU SHOULD EXPECT: Some feelings of bloating in the abdomen. Passage of more gas than usual.  Walking can help get rid of the air that was put into your GI tract during the procedure and reduce the bloating. If you had a lower endoscopy (such as a colonoscopy or flexible sigmoidoscopy) you may notice spotting of blood in your stool or on the toilet paper. If you underwent a bowel prep for your procedure, you may not have a normal bowel movement for a few days.  Please Note:  You might notice some irritation and congestion in your nose or some drainage.  This is from the oxygen used during your procedure.  There is no need for concern and it should clear up in a day or so.  SYMPTOMS TO REPORT IMMEDIATELY:  Following lower endoscopy (colonoscopy or flexible sigmoidoscopy):  Excessive amounts of blood in the stool  Significant tenderness or worsening of abdominal pains  Swelling of the abdomen that is new, acute  Fever of 100F or higher  For urgent or emergent issues, a gastroenterologist can be reached at any hour by calling [phone]. Do not use MyChart messaging for urgent concerns.    DIET:  We do recommend a small meal at first, but then you may proceed to your regular diet.  Drink plenty of fluids but you should avoid alcoholic beverages for 24 hours.  ACTIVITY:  You should  plan to take it easy for the rest of today and you should NOT DRIVE or use heavy machinery until tomorrow (because of the sedation medicines used during the test).    FOLLOW UP: Our staff will call the number listed on your records the next business day following your procedure.  We will call around 7:15- 8:00 am to check on you and address any questions or concerns that you may have regarding the information given to you following your procedure. If we do not reach you, we will leave a message.     If any biopsies were taken you will be contacted by phone or by letter within the next 1-3 weeks.  Please call us at [phone] if you have not heard about the biopsies in 3 weeks.    SIGNATURES/CONFIDENTIALITY: You and/or your care partner have signed paperwork which will be entered into your electronic medical record.  These signatures attest to the fact that that the information above on your After Visit Summary has been reviewed and is understood.  Full responsibility of the confidentiality of this discharge information lies with you and/or your care-partner.

## 2023-05-23 NOTE — Progress Notes (Signed)
 Pt's states no medical or surgical changes since previsit or office visit.

## 2023-05-23 NOTE — Progress Notes (Signed)
 Chief Complaint: Screening colonoscopy Primary GI MD: Gentry Fitz   HPI: 68 year old male history of CAD s/p NSTEMI 01/2019 with three-vessel CAD treated with DES and balloon angioplasty (on Plavix), polysubstance abuse (cocaine), diabetes type 2, hyperlipidemia, presents for evaluation of screening colonoscopy.   Follows with Dr. Excell Seltzer cardiology.  Referred here by PCP for screening colonoscopy.  No history of previous screening colonoscopy   Last labs show normal CMP   Patient states he is here today for his for screening colonoscopy.  States he has a brother with "colon issues" but is unaware of any colon cancer in his family.  Patient denies GI symptoms today including weight loss, change in bowel habits, rectal bleeding, nausea, vomiting.  No previous history of colonoscopy.     PREVIOUS GI WORKUP    Echocardiogram 01/2019 with EF 50 to 55%       Past Medical History:  Diagnosis Date   Coronary artery disease     Diabetes mellitus without complication (HCC)     Myocardial infarction (HCC)      PCI/DES to pLCx, PTCA to PDA, small nondominent RCA with 95%   Occasional recreational drug use                 Past Surgical History:  Procedure Laterality Date   CORONARY BALLOON ANGIOPLASTY N/A 02/05/2019    Procedure: CORONARY BALLOON ANGIOPLASTY;  Surgeon: Marykay Lex, MD;  Location: [facility] INVASIVE CV LAB;  Service: Cardiovascular;  Laterality: N/A;   CORONARY STENT INTERVENTION N/A 02/05/2019    Procedure: CORONARY STENT INTERVENTION;  Surgeon: Marykay Lex, MD;  Location: [facility] INVASIVE CV LAB;  Service: Cardiovascular;  Laterality: N/A;   CORONARY THROMBECTOMY N/A 02/05/2019    Procedure: Coronary Thrombectomy;  Surgeon: Marykay Lex, MD;  Location: [facility] INVASIVE CV LAB;  Service: Cardiovascular;  Laterality: N/A;   LEFT HEART CATH AND CORONARY ANGIOGRAPHY N/A 02/05/2019    Procedure: LEFT HEART CATH AND CORONARY ANGIOGRAPHY;  Surgeon: Marykay Lex, MD;   Location: [facility] INVASIVE CV LAB;  Service: Cardiovascular;  Laterality: N/A;                Current Outpatient Medications  Medication Sig Dispense Refill   aspirin EC 81 MG EC tablet Take 1 tablet (81 mg total) by mouth daily. 90 tablet 3   atorvastatin (LIPITOR) 40 MG tablet TAKE 1 TABLET BY MOUTH DAILY AT 6 PM. 90 tablet 3   Blood Glucose Monitoring Suppl (ONE TOUCH ULTRA 2) w/Device KIT Use to check FSBS fasting daily. Dx: E11.59 1 kit 0   Blood Pressure KIT Check blood pressure once to twice daily. Notify PCP if readings greater than 160/100. ICD10 code-I10 dispense brand covered by health insurance plan 1 kit 0   clopidogrel (PLAVIX) 75 MG tablet TAKE 1 TABLET BY MOUTH EVERY DAY 90 tablet 2   glipiZIDE (GLUCOTROL XL) 10 MG 24 hr tablet TAKE 1 TABLET (10 MG TOTAL) BY MOUTH DAILY WITH BREAKFAST. 90 tablet 0   glucose blood (ONETOUCH ULTRA) test strip USE TO TEST BLOOD SUGAR TWO TIMES A DAY 100 strip 3   hydrochlorothiazide (HYDRODIURIL) 25 MG tablet TAKE 1 TABLET (25 MG TOTAL) BY MOUTH DAILY. 90 tablet 3   Lancet Devices (ONE TOUCH DELICA LANCING DEV) MISC Use to check FSBS fasting daily. Dx: E11.59 1 each 0   Lancets Misc. (ACCU-CHEK FASTCLIX LANCET) KIT Use to check FSBS fasting daily. Dx: E11.59 1 kit 0   lisinopril (ZESTRIL) 5 MG tablet  Take 1 tablet (5 mg total) by mouth daily. 90 tablet 0   metFORMIN (GLUCOPHAGE) 500 MG tablet TAKE 1 TABLET BY MOUTH TWICE A DAY WITH FOOD 180 tablet 1   metoprolol succinate (TOPROL-XL) 25 MG 24 hr tablet Take 1 tablet (25 mg total) by mouth daily. 90 tablet 1   Multiple Vitamins-Minerals (MULTI FOR HIM PO) Take by mouth. gummies with omega 3       Na Sulfate-K Sulfate-Mg Sulfate concentrate 17.5-3.13-1.6 GM/177ML SOLN Take 1 kit by mouth once for 1 dose. 354 mL 0   nitroGLYCERIN (NITROSTAT) 0.4 MG SL tablet PLACE 1 TABLET UNDER THE TONUGE EVERY 5 MINUTES AS NEEDED FOR CHEST PAIN 75 tablet 3   OneTouch Delica Lancets 33G MISC Use to check FSBS fasting  daily. Dx: E11.59 100 each 1   tamsulosin (FLOMAX) 0.4 MG CAPS capsule Take 1 capsule (0.4 mg total) by mouth daily. 30 capsule 3   KLOR-CON M10 10 MEQ tablet TAKE 1 TABLET BY MOUTH EVERY DAY 90 tablet 3   metoprolol tartrate (LOPRESSOR) 25 MG tablet Take 0.5 tablets (12.5 mg total) by mouth 2 (two) times daily. 90 tablet 3      No current facility-administered medications for this visit.           Allergies as of 05/10/2023   (No Known Allergies)           Family History  Problem Relation Age of Onset   Heart disease Mother     Diabetes Father     Breast cancer Sister     Multiple sclerosis Sister     Stomach cancer Sister     Healthy Neg Hx            Social History         Socioeconomic History   Marital status: Single      Spouse name: Not on file   Number of children: 12   Years of education: 12   Highest education level: Not on file  Occupational History   Not on file  Tobacco Use   Smoking status: Never   Smokeless tobacco: Never  Vaping Use   Vaping status: Never Used  Substance and Sexual Activity   Alcohol use: Yes      Comment: SOCIAL   Drug use: Not Currently      Types: Cocaine   Sexual activity: Not on file  Other Topics Concern   Not on file  Social History Narrative   Not on file    Social Drivers of Health        Financial Resource Strain: Low Risk  (04/19/2022)    Overall Financial Resource Strain (CARDIA)     Difficulty of Paying Living Expenses: Not hard at all  Food Insecurity: No Food Insecurity (04/19/2022)    Hunger Vital Sign     Worried About Running Out of Food in the Last Year: Never true     Ran Out of Food in the Last Year: Never true  Transportation Needs: No Transportation Needs (04/19/2022)    PRAPARE - Therapist, art (Medical): No     Lack of Transportation (Non-Medical): No  Physical Activity: Inactive (04/19/2022)    Exercise Vital Sign     Days of Exercise per Week: 0 days     Minutes  of Exercise per Session: 0 min  Stress: No Stress Concern Present (04/19/2022)    [employer] of Occupational Health - Occupational Stress Questionnaire  Feeling of Stress : Not at all  Social Connections: Socially Integrated (01/05/2023)    Social Connection and Isolation Panel [NHANES]     Frequency of Communication with Friends and Family: More than three times a week     Frequency of Social Gatherings with Friends and Family: Once a week     Attends Religious Services: More than 4 times per year     Active Member of [employer] or Organizations: Yes     Attends Banker Meetings: More than 4 times per year     Marital Status: Living with partner  Intimate Partner Violence: Not At Risk (01/05/2023)    Humiliation, Afraid, Rape, and Kick questionnaire     Fear of Current or Ex-Partner: No     Emotionally Abused: No     Physically Abused: No     Sexually Abused: No      Review of Systems:    Constitutional: No weight loss, fever, chills, weakness or fatigue HEENT: Eyes: No change in vision               Ears, Nose, Throat:  No change in hearing or congestion Skin: No rash or itching Cardiovascular: No chest pain, chest pressure or palpitations   Respiratory: No SOB or cough Gastrointestinal: See HPI and otherwise negative Genitourinary: No dysuria or change in urinary frequency Neurological: No headache, dizziness or syncope Musculoskeletal: No new muscle or joint pain Hematologic: No bleeding or bruising Psychiatric: No history of depression or anxiety      Physical Exam:  Vital signs: BP 130/80 (BP Location: Left Arm, Patient Position: Sitting, Cuff Size: Normal)   Pulse 65   Ht 6\' 4"  (1.93 m)   Wt 229 lb (103.9 kg)   SpO2 95%   BMI 27.87 kg/m    Constitutional: NAD, Well developed, Well nourished, alert and cooperative Head:  Normocephalic and atraumatic. Eyes:   PEERL, EOMI. No icterus. Conjunctiva pink. Respiratory: Respirations even and unlabored.  Lungs clear to auscultation bilaterally.   No wheezes, crackles, or rhonchi.  Cardiovascular:  Regular rate and rhythm. No peripheral edema, cyanosis or pallor.  Gastrointestinal:  Soft, nondistended, nontender. No rebound or guarding. Normal bowel sounds. No appreciable masses or hepatomegaly. Rectal:  Not performed.  Msk:  Symmetrical without gross deformities. Without edema, no deformity or joint abnormality.  Neurologic:  Alert and  oriented x4;  grossly normal neurologically.  Skin:   Dry and intact without significant lesions or rashes. Psychiatric: Oriented to person, place and time. Demonstrates good judgement and reason without abnormal affect or behaviors.   RELEVANT LABS AND IMAGING: CBC Labs (Brief)          Component Value Date/Time    WBC 5.2 08/02/2020 1051    WBC 9.6 02/06/2019 0350    RBC 4.52 08/02/2020 1051    RBC 4.35 02/06/2019 0350    HGB 12.6 (L) 08/02/2020 1051    HCT 39.4 08/02/2020 1051    PLT 181 08/02/2020 1051    MCV 87 08/02/2020 1051    MCH 27.9 08/02/2020 1051    MCH 28.0 02/06/2019 0350    MCHC 32.0 08/02/2020 1051    MCHC 31.7 02/06/2019 0350    RDW 11.2 (L) 08/02/2020 1051    LYMPHSABS 2.3 03/04/2019 0859    EOSABS 0.2 03/04/2019 0859    BASOSABS 0.0 03/04/2019 0859        CMP     Labs (Brief)  Component Value Date/Time    NA 141 03/13/2022 0910    K 4.5 03/13/2022 0910    CL 104 03/13/2022 0910    CO2 24 03/13/2022 0910    GLUCOSE 138 (H) 03/13/2022 0910    GLUCOSE 189 (H) 02/06/2019 0350    BUN 18 03/13/2022 0910    CREATININE 1.58 (H) 03/13/2022 0910    CALCIUM 9.7 03/13/2022 0910    PROT 7.0 03/13/2022 0910    ALBUMIN 4.4 03/13/2022 0910    AST 12 03/13/2022 0910    ALT 15 03/13/2022 0910    ALKPHOS 77 03/13/2022 0910    BILITOT 1.0 03/13/2022 0910    GFRNONAA 76 06/18/2019 0000    GFRAA 88 06/18/2019 0000          Assessment/Plan:    Screening for colon cancer No previous colonoscopy.  No family history of  colonoscopy.  No GI issues today. - Schedule colonoscopy - I thoroughly discussed the procedure with the patient (at bedside) to include nature of the procedure, alternatives, benefits, and risks (including but not limited to bleeding, infection, perforation, anesthesia/cardiac pulmonary complications).  Patient verbalized understanding and gave verbal consent to proceed with procedure. - Will hold Plavix 5 days prior to endoscopic procedures - will instruct when and how to resume after procedure. Benefits and risks of procedure explained including risks of bleeding, perforation, infection, missed lesions, reactions to medications and possible need for hospitalization and surgery for complications. Additional rare but real risk of stroke or other vascular clotting events off Plavix also explained and need to seek urgent help if any signs of these problems occur. Will communicate by phone or EMR with patient's  prescribing provider to confirm that holding Plavix is reasonable in this case.     Long-term anticoagulation CAD s/p NSTEMI 01/2019 with three-vessel CAD treated with DES and balloon angioplasty (on Plavix) LEC candidate with recent EF 50 to 55% Will hold Plavix       [facility]     Attending physician's note   I have taken history, reviewed the chart and examined the patient. I performed a substantive portion of this encounter, including complete performance of at least one of the key components, in conjunction with the APP. I agree with the Advanced Practitioner's note, impression and recommendations.     Edman Circle, MD [facility] [phone]

## 2023-05-23 NOTE — Op Note (Signed)
 [facility] Patient Name: Adam [name] Procedure Date: 05/23/2023 3:24 PM MRN: [id number] Endoscopist: Lynann Bologna , MD, [id number] Age: 68 Referring MD:  Date of Birth: 05/09/1955 Gender: Male Account #: [id number] Procedure:                Colonoscopy Indications:              Screening for colorectal malignant neoplasm Medicines:                Monitored Anesthesia Care Procedure:                Pre-Anesthesia Assessment:                           - Prior to the procedure, a History and Physical                            was performed, and patient medications and                            allergies were reviewed. The patient's tolerance of                            previous anesthesia was also reviewed. The risks                            and benefits of the procedure and the sedation                            options and risks were discussed with the patient.                            All questions were answered, and informed consent                            was obtained. Prior Anticoagulants: The patient has                            taken Plavix (clopidogrel), last dose was 5 days                            prior to procedure. ASA Grade Assessment: III - A                            patient with severe systemic disease. After                            reviewing the risks and benefits, the patient was                            deemed in satisfactory condition to undergo the                            procedure.  After obtaining informed consent, the colonoscope                            was passed under direct vision. Throughout the                            procedure, the patient's blood pressure, pulse, and                            oxygen saturations were monitored continuously. The                            CF [id number] #[id number] was introduced through the anus                            and advanced to the the cecum, identified  by                            appendiceal orifice and ileocecal valve. The                            colonoscopy was somewhat difficult due to poor                            bowel prep and a tortuous colon. Successful                            completion of the procedure was aided by applying                            abdominal pressure and lavage. The patient                            tolerated the procedure well. The quality of the                            bowel preparation was adequate to identify polyps                            greater than 5 mm in size. Several areas with solid                            stool which clogged the suction channel. Despite                            agressive suction/aspiration- 85-90% of colonic                            mucosa was visualized. The ileocecal valve,                            appendiceal orifice, and rectum were photographed. Scope In: 3:36:11 PM Scope Out: 3:59:23 PM Scope Withdrawal Time: 0 hours 15 minutes  19 seconds  Total Procedure Duration: 0 hours 23 minutes 12 seconds  Findings:                 Two sessile polyps were found in the mid transverse                            colon. The polyps were 4 to 6 mm in size. These                            polyps were removed with a cold snare. Resection                            and retrieval were complete.                           A few small-mouthed diverticula were found in the                            sigmoid colon.                           Non-bleeding internal hemorrhoids were found during                            retroflexion. The hemorrhoids were small and Grade                            I (internal hemorrhoids that do not prolapse).                           The exam was otherwise without abnormality on                            direct and retroflexion views. Complications:            No immediate complications. Estimated Blood Loss:     Estimated blood loss:  none. Impression:               - Two 4 to 6 mm polyps in the mid transverse colon,                            removed with a cold snare. Resected and retrieved.                           - Mild sigmoid diverticulosis.                           - Non-bleeding internal hemorrhoids.                           - The examination was otherwise normal on direct                            and retroflexion views. Recommendation:           - Patient has a contact number  available for                            emergencies. The signs and symptoms of potential                            delayed complications were discussed with the                            patient. Return to normal activities tomorrow.                            Written discharge instructions were provided to the                            patient.                           - Resume previous diet.                           - Continue present medications.                           - Await pathology results.                           - Repeat colonoscopy for surveillance.                           - Return to GI clinic PRN.                           - Resume Plavix (clopidogrel) at prior dose in 3                            days.                           - The findings and recommendations were discussed                            with the patient's family. Lynann Bologna, MD 05/23/2023 4:06:54 PM This report has been signed electronically.

## 2023-05-23 NOTE — Progress Notes (Signed)
 Called to room to assist during endoscopic procedure.  Patient ID and intended procedure confirmed with present staff. Received instructions for my participation in the procedure from the performing physician.

## 2023-05-23 NOTE — Progress Notes (Signed)
 Vss nad trans to pacu

## 2023-05-24 ENCOUNTER — Telehealth: Payer: Self-pay

## 2023-05-24 NOTE — Telephone Encounter (Signed)
  Follow up Call-     05/23/2023    3:02 PM  Call back number  Post procedure Call Back phone  # [phone]  Permission to leave phone message Yes     Patient questions:  Do you have a fever, pain , or abdominal swelling? No. Pain Score  0 *  Have you tolerated food without any problems? Yes.    Have you been able to return to your normal activities? Yes.    Do you have any questions about your discharge instructions: Diet   No. Medications  No. Follow up visit  No.  Do you have questions or concerns about your Care? No.  Actions: * If pain score is 4 or above: No action needed, pain <4.

## 2023-05-25 ENCOUNTER — Ambulatory Visit: Payer: BC Managed Care – PPO | Admitting: Physician Assistant

## 2023-05-25 ENCOUNTER — Other Ambulatory Visit (INDEPENDENT_AMBULATORY_CARE_PROVIDER_SITE_OTHER): Payer: Self-pay

## 2023-05-25 DIAGNOSIS — M25512 Pain in left shoulder: Secondary | ICD-10-CM

## 2023-05-25 DIAGNOSIS — G8929 Other chronic pain: Secondary | ICD-10-CM | POA: Diagnosis not present

## 2023-05-25 NOTE — Progress Notes (Signed)
 Office Visit Note   Patient: Adam [name]           Date of Birth: 1955-06-15           MRN: [id number] Visit Date: 05/25/2023              Requested by: Georganna Skeans, MD [address] [place],  [place] [address] PCP: Georganna Skeans, MD   Assessment & Plan: Visit Diagnoses:  1. Chronic left shoulder pain     Plan: Impression is chronic left shoulder pain likely biceps versus labral etiology.  I would like to make a referral to Dr. Shon Baton for ultrasound-guided cortisone injection glenohumeral joint.  Referral was made.  Follow-up with [place] as needed.  Follow-Up Instructions: Return for f/u with Dr. Shon Baton for left shoulder GH injection.   Orders:  Orders Placed This Encounter  Procedures   XR Shoulder Left   No orders of the defined types were placed in this encounter.     Procedures: No procedures performed   Clinical Data: No additional findings.   Subjective: Chief Complaint  Patient presents with   Left Shoulder - Pain    HPI patient is a pleasant 68 year old gentleman who comes in today with left shoulder pain.  Symptoms have been ongoing for several months but have worsened over the past 2 months.  He denies ever having an injury to the left shoulder.  He cannot recall any change in activity that led up to his symptoms.  The pain is primarily to the anterior shoulder.  Symptoms are intermittent but worse with forward flexion and abduction activities.  He has not been taking any medication for pain.  No previous cortisone injection to the left shoulder.  Review of Systems as detailed in HPI.  All others reviewed and are negative.   Objective: Vital Signs: There were no vitals taken for this visit.  Physical Exam well-developed well-nourished gentleman in no acute distress.  Alert and oriented x 3.  Ortho Exam left shoulder exam: Forward flexion to approximately 160 degrees.  Abduction to 90 degrees.  Moderate pain with speeds and O'Brien's  testing.  No pain with empty can testing.  No tenderness to the [facility] joint.  He is neurovascularly intact distally.  Specialty Comments:  No specialty comments available.  Imaging: XR Shoulder Left Result Date: 05/25/2023 X-rays demonstrate moderate degenerative changes to the glenohumeral and AC joints.    PMFS History: Patient Active Problem List   Diagnosis Date Noted   Hypertension 04/28/2021   Coronary artery disease involving coronary bypass graft of native heart without angina pectoris 10/21/2019   Cocaine use disorder, mild, in early remission (HCC) 04/15/2019   Hyperlipidemia 02/06/2019   Type 2 diabetes mellitus (HCC) 02/04/2019   NSTEMI (non-ST elevated myocardial infarction) (HCC) 02/04/2019   Past Medical History:  Diagnosis Date   Coronary artery disease    Diabetes mellitus without complication (HCC)    Myocardial infarction (HCC)    PCI/DES to pLCx, PTCA to PDA, small nondominent RCA with 95%   Occasional recreational drug use     Family History  Problem Relation Age of Onset   Heart disease Mother    Diabetes Father    Breast cancer Sister    Multiple sclerosis Sister    Stomach cancer Sister    Healthy Neg Hx     Past Surgical History:  Procedure Laterality Date   CORONARY BALLOON ANGIOPLASTY N/A 02/05/2019   Procedure: CORONARY BALLOON ANGIOPLASTY;  Surgeon: Herbie Baltimore,  Piedad Climes, MD;  Location: [facility] INVASIVE CV LAB;  Service: Cardiovascular;  Laterality: N/A;   CORONARY STENT INTERVENTION N/A 02/05/2019   Procedure: CORONARY STENT INTERVENTION;  Surgeon: Marykay Lex, MD;  Location: [facility] INVASIVE CV LAB;  Service: Cardiovascular;  Laterality: N/A;   CORONARY THROMBECTOMY N/A 02/05/2019   Procedure: Coronary Thrombectomy;  Surgeon: Marykay Lex, MD;  Location: [facility] INVASIVE CV LAB;  Service: Cardiovascular;  Laterality: N/A;   LEFT HEART CATH AND CORONARY ANGIOGRAPHY N/A 02/05/2019   Procedure: LEFT HEART CATH AND CORONARY ANGIOGRAPHY;  Surgeon: Marykay Lex, MD;  Location: [facility] INVASIVE CV LAB;  Service: Cardiovascular;  Laterality: N/A;   Social History   Occupational History   Not on file  Tobacco Use   Smoking status: Never   Smokeless tobacco: Never  Vaping Use   Vaping status: Never Used  Substance and Sexual Activity   Alcohol use: Yes    Comment: SOCIAL   Drug use: Not Currently    Types: Cocaine   Sexual activity: Not on file

## 2023-05-25 NOTE — Addendum Note (Signed)
 Addended by: Wendi Maya on: 05/25/2023 03:24 PM   Modules accepted: Orders

## 2023-05-28 LAB — SURGICAL PATHOLOGY

## 2023-06-04 ENCOUNTER — Encounter: Payer: Self-pay | Admitting: Gastroenterology

## 2023-06-09 ENCOUNTER — Other Ambulatory Visit: Payer: Self-pay | Admitting: Family Medicine

## 2023-06-11 ENCOUNTER — Other Ambulatory Visit: Payer: Self-pay | Admitting: Family Medicine

## 2023-06-11 NOTE — Telephone Encounter (Signed)
 Copied from CRM [id number]. Topic: Clinical - Medication Refill >> Jun 11, 2023  3:57 PM DeAngela L wrote: Most Recent Primary Care Visit:  Provider: Georganna Skeans  Department: [facility]  Visit Type: OFFICE VISIT  Date: 05/15/2023  Medication: tamsulosin (FLOMAX) 0.4 MG CAPS capsule   Has the patient contacted their pharmacy? No, the prescription says no refill on the bottle  (Agent: If no, request that the patient contact the pharmacy for the refill. If patient does not wish to contact the pharmacy document the reason why and proceed with request.) (Agent: If yes, when and what did the pharmacy advise?)  Is this the correct pharmacy for this prescription? Yes  If no, delete pharmacy and type the correct one.  This is the patient's preferred pharmacy:  [phone] [address], [place] - [address] [place] [address] Phone: [phone] Fax: [phone]  Has the prescription been filled recently? Yes   Is the patient out of the medication? No  Has the patient been seen for an appointment in the last year OR does the patient have an upcoming appointment? Yes   Can we respond through MyChart? No  Agent: Please be advised that Rx refills may take up to 3 business days. We ask that you follow-up with your pharmacy.

## 2023-06-12 ENCOUNTER — Encounter: Payer: Self-pay | Admitting: Sports Medicine

## 2023-06-12 ENCOUNTER — Ambulatory Visit (INDEPENDENT_AMBULATORY_CARE_PROVIDER_SITE_OTHER): Admitting: Sports Medicine

## 2023-06-12 ENCOUNTER — Other Ambulatory Visit: Payer: Self-pay

## 2023-06-12 DIAGNOSIS — E1159 Type 2 diabetes mellitus with other circulatory complications: Secondary | ICD-10-CM | POA: Diagnosis not present

## 2023-06-12 DIAGNOSIS — M19012 Primary osteoarthritis, left shoulder: Secondary | ICD-10-CM

## 2023-06-12 DIAGNOSIS — M25512 Pain in left shoulder: Secondary | ICD-10-CM

## 2023-06-12 DIAGNOSIS — G8929 Other chronic pain: Secondary | ICD-10-CM | POA: Diagnosis not present

## 2023-06-12 MED ORDER — BUPIVACAINE HCL 0.25 % IJ SOLN
2.0000 mL | INTRAMUSCULAR | Status: AC | PRN
  Administered 2023-06-12: 2 mL via INTRA_ARTICULAR

## 2023-06-12 MED ORDER — LIDOCAINE HCL 1 % IJ SOLN
2.0000 mL | INTRAMUSCULAR | Status: AC | PRN
  Administered 2023-06-12: 2 mL

## 2023-06-12 MED ORDER — METHYLPREDNISOLONE ACETATE 40 MG/ML IJ SUSP
40.0000 mg | INTRAMUSCULAR | Status: AC | PRN
  Administered 2023-06-12: 40 mg via INTRA_ARTICULAR

## 2023-06-12 NOTE — Progress Notes (Signed)
 Office & Procedure Note  Patient: Adam [name]             Date of Birth: [birth year]           MRN: [id number]             Visit Date: 06/12/2023  HPI: Adam [name] is a very pleasant 68 year old male who presents with chronic left shoulder pain which has been progressive and ongoing over the past few months.  No specific injury.  He did seem one of my partners, Mikey Kirschner, recommended possible ultrasound-guided into the joint.  He and his partner do have questions today regarding activity modification and work restrictions going forward with this and alongside his injection.  He is also a type II diabetic although well-controlled, his last A1c was 5.6.  He is managed on glipizide XL 10 mg daily.  Lab Results  Component Value Date   HGBA1C 5.6 01/05/2023   PE:  - Left shoulder: There is mild TTP over the anterior joint recess.  He does have some stiffness at end range of motion with associated pain.  There is no redness swelling or effusion.  Imaging:  *I did perform independent interpretation and review of three-view left shoulder x-ray from 05/25/2023 which shows moderate degenerative changes in the glenohumeral joint and AC joint with some bony sclerosis of the inferior acetabular rim.  Visit Diagnoses:  1. Chronic left shoulder pain   2. Primary osteoarthritis, left shoulder   3. Type 2 diabetes mellitus with other circulatory complication, without long-term current use of insulin (HCC)    Procedures: Large Joint Inj: L glenohumeral on 06/12/2023 4:14 PM Indications: pain Details: 22 G 3.5 in needle, ultrasound-guided posterior approach Medications: 2 mL lidocaine 1 %; 2 mL bupivacaine 0.25 %; 40 mg methylPREDNISolone acetate 40 MG/ML Outcome: tolerated well, no immediate complications  US-guided glenohumeral joint injection, left shoulder After discussion on risks/benefits/indications, informed verbal consent was obtained. A timeout was then performed. The patient was  positioned lying lateral recumbent on examination table. The patient's shoulder was prepped with betadine and multiple alcohol swabs and utilizing ultrasound guidance, the patient's glenohumeral joint was identified on ultrasound. Using ultrasound guidance a 22-gauge, 3.5 inch needle with a mixture of 2:2:1 cc's lidocaine:bupivicaine:depomedrol was directed from a lateral to medial direction via in-plane technique into the glenohumeral joint with visualization of appropriate spread of injectate into the joint. Patient tolerated the procedure well without immediate complications.      Procedure, treatment alternatives, risks and benefits explained, specific risks discussed. Consent was given by the patient. Immediately prior to procedure a time out was called to verify the correct patient, procedure, equipment, support staff and site/side marked as required. Patient was prepped and draped in the usual sterile fashion.      Plan: -Discussed with Adam [name] based on his x-rays and exam he does have arthritic change within the shoulder but also possibly some bicep tendon involvement. -Through shared decision making did proceed with ultrasound-guided glenohumeral joint injection for both diagnostic and hopefully therapeutic purposes. -He did have questions about activity modification and work restrictions.  I did provide him a note today for work to limit overhead repetitive activity as well as a 10 pound lifting restriction for the next 2 days, he may then return to work without restrictions. -Discussed transient rise in glucose given injection, he may continue his glipizide indicated -Would like to see how he does over the next 2 to 4 weeks, he may then follow-up if  he is not largely improved with Mikey Kirschner or myself. Otherwise, f/u as needed.  Madelyn Brunner, DO Primary Care Sports Medicine Physician  [facility] - Orthopedics  This note was dictated using Dragon naturally speaking  software and may contain errors in syntax, spelling, or content which have not been identified prior to signing this note.

## 2023-06-13 NOTE — Telephone Encounter (Signed)
 Duplicate request, last refilled 06/11/23.  Requested Prescriptions  Pending Prescriptions Disp Refills   tamsulosin (FLOMAX) 0.4 MG CAPS capsule 90 capsule 1    Sig: Take 1 capsule (0.4 mg total) by mouth daily.     Urology: Alpha-Adrenergic Blocker Failed - 06/13/2023  8:36 AM      Failed - PSA in normal range and within 360 days    No results found for: "LABPSA", "PSA", "PSA1", "ULTRAPSA"       Passed - Last BP in normal range    BP Readings from Last 1 Encounters:  05/23/23 114/73         Passed - Valid encounter within last 12 months    Recent Outpatient Visits           4 weeks ago Type 2 diabetes mellitus with other circulatory complication, without long-term current use of insulin (HCC)   [place] [facility], Lauris Poag, MD   3 months ago Benign prostatic hyperplasia with urinary frequency   [place] [facility], Lauris Poag, MD   5 months ago Type 2 diabetes mellitus with other circulatory complication, without long-term current use of insulin [facility])   [place] [facility], Lauris Poag, MD   10 months ago Type 2 diabetes mellitus with other circulatory complication, without long-term current use of insulin (HCC)   [place] [facility], Lauris Poag, MD   1 year ago Type 2 diabetes mellitus with other circulatory complication, without long-term current use of insulin [facility])   [place] [facility], MD

## 2023-07-01 ENCOUNTER — Other Ambulatory Visit: Payer: Self-pay | Admitting: Family Medicine

## 2023-07-08 ENCOUNTER — Other Ambulatory Visit: Payer: Self-pay | Admitting: Family Medicine

## 2023-07-08 DIAGNOSIS — E1159 Type 2 diabetes mellitus with other circulatory complications: Secondary | ICD-10-CM

## 2023-08-09 ENCOUNTER — Other Ambulatory Visit: Payer: Self-pay | Admitting: Family Medicine

## 2023-10-08 ENCOUNTER — Ambulatory Visit: Payer: Self-pay | Admitting: Family Medicine

## 2023-10-08 NOTE — Telephone Encounter (Signed)
 FYI Only or Action Required?: FYI only for provider.  Patient was last seen in primary care on 05/15/2023 by Tanda Bleacher, MD.  Called Nurse Triage reporting Toe Injury.  Symptoms began yesterday.  Interventions attempted: Nothing.  Symptoms are: unchanged.  Triage Disposition: See PCP When Office is Open (Within 3 Days)  Patient/caregiver understands and will follow disposition?: Yes                              Copied from CRM [id number]. Topic: Clinical - Red Word Triage >> Oct 08, 2023  9:07 AM Elle L wrote: Red Word that prompted transfer to Nurse Triage: The patient's fiancee, Corean Pizza, states the patient is diabetic and his toe is covered in a bruise. He denies pain. Reason for Disposition  Large swelling or bruise  Answer Assessment - Initial Assessment Questions This RN spoke with pt's significant other, Stephanie. Pt scheduled for first available appointment with PCP in office on Thursday, 7/17. This RN educated pt significant other on new-worsening symptoms and when to call back/seek emergent care. Pt significant other verbalized understanding and agrees to plan.     Denies pain, swelling, numbness  MECHANISM: How did the injury happen?      Patient does not remember ONSET: When did the injury happen? (e.g., minutes, hours ago)      Pt told significant other last night; pt did not mention when he noticed it LOCATION: What part of the toe is injured? Is the nail damaged?      Big toe on right foot APPEARANCE of TOE INJURY: What does the injury look like?      Looks like a bruise underneath nail SEVERITY: Can you use the foot normally? Can you walk?      Can walk normally SIZE: For cuts, bruises, or swelling, ask: How large is it? (e.g., inches or centimeters;  entire toe)      Covers almost the whole nail PAIN: Is there pain? If Yes, ask: How bad is the pain?   What does it keep you from doing? (Scale 0-10;  or none, mild, moderate, severe)     No pain DIABETES: Do you have a history of diabetes or poor circulation in the feet?     Type 2 diabetes OTHER SYMPTOMS: Do you have any other symptoms?        None  Protocols used: Toe Injury-A-AH

## 2023-10-11 ENCOUNTER — Encounter: Payer: Self-pay | Admitting: Family Medicine

## 2023-10-11 ENCOUNTER — Ambulatory Visit (INDEPENDENT_AMBULATORY_CARE_PROVIDER_SITE_OTHER): Admitting: Family Medicine

## 2023-10-11 VITALS — BP 132/77 | HR 56 | Ht 76.0 in | Wt 227.4 lb

## 2023-10-11 DIAGNOSIS — E1159 Type 2 diabetes mellitus with other circulatory complications: Secondary | ICD-10-CM | POA: Diagnosis not present

## 2023-10-11 DIAGNOSIS — Z7984 Long term (current) use of oral hypoglycemic drugs: Secondary | ICD-10-CM | POA: Diagnosis not present

## 2023-10-11 DIAGNOSIS — S90221A Contusion of right lesser toe(s) with damage to nail, initial encounter: Secondary | ICD-10-CM | POA: Diagnosis not present

## 2023-10-11 MED ORDER — CLOTRIMAZOLE-BETAMETHASONE 1-0.05 % EX CREA: 1.0000 | TOPICAL_CREAM | Freq: Two times a day (BID) | CUTANEOUS | 0 refills | Status: AC

## 2023-10-12 ENCOUNTER — Encounter: Payer: Self-pay | Admitting: Family Medicine

## 2023-10-12 NOTE — Progress Notes (Signed)
 Established Patient Office Visit  Subjective    Patient ID: Adam [name], male    DOB: Jun 11, 1955  Age: 68 y.o. MRN: [id number]  CC:  Chief Complaint  Patient presents with   BRUISE    Bruise on right toe. Insect bite on left side of neck    HPI Adam [name] presents with bruising of right great toe nail. Patient reports acutely present and denies known trauma or injury. It is not painful.   Outpatient Encounter Medications as of 10/11/2023  Medication Sig   aspirin  EC 81 MG EC tablet Take 1 tablet (81 mg total) by mouth daily.   atorvastatin  (LIPITOR ) 40 MG tablet TAKE 1 TABLET BY MOUTH DAILY AT 6 PM.   Blood Glucose Monitoring Suppl (ONE TOUCH ULTRA 2) w/Device KIT Use to check FSBS fasting daily. Dx: E11.59   Blood Pressure KIT Check blood pressure once to twice daily. Notify PCP if readings greater than 160/100. ICD10 code-I10 dispense brand covered by health insurance plan   clopidogrel  (PLAVIX ) 75 MG tablet TAKE 1 TABLET BY MOUTH EVERY DAY   clotrimazole -betamethasone  (LOTRISONE ) cream Apply 1 Application topically 2 (two) times daily.   gabapentin  (NEURONTIN ) 300 MG capsule Take 1 capsule (300 mg total) by mouth 3 (three) times daily.   glipiZIDE  (GLUCOTROL  XL) 10 MG 24 hr tablet TAKE 1 TABLET (10 MG TOTAL) BY MOUTH DAILY WITH BREAKFAST.   glucose blood (ONETOUCH ULTRA) test strip USE TO TEST BLOOD SUGAR TWO TIMES A DAY   hydrochlorothiazide  (HYDRODIURIL ) 25 MG tablet TAKE 1 TABLET (25 MG TOTAL) BY MOUTH DAILY.   KLOR-CON  M10 10 MEQ tablet TAKE 1 TABLET BY MOUTH EVERY DAY   Lancet Devices (ONE TOUCH DELICA LANCING DEV) MISC Use to check FSBS fasting daily. Dx: E11.59   Lancets Misc. (ACCU-CHEK FASTCLIX LANCET) KIT Use to check FSBS fasting daily. Dx: E11.59   lisinopril  (ZESTRIL ) 5 MG tablet TAKE 1 TABLET (5 MG TOTAL) BY MOUTH DAILY.   metFORMIN  (GLUCOPHAGE ) 500 MG tablet TAKE 1 TABLET BY MOUTH TWICE A DAY WITH FOOD   metoprolol  succinate (TOPROL -XL) 25 MG 24 hr tablet  TAKE 1 TABLET (25 MG TOTAL) BY MOUTH DAILY.   metoprolol  tartrate (LOPRESSOR ) 25 MG tablet Take 0.5 tablets (12.5 mg total) by mouth 2 (two) times daily.   Multiple Vitamins-Minerals (MULTI FOR HIM PO) Take by mouth. gummies with omega 3   nitroGLYCERIN  (NITROSTAT ) 0.4 MG SL tablet PLACE 1 TABLET UNDER THE TONUGE EVERY 5 MINUTES AS NEEDED FOR CHEST PAIN   OneTouch Delica Lancets 33G MISC Use to check FSBS fasting daily. Dx: E11.59   tamsulosin  (FLOMAX ) 0.4 MG CAPS capsule TAKE 1 CAPSULE BY MOUTH EVERY DAY   No facility-administered encounter medications on file as of 10/11/2023.    Past Medical History:  Diagnosis Date   Coronary artery disease    Diabetes mellitus without complication (HCC)    Myocardial infarction (HCC)    PCI/DES to pLCx, PTCA to PDA, small nondominent RCA with 95%   Occasional recreational drug use     Past Surgical History:  Procedure Laterality Date   CORONARY BALLOON ANGIOPLASTY N/A 02/05/2019   Procedure: CORONARY BALLOON ANGIOPLASTY;  Surgeon: Anner Alm ORN, MD;  Location: [facility] INVASIVE CV LAB;  Service: Cardiovascular;  Laterality: N/A;   CORONARY STENT INTERVENTION N/A 02/05/2019   Procedure: CORONARY STENT INTERVENTION;  Surgeon: Anner Alm ORN, MD;  Location: [facility] INVASIVE CV LAB;  Service: Cardiovascular;  Laterality: N/A;   CORONARY THROMBECTOMY N/A 02/05/2019  Procedure: Coronary Thrombectomy;  Surgeon: Anner Alm ORN, MD;  Location: [facility] INVASIVE CV LAB;  Service: Cardiovascular;  Laterality: N/A;   LEFT HEART CATH AND CORONARY ANGIOGRAPHY N/A 02/05/2019   Procedure: LEFT HEART CATH AND CORONARY ANGIOGRAPHY;  Surgeon: Anner Alm ORN, MD;  Location: [facility] INVASIVE CV LAB;  Service: Cardiovascular;  Laterality: N/A;    Family History  Problem Relation Age of Onset   Heart disease Mother    Diabetes Father    Breast cancer Sister    Multiple sclerosis Sister    Stomach cancer Sister    Healthy Neg Hx     Social History   Socioeconomic History    Marital status: Single    Spouse name: Not on file   Number of children: 12   Years of education: 12   Highest education level: Not on file  Occupational History   Not on file  Tobacco Use   Smoking status: Never   Smokeless tobacco: Never  Vaping Use   Vaping status: Never Used  Substance and Sexual Activity   Alcohol use: Yes    Comment: SOCIAL   Drug use: Not Currently    Types: Cocaine   Sexual activity: Not on file  Other Topics Concern   Not on file  Social History Narrative   Not on file   Social Drivers of Health   Financial Resource Strain: Low Risk  (05/10/2023)   Overall Financial Resource Strain (CARDIA)    Difficulty of Paying Living Expenses: Not hard at all  Food Insecurity: No Food Insecurity (05/10/2023)   Hunger Vital Sign    Worried About Running Out of Food in the Last Year: Never true    Ran Out of Food in the Last Year: Never true  Transportation Needs: No Transportation Needs (05/10/2023)   PRAPARE - Administrator, Civil Service (Medical): No    Lack of Transportation (Non-Medical): No  Physical Activity: Inactive (05/10/2023)   Exercise Vital Sign    Days of Exercise per Week: 0 days    Minutes of Exercise per Session: 0 min  Stress: No Stress Concern Present (05/10/2023)   [employer] of Occupational Health - Occupational Stress Questionnaire    Feeling of Stress : Not at all  Social Connections: Moderately Isolated (05/10/2023)   Social Connection and Isolation Panel    Frequency of Communication with Friends and Family: More than three times a week    Frequency of Social Gatherings with Friends and Family: Once a week    Attends Religious Services: More than 4 times per year    Active Member of [employer] or Organizations: No    Attends Banker Meetings: Never    Marital Status: Never married  Intimate Partner Violence: Not At Risk (05/10/2023)   Humiliation, Afraid, Rape, and Kick questionnaire    Fear of Current  or Ex-Partner: No    Emotionally Abused: No    Physically Abused: No    Sexually Abused: No    Review of Systems  All other systems reviewed and are negative.       Objective    BP 132/77   Pulse (!) 56   Ht 6' 4 (1.93 m)   Wt 227 lb 6.4 oz (103.1 kg)   SpO2 94%   BMI 27.68 kg/m   Physical Exam Vitals and nursing note reviewed.  Constitutional:      General: He is not in acute distress. Cardiovascular:     Rate and Rhythm:  Normal rate and regular rhythm.  Pulmonary:     Effort: Pulmonary effort is normal.     Breath sounds: Normal breath sounds.  Skin:    Comments: Right great toenail discolored with apparent ecchymosis underneath. NTTP  Neurological:     General: No focal deficit present.     Mental Status: He is alert and oriented to person, place, and time.         Assessment & Plan:   Contusion of toenail of right foot, initial encounter -     Ambulatory referral to Podiatry  Type 2 diabetes mellitus with other circulatory complication, without long-term current use of insulin  (HCC) -     Ambulatory referral to Podiatry  Other orders -     Clotrimazole -Betamethasone ; Apply 1 Application topically 2 (two) times daily.  Dispense: 15 g; Refill: 0     Return if symptoms worsen or fail to improve.   Tanda Raguel SQUIBB, MD

## 2023-10-18 ENCOUNTER — Encounter: Payer: Self-pay | Admitting: Podiatry

## 2023-10-18 ENCOUNTER — Ambulatory Visit (INDEPENDENT_AMBULATORY_CARE_PROVIDER_SITE_OTHER): Admitting: Podiatry

## 2023-10-18 DIAGNOSIS — M79672 Pain in left foot: Secondary | ICD-10-CM | POA: Diagnosis not present

## 2023-10-18 DIAGNOSIS — L600 Ingrowing nail: Secondary | ICD-10-CM | POA: Diagnosis not present

## 2023-10-18 DIAGNOSIS — B351 Tinea unguium: Secondary | ICD-10-CM

## 2023-10-18 DIAGNOSIS — M79671 Pain in right foot: Secondary | ICD-10-CM

## 2023-10-18 NOTE — Progress Notes (Signed)
 Patient presents for evaluation and treatment of tenderness and some redness around nails feet.  Tenderness around toes with walking and wearing shoes.  Physical exam:  General appearance: Alert, pleasant, and in no acute distress.  Vascular: Pedal pulses: DP 2/4 B/L, PT 2/4 B/L.  Mild to moderate edema lower legs bilaterally.  Capillary refill time immediate bilaterally  Neurological:  Normal Achilles tendon reflex.  Light touch intact  Dermatologic:  Ingrown hallux nail right with hematoma under the nail plate.  Nail is not loose to nailbed there is no swelling along the proximal nail fold or signs of infection.  Blood under the nail plate appears to be dry.  Nails thickened, disfigured, discolored 1-5 BL with subungual debris.  Redness and hypertrophic nail folds along nail folds bilaterally but no signs of drainage or infection.  Musculoskeletal:  Tenderness distal hallux right.  Hammertoes 2 through 5 bilaterally.  Hallux valgus bilaterally.  Normal muscle strength lower extremity bilaterally   Diagnosis: 1. Painful onychomycotic nails 1 through 5 bilaterally. 2. Pain toes 1 through 5 bilaterally. 3.  Ingrown nail hallux right with hematoma.  Plan: -New patient office visit with for evaluation and management level 3.  Modifier 25. - Discussed with him the subungual hematoma and ingrown nail on the hallux right recommend he soak it twice a day with an warm salt water for 15 minutes and applying antibiotic ointment if necessary.  Told him the nail should eventually grow out and come loose from the nailbed on its own however he noticed infection developing or the nail was becomes loose and irritating recommend he call us  and we can take a look at it.   -Debrided onychomycotic nails 1 through 5 bilaterally.  Return 3 months [facility]

## 2023-10-19 ENCOUNTER — Other Ambulatory Visit: Payer: Self-pay | Admitting: Family Medicine

## 2023-10-19 DIAGNOSIS — E1159 Type 2 diabetes mellitus with other circulatory complications: Secondary | ICD-10-CM

## 2023-10-29 ENCOUNTER — Other Ambulatory Visit: Payer: Self-pay | Admitting: Family Medicine

## 2023-10-29 NOTE — Telephone Encounter (Unsigned)
 Copied from CRM #[id number]. Topic: Clinical - Medication Refill >> Oct 29, 2023 11:42 AM Delon DASEN wrote: Medication: atorvastatin  (LIPITOR ) 40 MG tablet  Has the patient contacted their pharmacy? Yes (Agent: If no, request that the patient contact the pharmacy for the refill. If patient does not wish to contact the pharmacy document the reason why and proceed with request.) (Agent: If yes, when and what did the pharmacy advise?)  This is the patient's preferred pharmacy:  [phone] [address] [place] - [address] [place] [address] Phone: [phone] Fax: [phone]   Is this the correct pharmacy for this prescription? Yes If no, delete pharmacy and type the correct one.   Has the prescription been filled recently? Yes  Is the patient out of the medication? Yes  Has the patient been seen for an appointment in the last year OR does the patient have an upcoming appointment? Yes  Can we respond through MyChart? Yes  Agent: Please be advised that Rx refills may take up to 3 business days. We ask that you follow-up with your pharmacy.

## 2023-10-30 MED ORDER — ATORVASTATIN CALCIUM 40 MG PO TABS: ORAL_TABLET | ORAL | 3 refills | Status: AC

## 2023-10-30 NOTE — Telephone Encounter (Signed)
 Requested medication (s) are due for refill today: Yes  Requested medication (s) are on the active medication list: Yes  Last refill:  09/06/22  Future visit scheduled: Yes  Notes to clinic:  Last refilled by different provider.    Requested Prescriptions  Pending Prescriptions Disp Refills   atorvastatin  (LIPITOR ) 40 MG tablet 90 tablet 3    Sig: TAKE 1 TABLET BY MOUTH DAILY AT 6 PM.     Cardiovascular:  Antilipid - Statins Failed - 10/30/2023  3:10 PM      Failed - Lipid Panel in normal range within the last 12 months    Cholesterol, Total  Date Value Ref Range Status  03/13/2022 111 100 - 199 mg/dL Final   LDL Chol Calc (NIH)  Date Value Ref Range Status  03/13/2022 54 0 - 99 mg/dL Final   LDL Direct  Date Value Ref Range Status  04/13/2021 60 0 - 99 mg/dL Final   HDL  Date Value Ref Range Status  03/13/2022 42 >39 mg/dL Final   Triglycerides  Date Value Ref Range Status  03/13/2022 74 0 - 149 mg/dL Final         Passed - Patient is not pregnant      Passed - Valid encounter within last 12 months    Recent Outpatient Visits           2 weeks ago Contusion of toenail of right foot, initial encounter   [place] [facility], Raguel, MD   5 months ago Type 2 diabetes mellitus with other circulatory complication, without long-term current use of insulin  [facility])   [place] [facility], MD   8 months ago Benign prostatic hyperplasia with urinary frequency   [place] [facility], Raguel, MD   9 months ago Type 2 diabetes mellitus with other circulatory complication, without long-term current use of insulin  [facility])   [place] [facility], Raguel, MD   1 year ago Type 2 diabetes mellitus with other circulatory complication, without long-term current use of insulin  [facility])   [place] [facility], MD

## 2023-11-10 ENCOUNTER — Other Ambulatory Visit: Payer: Self-pay | Admitting: Family Medicine

## 2023-11-13 DIAGNOSIS — H1131 Conjunctival hemorrhage, right eye: Secondary | ICD-10-CM | POA: Diagnosis not present

## 2023-11-13 DIAGNOSIS — H43813 Vitreous degeneration, bilateral: Secondary | ICD-10-CM | POA: Diagnosis not present

## 2023-11-13 DIAGNOSIS — H2513 Age-related nuclear cataract, bilateral: Secondary | ICD-10-CM | POA: Diagnosis not present

## 2023-11-13 DIAGNOSIS — E119 Type 2 diabetes mellitus without complications: Secondary | ICD-10-CM | POA: Diagnosis not present

## 2023-11-25 ENCOUNTER — Other Ambulatory Visit: Payer: Self-pay | Admitting: Family Medicine

## 2023-11-28 ENCOUNTER — Other Ambulatory Visit: Payer: Self-pay | Admitting: Family Medicine

## 2023-12-28 ENCOUNTER — Other Ambulatory Visit: Payer: Self-pay | Admitting: Family Medicine

## 2024-01-17 NOTE — Progress Notes (Signed)
 Adam [name]                                          MRN: [id number]   01/17/2024   The VBCI Quality Team Specialist reviewed this patient medical record for the purposes of chart review for care gap closure. The following were reviewed: chart review for care gap closure-glycemic status assessment.    VBCI Quality Team

## 2024-01-18 ENCOUNTER — Other Ambulatory Visit: Payer: Self-pay | Admitting: Family Medicine

## 2024-01-18 DIAGNOSIS — E1159 Type 2 diabetes mellitus with other circulatory complications: Secondary | ICD-10-CM

## 2024-01-25 ENCOUNTER — Telehealth: Payer: Self-pay | Admitting: Cardiovascular Disease

## 2024-01-25 MED ORDER — CLOPIDOGREL BISULFATE 75 MG PO TABS: 75.0000 mg | ORAL_TABLET | Freq: Every day | ORAL | 0 refills | Status: DC

## 2024-01-25 NOTE — Telephone Encounter (Signed)
*  STAT* If patient is at the pharmacy, call can be transferred to refill team.   1. Which medications need to be refilled? (please list name of each medication and dose if known)   clopidogrel  (PLAVIX ) 75 MG tablet    2. Which pharmacy/location (including street and city if local pharmacy) is medication to be sent to?  [id number] [place], [place] - [address]    3. Do they need a 30 day or 90 day supply? 90  Patient is out of medication

## 2024-01-28 ENCOUNTER — Encounter: Payer: Self-pay | Admitting: Radiology

## 2024-01-28 NOTE — Telephone Encounter (Signed)
 Refill already sent on 01/25/24 # 30, per chart.

## 2024-02-04 ENCOUNTER — Telehealth: Payer: Self-pay

## 2024-02-04 NOTE — Telephone Encounter (Signed)
 Copied from CRM [id number]. Topic: Clinical - Medical Advice >> Feb 04, 2024 10:07 AM Pinkey ORN wrote: Reason for CRM: Medical Advice >> Feb 04, 2024 10:10 AM Pinkey ORN wrote: Patient fiance's Corean is requesting that Tanda Bleacher, MD reaches out to patient and advise that he doesn't pull off the toenail from the area where patient previously dropped an object on it. Please follow up with patient directly.

## 2024-02-07 NOTE — Telephone Encounter (Unsigned)
 Copied from CRM #[id number]. Topic: Clinical - Medication Refill >> Feb 07, 2024 11:18 AM Olam RAMAN wrote: Medication:  lisinopril  (ZESTRIL ) 5 MG tablet  Has the patient contacted their pharmacy? Yes (Agent: If no, request that the patient contact the pharmacy for the refill. If patient does not wish to contact the pharmacy document the reason why and proceed with request.) (Agent: If yes, when and what did the pharmacy advise?)  This is the patient's preferred pharmacy:  [id number] [address], [place] - [address] [place] [address] Phone: [phone] Fax: [phone]  [id number] [place], [place] - [address] [place] [address] Phone: [phone] Fax: [phone]  Is this the correct pharmacy for this prescription? Yes If no, delete pharmacy and type the correct one.   Has the prescription been filled recently? No  Is the patient out of the medication? Yes  Has the patient been seen for an appointment in the last year OR does the patient have an upcoming appointment? Yes  Can we respond through MyChart? Yes  Agent: Please be advised that Rx refills may take up to 3 business days. We ask that you follow-up with your pharmacy.

## 2024-02-09 ENCOUNTER — Other Ambulatory Visit: Payer: Self-pay | Admitting: Family Medicine

## 2024-02-20 ENCOUNTER — Other Ambulatory Visit: Payer: Self-pay | Admitting: Cardiovascular Disease

## 2024-02-20 ENCOUNTER — Other Ambulatory Visit: Payer: Self-pay | Admitting: Family Medicine

## 2024-02-20 NOTE — Progress Notes (Signed)
 DIMITRI SHAKESPEARE                                          MRN: [id number]   02/20/2024   The VBCI Quality Team Specialist reviewed this patient medical record for the purposes of chart review for care gap closure. The following were reviewed: chart review for care gap closure-kidney health evaluation for diabetes:eGFR  and uACR.    VBCI Quality Team

## 2024-02-20 NOTE — Telephone Encounter (Signed)
 Copied from CRM #[id number]. Topic: Clinical - Medication Refill >> Feb 20, 2024 10:31 AM Victoria B wrote: Medication: clopidogrel  (PLAVIX ) 75 MG tablet  Has the patient contacted their pharmacy? yes (Agent: If no, request that the patient contact the pharmacy for the refill. If patient does not wish to contact the pharmacy document the reason why and proceed with request.) (Agent: If yes, when and what did the pharmacy advise?)contact pcp  This is the patient's preferred pharmacy:  [phone] [address], [place] - [address] [place] [address] Phone: [phone] Fax: [phone]  Is this the correct pharmacy for this prescription? yes  Has the prescription been filled recently? no  Is the patient out of the medication? no  Has the patient been seen for an appointment in the last year OR does the patient have an upcoming appointment? yes  Can we respond through MyChart? yes  Agent: Please be advised that Rx refills may take up to 3 business days. We ask that you follow-up with your pharmacy.

## 2024-02-25 NOTE — Telephone Encounter (Signed)
 Requested medication (s) are due for refill today: yes  Requested medication (s) are on the active medication list: yes  Last refill:  02/20/24  Future visit scheduled: yes  Notes to clinic:  Unable to refill per protocol, last refill by another provider.      Requested Prescriptions  Pending Prescriptions Disp Refills   clopidogrel  (PLAVIX ) 75 MG tablet 30 tablet 0    Sig: Take 1 tablet (75 mg total) by mouth daily.     Hematology: Antiplatelets - clopidogrel  Failed - 02/25/2024  1:50 PM      Failed - HCT in normal range and within 180 days    Hematocrit  Date Value Ref Range Status  08/02/2020 39.4 37.5 - 51.0 % Final         Failed - HGB in normal range and within 180 days    Hemoglobin  Date Value Ref Range Status  08/02/2020 12.6 (L) 13.0 - 17.7 g/dL Final         Failed - PLT in normal range and within 180 days    Platelets  Date Value Ref Range Status  08/02/2020 181 150 - 450 x10E3/uL Final         Failed - Cr in normal range and within 360 days    Creatinine, Ser  Date Value Ref Range Status  03/13/2022 1.58 (H) 0.76 - 1.27 mg/dL Final         Failed - Valid encounter within last 6 months    Recent Outpatient Visits           4 months ago Contusion of toenail of right foot, initial encounter   [place] [facility], Raguel, MD   9 months ago Type 2 diabetes mellitus with other circulatory complication, without long-term current use of insulin  [facility])   [place] [facility], MD   1 year ago Benign prostatic hyperplasia with urinary frequency   [place] [facility], Raguel, MD   1 year ago Type 2 diabetes mellitus with other circulatory complication, without long-term current use of insulin  [facility])   [place] [facility], Raguel, MD   1 year ago Type 2 diabetes mellitus with other circulatory complication, without long-term current use of  insulin  [facility])   [place] [facility], MD

## 2024-02-27 NOTE — Telephone Encounter (Signed)
 Left detailed message (OK per DPR) for patient with recommendation from Dr. Wonda to finish current supply of Plavix , then once finished discontinue and continue on ASA 81 mg daily.  Provided office number for callback if any questions.

## 2024-03-03 ENCOUNTER — Telehealth: Payer: Self-pay

## 2024-03-03 NOTE — Telephone Encounter (Signed)
 Copied from CRM #[id number]. Topic: Clinical - Medication Question >> Mar 03, 2024 12:44 PM Olam RAMAN wrote: Reason for CRM: wants to make sure new meds are ok to take for his heart. CB [phone] They ([facility]) told him he didn't need to take this medication anymore clopidogrel  (PLAVIX ) 75 MG tablet

## 2024-03-03 NOTE — Telephone Encounter (Signed)
 Please advise

## 2024-03-06 ENCOUNTER — Telehealth: Payer: Self-pay

## 2024-03-06 NOTE — Telephone Encounter (Signed)
 Copied from CRM #[id number]. Topic: Clinical - Medication Question >> Mar 03, 2024 12:44 PM Adam [name] wrote: Reason for CRM: wants to make sure new meds are ok to take for his heart. CB [phone] They ([facility]) told him he didn't need to take this medication anymore clopidogrel  (PLAVIX ) 75 MG tablet >> Mar 04, 2024  9:46 AM Emylou G wrote: Pls call patient see attached below.. But use this number: [phone]

## 2024-03-08 ENCOUNTER — Other Ambulatory Visit: Payer: Self-pay | Admitting: Family Medicine

## 2024-03-11 NOTE — Telephone Encounter (Signed)
 Yes he is at low risk of stopping plavix , >3 years out from his heart attack/stent procedure. He should continue on ASA 81 mg daily. thanks

## 2024-03-11 NOTE — Telephone Encounter (Signed)
 Requested Prescriptions  Pending Prescriptions Disp Refills   tamsulosin  (FLOMAX ) 0.4 MG CAPS capsule [Pharmacy Med Name: TAMSULOSIN  HCL 0.4 MG CAPSULE] 90 capsule 0    Sig: TAKE 1 CAPSULE BY MOUTH EVERY DAY     Urology: Alpha-Adrenergic Blocker Failed - 03/11/2024 10:01 AM      Failed - PSA in normal range and within 360 days    No results found for: LABPSA, PSA, PSA1, ULTRAPSA       Passed - Last BP in normal range    BP Readings from Last 1 Encounters:  10/11/23 132/77         Passed - Valid encounter within last 12 months    Recent Outpatient Visits           5 months ago Contusion of toenail of right foot, initial encounter   [place] [facility], Raguel, MD   10 months ago Type 2 diabetes mellitus with other circulatory complication, without long-term current use of insulin  [facility])   [place] [facility], MD   1 year ago Benign prostatic hyperplasia with urinary frequency   [place] [facility], Raguel, MD   1 year ago Type 2 diabetes mellitus with other circulatory complication, without long-term current use of insulin  [facility])   [place] [facility], Raguel, MD   1 year ago Type 2 diabetes mellitus with other circulatory complication, without long-term current use of insulin  [facility])   [place] [facility], MD

## 2024-03-19 NOTE — Telephone Encounter (Signed)
 Spoke with pt and let him know he can stop Plavix  and just continue Aspirin  81 mg once daily. Pt verbalized understanding and thanked us  for letting him know. No further questions or concerns at this time.

## 2024-04-02 ENCOUNTER — Other Ambulatory Visit: Payer: Self-pay | Admitting: Family Medicine

## 2024-04-02 NOTE — Telephone Encounter (Signed)
 Copied from CRM #[id number]. Topic: Clinical - Medication Refill >> Apr 02, 2024  9:23 AM Vena HERO wrote: Medication: hydrochlorothiazide  (HYDRODIURIL ) 25 MG tablet, KLOR-CON  M10 10 MEQ tablet, metoprolol  succinate (TOPROL -XL) 25 MG 24 hr tablet  Has the patient contacted their pharmacy? No (Agent: If no, request that the patient contact the pharmacy for the refill. If patient does not wish to contact the pharmacy document the reason why and proceed with request.) (Agent: If yes, when and what did the pharmacy advise?)  This is the patient's preferred pharmacy:  [phone] [address] [place] - [address] [place] [address] Phone: [phone] Fax: [phone]   Is this the correct pharmacy for this prescription? Yes If no, delete pharmacy and type the correct one.   Has the prescription been filled recently? No  Is the patient out of the medication? No  Has the patient been seen for an appointment in the last year OR does the patient have an upcoming appointment? Yes  Can we respond through MyChart? Yes  Agent: Please be advised that Rx refills may take up to 3 business days. We ask that you follow-up with your pharmacy.

## 2024-04-10 ENCOUNTER — Telehealth: Payer: Self-pay | Admitting: Cardiovascular Disease

## 2024-04-10 NOTE — Telephone Encounter (Signed)
" °*  STAT* If patient is at the pharmacy, call can be transferred to refill team.   1. Which medications need to be refilled? (please list name of each medication and dose if known)   hydrochlorothiazide  (HYDRODIURIL ) 25 MG tablet  potassium chloride  (KLOR-CON  M10) 10 MEQ tablet [[id number]]    2. Would you like to learn more about the convenience, safety, & potential cost savings by using the [facility]? No    3. Are you open to using the [facility] (Type [facility]. No    4. Which pharmacy/location (including street and city if local pharmacy) is medication to be sent to? [id number] [place], [place] - [address]     5. Do they need a 30 day or 90 day supply? 90 day   Pt is requesting refill of potassium. Rx shows as discontinued.  "

## 2024-04-16 MED ORDER — HYDROCHLOROTHIAZIDE 25 MG PO TABS: 25.0000 mg | ORAL_TABLET | Freq: Every day | ORAL | 1 refills | Status: AC

## 2024-04-16 MED ORDER — KLOR-CON M10 10 MEQ PO TBCR: 10.0000 meq | EXTENDED_RELEASE_TABLET | Freq: Every day | ORAL | 1 refills | Status: AC

## 2024-04-16 NOTE — Telephone Encounter (Signed)
 Pt's medications were sent to pt's pharmacy as requested. Confirmation received.

## 2024-04-27 ENCOUNTER — Other Ambulatory Visit: Payer: Self-pay | Admitting: Family Medicine

## 2024-04-27 DIAGNOSIS — E1159 Type 2 diabetes mellitus with other circulatory complications: Secondary | ICD-10-CM

## 2024-04-28 ENCOUNTER — Other Ambulatory Visit: Payer: Self-pay | Admitting: Family Medicine

## 2024-04-28 DIAGNOSIS — E1159 Type 2 diabetes mellitus with other circulatory complications: Secondary | ICD-10-CM

## 2024-04-28 NOTE — Telephone Encounter (Unsigned)
 Copied from CRM #[id number]. Topic: Clinical - Medication Refill >> Apr 28, 2024  8:23 AM Macario HERO wrote: Medication: glipiZIDE  (GLUCOTROL  XL) 10 MG 24 hr tablet [[id number]] lisinopril  (ZESTRIL ) 5 MG tablet [[id number]] hydrochlorothiazide  (HYDRODIURIL ) 25 MG tablet [[id number]]  Has the patient contacted their pharmacy? No (Agent: If no, request that the patient contact the pharmacy for the refill. If patient does not wish to contact the pharmacy document the reason why and proceed with request.) (Agent: If yes, when and what did the pharmacy advise?)  This is the patient's preferred pharmacy:  [phone] [address], [place] - [address] [place] [address] Phone: [phone] Fax: [phone]   Is this the correct pharmacy for this prescription? Yes If no, delete pharmacy and type the correct one.   Has the prescription been filled recently? Yes  Is the patient out of the medication? Yes  Has the patient been seen for an appointment in the last year OR does the patient have an upcoming appointment? Yes  Can we respond through MyChart? Yes  Agent: Please be advised that Rx refills may take up to 3 business days. We ask that you follow-up with your pharmacy.

## 2024-04-29 ENCOUNTER — Other Ambulatory Visit: Payer: Self-pay | Admitting: Family Medicine

## 2024-04-29 MED ORDER — GLIPIZIDE ER 10 MG PO TB24: 10.0000 mg | ORAL_TABLET | Freq: Every day | ORAL | 0 refills | Status: AC

## 2024-04-29 MED ORDER — LISINOPRIL 5 MG PO TABS: 5.0000 mg | ORAL_TABLET | Freq: Every day | ORAL | 0 refills | Status: AC

## 2024-04-29 NOTE — Telephone Encounter (Incomplete)
 Requested Prescriptions  Pending Prescriptions Disp Refills   glipiZIDE  (GLUCOTROL  XL) 10 MG 24 hr tablet 30 tablet 0    Sig: Take 1 tablet (10 mg total) by mouth daily with breakfast.     Endocrinology:  Diabetes - Sulfonylureas Failed - 04/29/2024  1:41 PM      Failed - HBA1C is between 0 and 7.9 and within 180 days    Hemoglobin A1C  Date Value Ref Range Status  01/05/2023 5.6 4.0 - 5.6 % Final   Hgb A1c MFr Bld  Date Value Ref Range Status  08/02/2020 6.3 (H) 4.8 - 5.6 % Final    Comment:             Prediabetes: 5.7 - 6.4          Diabetes: >6.4          Glycemic control for adults with diabetes: <7.0          Failed - Cr in normal range and within 360 days    Creatinine, Ser  Date Value Ref Range Status  03/13/2022 1.58 (H) 0.76 - 1.27 mg/dL Final         Failed - Valid encounter within last 6 months    Recent Outpatient Visits           6 months ago Contusion of toenail of right foot, initial encounter   [facility], [place], [place]   11 months ago Type 2 diabetes mellitus with other circulatory complication, without long-term current use of insulin  (HCC)   [facility] [facility], MD   1 year ago Benign prostatic hyperplasia with urinary frequency   [place] [facility], [place], [place]   1 year ago Type 2 diabetes mellitus with other circulatory complication, without long-term current use of insulin  [facility])   [place] [facility], [place], [place]   1 year ago Type 2 diabetes mellitus with other circulatory complication, without long-term current use of insulin  [place])   [place] [facility], MD       Future Appointments             In 3 weeks Rana Lum CROME, NP [facility] Dept of [employer]. [facility], [facility]             lisinopril  (ZESTRIL ) 5 MG tablet 30 tablet 0    Sig: Take 1 tablet  (5 mg total) by mouth daily.     Cardiovascular:  ACE Inhibitors Failed - 04/29/2024  1:41 PM      Failed - Cr in normal range and within 180 days    Creatinine, Ser  Date Value Ref Range Status  03/13/2022 1.58 (H) 0.76 - 1.27 mg/dL Final         Failed - K in normal range and within 180 days    Potassium  Date Value Ref Range Status  03/13/2022 4.5 3.5 - 5.2 mmol/L Final         Failed - Valid encounter within last 6 months    Recent Outpatient Visits           6 months ago Contusion of toenail of right foot, initial encounter   [place] [facility], [place], [place]   11 months ago Type 2 diabetes mellitus with other circulatory complication, without long-term current use  of insulin  [facility])   [place] [facility] MD   1 year ago Benign prostatic hyperplasia with urinary frequency   [place] [facility], [place], MD   1 year ago Type 2 diabetes mellitus with other circulatory complication, without long-term current use of insulin  [facility])   [place] [facility], [place], [place]   1 year ago Type 2 diabetes mellitus with other circulatory complication, without long-term current use of insulin  [place])   [place] [facility], MD       Future Appointments             In 3 weeks Rana Lum CROME, NP [facility] at [facility] Dept of [employer]. [facility], [facility]            Passed - Patient is not pregnant      Passed - Last BP in normal range    BP Readings from Last 1 Encounters:  10/11/23 132/77         Refused Prescriptions Disp Refills   hydrochlorothiazide  (HYDRODIURIL ) 25 MG tablet 30 tablet 1    Sig: Take 1 tablet (25 mg total) by mouth daily.     Cardiovascular: Diuretics - Thiazide Failed - 04/29/2024  1:41 PM      Failed - Cr in normal range and within 180 days    Creatinine, Ser  Date Value Ref Range Status   03/13/2022 1.58 (H) 0.76 - 1.27 mg/dL Final         Failed - K in normal range and within 180 days    Potassium  Date Value Ref Range Status  03/13/2022 4.5 3.5 - 5.2 mmol/L Final         Failed - Na in normal range and within 180 days    Sodium  Date Value Ref Range Status  03/13/2022 141 134 - 144 mmol/L Final         Failed - Valid encounter within last 6 months    Recent Outpatient Visits           6 months ago Contusion of toenail of right foot, initial encounter   [facility], [place], [place]   11 months ago Type 2 diabetes mellitus with other circulatory complication, without long-term current use of insulin  [facility])   [place] [facility], MD   1 year ago Benign prostatic hyperplasia with urinary frequency   [place] [facility], [place], MD   1 year ago Type 2 diabetes mellitus with other circulatory complication, without long-term current use of insulin  [facility])   [place] [facility], [place], [place]   1 year ago Type 2 diabetes mellitus with other circulatory complication, without long-term current use of insulin  [facility])   [place] [facility], MD       Future Appointments             In 3 weeks Rana Lum CROME, NP [facility] at [facility] Dept of [employer]. [facility], [facility]            Passed - Last BP in normal range    BP Readings from Last 1 Encounters:  10/11/23 132/77

## 2024-05-20 ENCOUNTER — Ambulatory Visit: Admitting: Emergency Medicine

## 2024-05-22 ENCOUNTER — Ambulatory Visit: Payer: BC Managed Care – PPO
